# Patient Record
Sex: Female | Born: 1995
Health system: Southern US, Community
[De-identification: ages and names within clinical notes are randomized; demographics above are authoritative.]

## PROBLEM LIST (undated history)

## (undated) ENCOUNTER — Inpatient Hospital Stay (HOSPITAL_COMMUNITY): Payer: Self-pay

## (undated) DIAGNOSIS — R519 Headache, unspecified: Secondary | ICD-10-CM

## (undated) DIAGNOSIS — E1110 Type 2 diabetes mellitus with ketoacidosis without coma: Secondary | ICD-10-CM

## (undated) DIAGNOSIS — E1065 Type 1 diabetes mellitus with hyperglycemia: Secondary | ICD-10-CM

## (undated) DIAGNOSIS — E109 Type 1 diabetes mellitus without complications: Secondary | ICD-10-CM

## (undated) DIAGNOSIS — I10 Essential (primary) hypertension: Secondary | ICD-10-CM

## (undated) DIAGNOSIS — F419 Anxiety disorder, unspecified: Secondary | ICD-10-CM

## (undated) DIAGNOSIS — F32A Depression, unspecified: Secondary | ICD-10-CM

## (undated) HISTORY — DX: Essential (primary) hypertension: I10

## (undated) HISTORY — PX: WISDOM TOOTH EXTRACTION: SHX21

---
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
## 2018-05-15 ENCOUNTER — Inpatient Hospital Stay (EMERGENCY_DEPARTMENT_HOSPITAL)
Admission: AD | Admit: 2018-05-15 | Discharge: 2018-05-15 | Disposition: A | Discharge status: Home / Self Care | Payer: Medicaid Other | Source: Ambulatory Visit | Attending: Obstetrics & Gynecology | Admitting: Obstetrics & Gynecology

## 2018-05-15 ENCOUNTER — Emergency Department (HOSPITAL_COMMUNITY)
Admission: EM | Admit: 2018-05-15 | Discharge: 2018-05-15 | Disposition: A | Discharge status: Home / Self Care | Payer: Medicaid Other | Attending: Emergency Medicine | Admitting: Emergency Medicine

## 2018-05-15 ENCOUNTER — Other Ambulatory Visit: Payer: Self-pay

## 2018-05-15 ENCOUNTER — Encounter (HOSPITAL_COMMUNITY): Payer: Self-pay | Admitting: *Deleted

## 2018-05-15 ENCOUNTER — Encounter (HOSPITAL_COMMUNITY): Payer: Self-pay | Admitting: Emergency Medicine

## 2018-05-15 DIAGNOSIS — Z5321 Procedure and treatment not carried out due to patient leaving prior to being seen by health care provider: Secondary | ICD-10-CM | POA: Insufficient documentation

## 2018-05-15 DIAGNOSIS — O9989 Other specified diseases and conditions complicating pregnancy, childbirth and the puerperium: Secondary | ICD-10-CM

## 2018-05-15 DIAGNOSIS — J029 Acute pharyngitis, unspecified: Secondary | ICD-10-CM | POA: Insufficient documentation

## 2018-05-15 DIAGNOSIS — R05 Cough: Secondary | ICD-10-CM

## 2018-05-15 DIAGNOSIS — Z794 Long term (current) use of insulin: Secondary | ICD-10-CM

## 2018-05-15 DIAGNOSIS — E109 Type 1 diabetes mellitus without complications: Secondary | ICD-10-CM | POA: Insufficient documentation

## 2018-05-15 DIAGNOSIS — O99513 Diseases of the respiratory system complicating pregnancy, third trimester: Secondary | ICD-10-CM | POA: Insufficient documentation

## 2018-05-15 DIAGNOSIS — B9789 Other viral agents as the cause of diseases classified elsewhere: Secondary | ICD-10-CM

## 2018-05-15 DIAGNOSIS — O24013 Pre-existing diabetes mellitus, type 1, in pregnancy, third trimester: Secondary | ICD-10-CM

## 2018-05-15 DIAGNOSIS — Z3A29 29 weeks gestation of pregnancy: Secondary | ICD-10-CM | POA: Insufficient documentation

## 2018-05-15 DIAGNOSIS — J069 Acute upper respiratory infection, unspecified: Secondary | ICD-10-CM | POA: Diagnosis not present

## 2018-05-15 HISTORY — DX: Type 1 diabetes mellitus without complications: E10.9

## 2018-05-15 MED ORDER — GUAIFENESIN 200 MG PO TABS: 200.0000 mg | ORAL_TABLET | ORAL | 0 refills | Status: DC | PRN

## 2018-05-15 MED ORDER — FLUTICASONE PROPIONATE 50 MCG/ACT NA SUSP: 2.0000 | Freq: Every day | NASAL | 0 refills | Status: DC

## 2018-05-15 NOTE — ED Notes (Signed)
Called pt x2 for vitals, no response. °

## 2018-05-15 NOTE — MAU Note (Signed)
PT SAYS  SHE HAS BAD COUGH - STARTED LAST NIGHT-  WITH MUCUS- THEN VOMITED. ONLY  HAPPENS AT NIGHT  HAD SORE THROAT.     SHE MOVED FROM- [place] ON 8[place]11- [facility]- [facility] - DID NOT BRING RECORDS    SHE WENT TO [facility] AT 0100- VS- THEN SHE LEFT  [facility] AFTER TRIAGE.    [facility]

## 2018-05-15 NOTE — Discharge Instructions (Signed)
I think your cough, sore throat, and mucus are related to a viral URI. I have prescribed Guaifenesin which helps to thin out mucus production and Flonase, a nasal spray, that helps with congestion and dripping of mucus down your throat. Other medications that you can try that are safe in pregnancy are alcohol free Robitussin cough syrup, benadryl, claritin. I also recommend cough drops or numbing sprays for the back of your throat. Make sure you drink plenty of water.   Please return if you have fevers, worsening of your cough, cough lasting more than 4 weeks, or were feeling better and then all of the sudden feel a lot worse.

## 2018-05-15 NOTE — MAU Provider Note (Signed)
  History     CSN: [id number]  Arrival date and time: 05/15/18 [id number]   None     Chief Complaint  Patient presents with  . Cough   HPI   Megan Silva is a 22 y.o. G1P0 female at 5754w4d who presents to MAU with cough. Cough started 2 nights ago. Reports that cough is non-productive but feels like there is mucus in her throat she can't get up. Denies fevers, trouble breathing, wheezing. Endorses sore throat and nasal congestion. No known sick contacts. Has only taken Tylenol because she was unsure what else she could take in pregnancy.   [place] Pregnancy complicated by T1DM on insulin. She has a new OB visit scheduled.    OB History    Gravida  1   Para      Term      Preterm      AB      Living        SAB      TAB      Ectopic      Multiple      Live Births              Past Medical History:  Diagnosis Date  . Diabetes type 1, controlled (HCC)     History reviewed. No pertinent surgical history.  History reviewed. No pertinent family history.  Social History   Tobacco Use  . Smoking status: Never Smoker  . Smokeless tobacco: Never Used  Substance Use Topics  . Alcohol use: Never    Frequency: Never  . Drug use: Never    Allergies: No Known Allergies  No medications prior to admission.    Review of Systems Physical Exam   Blood pressure 134/82, pulse 99, temperature 98 F (36.7 C), temperature source Oral, resp. rate 20, height 5\' 7"  (1.702 m), weight 83.7 kg, SpO2 100 %.  Physical Exam  Constitutional: She is oriented to person, place, and time. She appears well-developed and well-nourished. No distress.  HENT:  Head: Normocephalic and atraumatic.  Oropharynx mildly erythematous but without tonsillar exudate or hypertrophy. TMs normal bilaterally. No rhinorrhea present.   Eyes: Conjunctivae and EOM are normal.  Neck: Normal range of motion.  Cardiovascular: Normal rate, regular rhythm and normal heart  sounds.  Respiratory: Effort normal and breath sounds normal. No respiratory distress. She has no wheezes. She has no rales.  No cough overheard during visit.   Musculoskeletal: Normal range of motion.  Neurological: She is alert and oriented to person, place, and time.  Skin: Skin is warm and dry.  Psychiatric: She has a normal mood and affect. Her behavior is normal.  FHT: 140 bpm, mod variability, +acels, no decels  Toco: Uterine irritability    MAU Course  Procedures  MDM NST reactive    Assessment and Plan   1. Viral URI with cough   Symptoms and presentation consistent with mild viral URI. Lungs clear; cough likely related to postnasal drip. No evidence of strep pharyngitis on exam. Have given prescription for Guaifenesin and Flonase. Discussed other medications appropriate for symptoms in pregnancy. Reviewed return precautions for secondary bacterial infection.      De HollingsheadCatherine L Kourtlyn Charlet 05/15/2018, 8:19 AM

## 2018-05-15 NOTE — ED Notes (Signed)
Pt at [facility], will elope

## 2018-05-15 NOTE — ED Triage Notes (Signed)
Pt reports a productive cough with yellow/green mucous that started last night. Pt reports having a sore throat over the last few days that has gotten better. Pt denies sob and denies pain. Pt is [redacted] weeks pregnant.

## 2018-05-27 ENCOUNTER — Other Ambulatory Visit: Payer: Self-pay | Admitting: Obstetrics and Gynecology

## 2018-05-27 ENCOUNTER — Other Ambulatory Visit: Payer: Self-pay | Admitting: Obstetrics

## 2018-05-27 ENCOUNTER — Encounter: Payer: Self-pay | Admitting: Obstetrics and Gynecology

## 2018-05-27 ENCOUNTER — Ambulatory Visit (INDEPENDENT_AMBULATORY_CARE_PROVIDER_SITE_OTHER): Payer: Medicaid Other | Admitting: Obstetrics and Gynecology

## 2018-05-27 DIAGNOSIS — R8271 Bacteriuria: Secondary | ICD-10-CM | POA: Insufficient documentation

## 2018-05-27 DIAGNOSIS — Z23 Encounter for immunization: Secondary | ICD-10-CM

## 2018-05-27 DIAGNOSIS — O24913 Unspecified diabetes mellitus in pregnancy, third trimester: Secondary | ICD-10-CM

## 2018-05-27 DIAGNOSIS — O0993 Supervision of high risk pregnancy, unspecified, third trimester: Secondary | ICD-10-CM | POA: Diagnosis not present

## 2018-05-27 DIAGNOSIS — O24919 Unspecified diabetes mellitus in pregnancy, unspecified trimester: Secondary | ICD-10-CM | POA: Insufficient documentation

## 2018-05-27 DIAGNOSIS — O24013 Pre-existing diabetes mellitus, type 1, in pregnancy, third trimester: Secondary | ICD-10-CM | POA: Diagnosis not present

## 2018-05-27 DIAGNOSIS — O99824 Streptococcus B carrier state complicating childbirth: Secondary | ICD-10-CM

## 2018-05-27 HISTORY — DX: Bacteriuria: R82.71

## 2018-05-27 LAB — COMPREHENSIVE METABOLIC PANEL
A/G RATIO: 1.2 (ref 1.2–2.2)
ALBUMIN: 3.4 g/dL — AB (ref 3.5–5.5)
ALT: 11 IU/L (ref 0–32)
AST: 15 IU/L (ref 0–40)
Alkaline Phosphatase: 90 IU/L (ref 39–117)
BILIRUBIN TOTAL: 0.3 mg/dL (ref 0.0–1.2)
BUN / CREAT RATIO: 11 (ref 9–23)
BUN: 7 mg/dL (ref 6–20)
CALCIUM: 9.2 mg/dL (ref 8.7–10.2)
CHLORIDE: 100 mmol/L (ref 96–106)
CO2: 22 mmol/L (ref 20–29)
Creatinine, Ser: 0.63 mg/dL (ref 0.57–1.00)
GFR, EST AFRICAN AMERICAN: 149 mL/min/{1.73_m2} (ref >59)
GFR, EST NON AFRICAN AMERICAN: 130 mL/min/{1.73_m2} (ref >59)
GLOBULIN, TOTAL: 2.9 g/dL (ref 1.5–4.5)
Glucose: 253 mg/dL — ABNORMAL HIGH (ref 65–99)
POTASSIUM: 4.4 mmol/L (ref 3.5–5.2)
Sodium: 136 mmol/L (ref 134–144)
TOTAL PROTEIN: 6.3 g/dL (ref 6.0–8.5)

## 2018-05-27 LAB — POCT URINALYSIS DIP (DEVICE)
BILIRUBIN URINE: NEGATIVE
Glucose, UA: 500 mg/dL — AB
HGB URINE DIPSTICK: NEGATIVE
Ketones, ur: NEGATIVE mg/dL
LEUKOCYTES UA: NEGATIVE
Nitrite: NEGATIVE
Protein, ur: 30 mg/dL — AB
Specific Gravity, Urine: 1.025 (ref 1.005–1.030)
Urobilinogen, UA: 1 mg/dL (ref 0.0–1.0)
pH: 6.5 (ref 5.0–8.0)

## 2018-05-27 LAB — HEMOGLOBIN A1C
Est. average glucose Bld gHb Est-mCnc: 206 mg/dL
Hgb A1c MFr Bld: 8.8 % — ABNORMAL HIGH (ref 4.8–5.6)

## 2018-05-27 MED ORDER — INSULIN LISPRO 100 UNIT/ML ~~LOC~~ SOLN: 14.0000 [IU] | Freq: Three times a day (TID) | SUBCUTANEOUS | 5 refills | Status: DC

## 2018-05-27 MED ORDER — "INSULIN SYRINGE 31G X 5/16"" 0.3 ML MISC": 1.0000 | Freq: Every day | 3 refills | Status: DC

## 2018-05-27 MED ORDER — INSULIN GLARGINE 100 UNIT/ML ~~LOC~~ SOLN: 36.0000 [IU] | Freq: Two times a day (BID) | SUBCUTANEOUS | 5 refills | Status: DC

## 2018-05-27 NOTE — Telephone Encounter (Signed)
Refills requested for insulin, but she has 5 refills from Dr. Rande Lawman.  Call patient and let her know.

## 2018-05-27 NOTE — Progress Notes (Signed)
Subjective:  Megan Silva is a 22 y.o. G1P0000 at 62w2dbeing seen today for ongoing prenatal care.[place] No prenatal care the month of August. Prenatal course complicated by Type 1 DM. Dx at age 22 Pt reports at least 5 hospitalization for glucose control thus far in pregnancy. She reports taking her insulin and following diet. Also H/O + GBS urine and abnormal genetic screening test for increased DS risk. Pt does not recall any specifics but reports declining genetic counseling. She reports having had a fetal ECHO and says was WNL.  Prenatal records are not available presently.   She is currently monitored for the following issues for this high-risk pregnancy and has Supervision of high risk pregnancy, antepartum, third trimester; Diabetes mellitus complicating pregnancy; and GBS bacteriuria on their problem list.  Patient reports no complaints.  Contractions: Not present. Vag. Bleeding: None.  Movement: Present. Denies leaking of fluid.   The following portions of the patient's history were reviewed and updated as appropriate: allergies, current medications, past family history, past medical history, past social history, past surgical history and problem list. Problem list updated.  Objective:   Vitals:   05/27/18 0951  BP: 126/65  Pulse: (!) 101  Weight: 183 lb (83 kg)    Fetal Status: Fetal Heart Rate (bpm): 150   Movement: Present     General:  Alert, oriented and cooperative. Patient is in no acute distress.  Skin: Skin is warm and dry. No rash noted.   Cardiovascular: Normal heart rate noted  Respiratory: Normal respiratory effort, no problems with respiration noted  Abdomen: Soft, gravid, appropriate for gestational age. Pain/Pressure: Present     Pelvic:  Cervical exam deferred        Extremities: Normal range of motion.  Edema: None  Mental Status: Normal mood and affect. Normal behavior. Normal judgment and thought content.    Urinalysis:      Assessment and Plan:  Pregnancy: G1P0000 at 34w2d1. Supervision of high risk pregnancy, antepartum, third trimester Will have pt sign for release of medical records. - Tdap vaccine greater than or equal to 7yo IM - Flu Vaccine QUAD 36+ mos IM  2. Diabetes mellitus affecting pregnancy in third trimester DM and pregnancy reviewed with pt. Importance for glycemic control reviewed with pt and that glycemic control deceases risks associated with DM and pregnancy Review of CBG's demonstrated not in goal range. Will increase insulin therapy as note in med list. Insulin and glucose supplies refilled. Offered DM education/nutrition but pt declined Antenatal testing discussed and indications reviewed Will begin twice weekly testing with BBP/NST Will also check growth U?S and additional labs ordered today Will continue with BASA qd.  - POCT urinalysis dip (device) - Comp Met (CMET) - Protein / creatinine ratio, urine - [place] OB DETAIL +14 WK; Future - [place] FETAL BPP WO NON STRESS; Future - Hemoglobin A1c - insulin glargine (LANTUS) 100 UNIT/ML injection; Inject 0.36 mLs (36 Units total) into the skin 2 (two) times daily.  Dispense: 10 mL; Refill: 5 - insulin lispro (HUMALOG) 100 UNIT/ML injection; Inject 0.14 mLs (14 Units total) into the skin 3 (three) times daily before meals.  Dispense: 10 mL; Refill: 5  3. GBS bacteriuria TX while in labor  Preterm labor symptoms and general obstetric precautions including but not limited to vaginal bleeding, contractions, leaking of fluid and fetal movement were reviewed in detail with the patient. Please refer to  After Visit Summary for other counseling recommendations.  Return in about 1 week (around 06/03/2018) for OB visit.   Chancy Milroy, MD

## 2018-05-27 NOTE — Addendum Note (Signed)
Addended by: Kathee Delton on: 05/27/2018 01:16 PM   Modules accepted: Orders

## 2018-05-27 NOTE — BH Specialist Note (Deleted)
Integrated Behavioral Health Initial Visit  MRN: [id number] Name: Megan Silva  Number of Integrated Behavioral Health Clinician visits:: 1/6 Session Start time: ***  Session End time: *** Total time: {IBH Total Time:21014050}  Type of Service: Integrated Behavioral Health- Individual/Family Interpretor:No. Interpretor Name and Language: n/a   Warm Hand Off Completed.       SUBJECTIVE: Megan Silva is a 22 y.o. female accompanied by {CHL AMB ACCOMPANIED XT:0240973532} Patient was referred by Nettie Elm, MD for Initial OB introduction to integrated behavioral health services . Patient reports the following symptoms/concerns: *** Duration of problem: ***; Severity of problem: {Mild/Moderate/Severe:20260}  OBJECTIVE: Mood: {BHH MOOD:22306} and Affect: {BHH AFFECT:22307} Risk of harm to self or others: {CHL AMB BH Suicide Current Mental Status:21022748}  LIFE CONTEXT: Family and Social: *** School/Work: *** Self-Care: *** Life Changes: Current pregnancy ***  GOALS ADDRESSED: Patient will: 1. Reduce symptoms of: {IBH Symptoms:21014056} 2. Increase knowledge and/or ability of: {IBH Patient Tools:21014057}  3. Demonstrate ability to: {IBH Goals:21014053}  INTERVENTIONS: Interventions utilized: {IBH Interventions:21014054}  Standardized Assessments completed: GAD-7 and PHQ 9 ***  ASSESSMENT: Patient currently experiencing Supervision of *** pregnancy ***.   Patient may benefit from Initial OB introduction to integrated behavioral health services .  PLAN: 1. Follow up with behavioral health clinician on : *** 2. Behavioral recommendations: *** 3. Referral(s): {IBH Referrals:21014055} 4. "From scale of 1-10, how likely are you to follow plan?": ***  Valetta Close Gibran Veselka, LCSW

## 2018-05-27 NOTE — Patient Instructions (Signed)
Third Trimester of Pregnancy The third trimester is from week 28 through week 40 (months 7 through 9). The third trimester is a time when the unborn baby (fetus) is growing rapidly. At the end of the ninth month, the fetus is about 20 inches in length and weighs 6-10 pounds. Body changes during your third trimester Your body will continue to go through many changes during pregnancy. The changes vary from woman to woman. During the third trimester:  Your weight will continue to increase. You can expect to gain 25-35 pounds (11-16 kg) by the end of the pregnancy.  You may begin to get stretch marks on your hips, abdomen, and breasts.  You may urinate more often because the fetus is moving lower into your pelvis and pressing on your bladder.  You may develop or continue to have heartburn. This is caused by increased hormones that slow down muscles in the digestive tract.  You may develop or continue to have constipation because increased hormones slow digestion and cause the muscles that push waste through your intestines to relax.  You may develop hemorrhoids. These are swollen veins (varicose veins) in the rectum that can itch or be painful.  You may develop swollen, bulging veins (varicose veins) in your legs.  You may have increased body aches in the pelvis, back, or thighs. This is due to weight gain and increased hormones that are relaxing your joints.  You may have changes in your hair. These can include thickening of your hair, rapid growth, and changes in texture. Some women also have hair loss during or after pregnancy, or hair that feels dry or thin. Your hair will most likely return to normal after your baby is born.  Your breasts will continue to grow and they will continue to become tender. A yellow fluid (colostrum) may leak from your breasts. This is the first milk you are producing for your baby.  Your belly button may stick out.  You may notice more swelling in your hands,  face, or ankles.  You may have increased tingling or numbness in your hands, arms, and legs. The skin on your belly may also feel numb.  You may feel short of breath because of your expanding uterus.  You may have more problems sleeping. This can be caused by the size of your belly, increased need to urinate, and an increase in your body's metabolism.  You may notice the fetus "dropping," or moving lower in your abdomen (lightening).  You may have increased vaginal discharge.  You may notice your joints feel loose and you may have pain around your pelvic bone.  What to expect at prenatal visits You will have prenatal exams every 2 weeks until week 36. Then you will have weekly prenatal exams. During a routine prenatal visit:  You will be weighed to make sure you and the baby are growing normally.  Your blood pressure will be taken.  Your abdomen will be measured to track your baby's growth.  The fetal heartbeat will be listened to.  Any test results from the previous visit will be discussed.  You may have a cervical check near your due date to see if your cervix has softened or thinned (effaced).  You will be tested for Group B streptococcus. This happens between 35 and 37 weeks.  Your health care provider may ask you:  What your birth plan is.  How you are feeling.  If you are feeling the baby move.  If you have had   any abnormal symptoms, such as leaking fluid, bleeding, severe headaches, or abdominal cramping.  If you are using any tobacco products, including cigarettes, chewing tobacco, and electronic cigarettes.  If you have any questions.  Other tests or screenings that may be performed during your third trimester include:  Blood tests that check for low iron levels (anemia).  Fetal testing to check the health, activity level, and growth of the fetus. Testing is done if you have certain medical conditions or if there are problems during the  pregnancy.  Nonstress test (NST). This test checks the health of your baby to make sure there are no signs of problems, such as the baby not getting enough oxygen. During this test, a belt is placed around your belly. The baby is made to move, and its heart rate is monitored during movement.  What is false labor? False labor is a condition in which you feel small, irregular tightenings of the muscles in the womb (contractions) that usually go away with rest, changing position, or drinking water. These are called Braxton Hicks contractions. Contractions may last for hours, days, or even weeks before true labor sets in. If contractions come at regular intervals, become more frequent, increase in intensity, or become painful, you should see your health care provider. What are the signs of labor?  Abdominal cramps.  Regular contractions that start at 10 minutes apart and become stronger and more frequent with time.  Contractions that start on the top of the uterus and spread down to the lower abdomen and back.  Increased pelvic pressure and dull back pain.  A watery or bloody mucus discharge that comes from the vagina.  Leaking of amniotic fluid. This is also known as your "water breaking." It could be a slow trickle or a gush. Let your health care provider know if it has a color or strange odor. If you have any of these signs, call your health care provider right away, even if it is before your due date. Follow these instructions at home: Medicines  Follow your health care provider's instructions regarding medicine use. Specific medicines may be either safe or unsafe to take during pregnancy.  Take a prenatal vitamin that contains at least 600 micrograms (mcg) of folic acid.  If you develop constipation, try taking a stool softener if your health care provider approves. Eating and drinking  Eat a balanced diet that includes fresh fruits and vegetables, whole grains, good sources of protein  such as meat, eggs, or tofu, and low-fat dairy. Your health care provider will help you determine the amount of weight gain that is right for you.  Avoid raw meat and uncooked cheese. These carry germs that can cause birth defects in the baby.  If you have low calcium intake from food, talk to your health care provider about whether you should take a daily calcium supplement.  Eat four or five small meals rather than three large meals a day.  Limit foods that are high in fat and processed sugars, such as fried and sweet foods.  To prevent constipation: ? Drink enough fluid to keep your urine clear or pale yellow. ? Eat foods that are high in fiber, such as fresh fruits and vegetables, whole grains, and beans. Activity  Exercise only as directed by your health care provider. Most women can continue their usual exercise routine during pregnancy. Try to exercise for 30 minutes at least 5 days a week. Stop exercising if you experience uterine contractions.  Avoid heavy   lifting.  Do not exercise in extreme heat or humidity, or at high altitudes.  Wear low-heel, comfortable shoes.  Practice good posture.  You may continue to have sex unless your health care provider tells you otherwise. Relieving pain and discomfort  Take frequent breaks and rest with your legs elevated if you have leg cramps or low back pain.  Take warm sitz baths to soothe any pain or discomfort caused by hemorrhoids. Use hemorrhoid cream if your health care provider approves.  Wear a good support bra to prevent discomfort from breast tenderness.  If you develop varicose veins: ? Wear support pantyhose or compression stockings as told by your healthcare provider. ? Elevate your feet for 15 minutes, 3-4 times a day. Prenatal care  Write down your questions. Take them to your prenatal visits.  Keep all your prenatal visits as told by your health care provider. This is important. Safety  Wear your seat belt at  all times when driving.  Make a list of emergency phone numbers, including numbers for family, friends, the hospital, and police and fire departments. General instructions  Avoid cat litter boxes and soil used by cats. These carry germs that can cause birth defects in the baby. If you have a cat, ask someone to clean the litter box for you.  Do not travel far distances unless it is absolutely necessary and only with the approval of your health care provider.  Do not use hot tubs, steam rooms, or saunas.  Do not drink alcohol.  Do not use any products that contain nicotine or tobacco, such as cigarettes and e-cigarettes. If you need help quitting, ask your health care provider.  Do not use any medicinal herbs or unprescribed drugs. These chemicals affect the formation and growth of the baby.  Do not douche or use tampons or scented sanitary pads.  Do not cross your legs for long periods of time.  To prepare for the arrival of your baby: ? Take prenatal classes to understand, practice, and ask questions about labor and delivery. ? Make a trial run to the hospital. ? Visit the hospital and tour the maternity area. ? Arrange for maternity or paternity leave through employers. ? Arrange for family and friends to take care of pets while you are in the hospital. ? Purchase a rear-facing car seat and make sure you know how to install it in your car. ? Pack your hospital bag. ? Prepare the baby's nursery. Make sure to remove all pillows and stuffed animals from the baby's crib to prevent suffocation.  Visit your dentist if you have not gone during your pregnancy. Use a soft toothbrush to brush your teeth and be gentle when you floss. Contact a health care provider if:  You are unsure if you are in labor or if your water has broken.  You become dizzy.  You have mild pelvic cramps, pelvic pressure, or nagging pain in your abdominal area.  You have lower back pain.  You have persistent  nausea, vomiting, or diarrhea.  You have an unusual or bad smelling vaginal discharge.  You have pain when you urinate. Get help right away if:  Your water breaks before 37 weeks.  You have regular contractions less than 5 minutes apart before 37 weeks.  You have a fever.  You are leaking fluid from your vagina.  You have spotting or bleeding from your vagina.  You have severe abdominal pain or cramping.  You have rapid weight loss or weight gain.    You have shortness of breath with chest pain.  You notice sudden or extreme swelling of your face, hands, ankles, feet, or legs.  Your baby makes fewer than 10 movements in 2 hours.  You have severe headaches that do not go away when you take medicine.  You have vision changes. Summary  The third trimester is from week 28 through week 40, months 7 through 9. The third trimester is a time when the unborn baby (fetus) is growing rapidly.  During the third trimester, your discomfort may increase as you and your baby continue to gain weight. You may have abdominal, leg, and back pain, sleeping problems, and an increased need to urinate.  During the third trimester your breasts will keep growing and they will continue to become tender. A yellow fluid (colostrum) may leak from your breasts. This is the first milk you are producing for your baby.  False labor is a condition in which you feel small, irregular tightenings of the muscles in the womb (contractions) that eventually go away. These are called Braxton Hicks contractions. Contractions may last for hours, days, or even weeks before true labor sets in.  Signs of labor can include: abdominal cramps; regular contractions that start at 10 minutes apart and become stronger and more frequent with time; watery or bloody mucus discharge that comes from the vagina; increased pelvic pressure and dull back pain; and leaking of amniotic fluid. This information is not intended to replace advice  given to you by your health care provider. Make sure you discuss any questions you have with your health care provider. Document Released: 08/29/2001 Document Revised: 02/10/2016 Document Reviewed: 11/05/2012 Elsevier Interactive Patient Education  2017 [employer]  

## 2018-05-28 ENCOUNTER — Other Ambulatory Visit: Payer: Self-pay | Admitting: Obstetrics and Gynecology

## 2018-05-28 DIAGNOSIS — O24913 Unspecified diabetes mellitus in pregnancy, third trimester: Secondary | ICD-10-CM

## 2018-05-28 LAB — PROTEIN / CREATININE RATIO, URINE
Creatinine, Urine: 186.2 mg/dL
PROTEIN/CREAT RATIO: 204 mg/g{creat} — AB (ref 0–200)
Protein, Ur: 38 mg/dL

## 2018-05-29 ENCOUNTER — Ambulatory Visit (HOSPITAL_COMMUNITY)
Admission: RE | Admit: 2018-05-29 | Discharge: 2018-05-29 | Disposition: A | Discharge status: Home / Self Care | Payer: Medicaid Other | Source: Ambulatory Visit | Attending: Obstetrics and Gynecology | Admitting: Obstetrics and Gynecology

## 2018-05-29 ENCOUNTER — Telehealth: Payer: Self-pay

## 2018-05-29 ENCOUNTER — Encounter (HOSPITAL_COMMUNITY): Payer: Self-pay

## 2018-05-29 ENCOUNTER — Other Ambulatory Visit (HOSPITAL_COMMUNITY): Payer: Self-pay | Admitting: *Deleted

## 2018-05-29 DIAGNOSIS — Z3A31 31 weeks gestation of pregnancy: Secondary | ICD-10-CM | POA: Insufficient documentation

## 2018-05-29 DIAGNOSIS — Z363 Encounter for antenatal screening for malformations: Secondary | ICD-10-CM | POA: Diagnosis present

## 2018-05-29 DIAGNOSIS — O0993 Supervision of high risk pregnancy, unspecified, third trimester: Secondary | ICD-10-CM

## 2018-05-29 DIAGNOSIS — E109 Type 1 diabetes mellitus without complications: Secondary | ICD-10-CM | POA: Insufficient documentation

## 2018-05-29 DIAGNOSIS — O24013 Pre-existing diabetes mellitus, type 1, in pregnancy, third trimester: Secondary | ICD-10-CM | POA: Insufficient documentation

## 2018-05-29 DIAGNOSIS — Z794 Long term (current) use of insulin: Secondary | ICD-10-CM | POA: Insufficient documentation

## 2018-05-29 DIAGNOSIS — O24913 Unspecified diabetes mellitus in pregnancy, third trimester: Secondary | ICD-10-CM

## 2018-05-29 NOTE — Telephone Encounter (Signed)
Received notification from Dr. Alysia Penna about pt's Rx for insulin.  Contacted [facility] pharmacy and was informed that the pt has [facility] which will not cover her medications.  LM for pt to return call back to the office.  Re:We need to verify if she has inquired about transferring insurance to [facility] and if she was able to get her insulin.

## 2018-05-29 NOTE — Telephone Encounter (Signed)
Attempted to contact pt unable to LM due to VM box not set up.  MyChart message sent.

## 2018-05-31 ENCOUNTER — Telehealth: Payer: Self-pay

## 2018-05-31 DIAGNOSIS — O24913 Unspecified diabetes mellitus in pregnancy, third trimester: Secondary | ICD-10-CM

## 2018-05-31 MED ORDER — "PEN NEEDLES 5/16"" 31G X 8 MM MISC": 1.0000 | Freq: Four times a day (QID) | 6 refills | Status: DC

## 2018-05-31 NOTE — Telephone Encounter (Signed)
Pt called needing her GDM needles sent to her pharmacy, advised patient that I would have those sent to her pharmacy. Pt verbalized understanding and had no more questions.

## 2018-06-04 ENCOUNTER — Ambulatory Visit (INDEPENDENT_AMBULATORY_CARE_PROVIDER_SITE_OTHER): Payer: Medicaid Other | Admitting: Obstetrics & Gynecology

## 2018-06-04 ENCOUNTER — Ambulatory Visit (INDEPENDENT_AMBULATORY_CARE_PROVIDER_SITE_OTHER): Payer: Medicaid Other | Admitting: *Deleted

## 2018-06-04 VITALS — BP 130/85 | HR 105 | Wt 187.2 lb

## 2018-06-04 DIAGNOSIS — O24913 Unspecified diabetes mellitus in pregnancy, third trimester: Secondary | ICD-10-CM | POA: Diagnosis present

## 2018-06-04 DIAGNOSIS — R8271 Bacteriuria: Secondary | ICD-10-CM

## 2018-06-04 DIAGNOSIS — O0993 Supervision of high risk pregnancy, unspecified, third trimester: Secondary | ICD-10-CM

## 2018-06-04 LAB — POCT URINALYSIS DIP (DEVICE)
BILIRUBIN URINE: NEGATIVE
GLUCOSE, UA: 500 mg/dL — AB
Hgb urine dipstick: NEGATIVE
Ketones, ur: NEGATIVE mg/dL
Leukocytes, UA: NEGATIVE
Nitrite: NEGATIVE
PH: 6.5 (ref 5.0–8.0)
PROTEIN: NEGATIVE mg/dL
Specific Gravity, Urine: 1.02 (ref 1.005–1.030)
Urobilinogen, UA: 1 mg/dL (ref 0.0–1.0)

## 2018-06-04 MED ORDER — INSULIN GLARGINE 100 UNIT/ML ~~LOC~~ SOLN: 36.0000 [IU] | Freq: Two times a day (BID) | SUBCUTANEOUS | 5 refills | Status: DC

## 2018-06-04 MED ORDER — "INSULIN SYRINGE 31G X 5/16"" 0.3 ML MISC": 1.0000 | Freq: Every day | 3 refills | Status: DC

## 2018-06-04 NOTE — Addendum Note (Signed)
Addended by: Allie BossierVE, Loneta Tamplin C on: 06/04/2018 10:03 AM   Modules accepted: Orders

## 2018-06-04 NOTE — Progress Notes (Signed)
Pt states she has been taking 34 units of Lantus twice daily - did not realize from last week that she needed to increase to 36 units.  US for growth and BPP scheduled tomorrow, weekly BPP currently scheduled @ [facility].

## 2018-06-04 NOTE — Addendum Note (Signed)
Addended by: Faythe CasaBELLAMY, Ramsha Lonigro M on: 06/04/2018 10:14 AM   Modules accepted: Orders

## 2018-06-04 NOTE — Progress Notes (Signed)
   PRENATAL VISIT NOTE  Subjective:  Megan Silva is a 22 y.o. G1P0000 at 5352w3d being seen today for ongoing prenatal care.  She is currently monitored for the following issues for this high-risk pregnancy and has Supervision of high risk pregnancy, antepartum, third trimester; Diabetes mellitus complicating pregnancy; and GBS bacteriuria on their problem list.  Patient reports no complaints.  Contractions: Irregular. Vag. Bleeding: None.  Movement: Present. Denies leaking of fluid.   The following portions of the patient's history were reviewed and updated as appropriate: allergies, current medications, past family history, past medical history, past social history, past surgical history and problem list. Problem list updated.  Objective:   Vitals:   06/04/18 0914  BP: 130/85  Pulse: (!) 105  Weight: 187 lb 3.2 oz (84.9 kg)    Fetal Status: Fetal Heart Rate (bpm): NST   Movement: Present     General:  Alert, oriented and cooperative. Patient is in no acute distress.  Skin: Skin is warm and dry. No rash noted.   Cardiovascular: Normal heart rate noted  Respiratory: Normal respiratory effort, no problems with respiration noted  Abdomen: Soft, gravid, appropriate for gestational age.  Pain/Pressure: Present     Pelvic: Cervical exam deferred        Extremities: Normal range of motion.     Mental Status: Normal mood and affect. Normal behavior. Normal judgment and thought content.   Assessment and Plan:  Pregnancy: G1P0000 at 6152w3d  1. Supervision of high risk pregnancy, antepartum, third trimester - she has weekly MFM BPP and q 4 weeks growth u/s  2. Diabetes mellitus affecting pregnancy in third trimester - her list of sugars show fasting ranging from 120 to 220 - I have rec'd that she increase her lantus BID from 34 to 38  3. GBS bacteriuria - treat in labor  Preterm labor symptoms and general obstetric precautions including but not limited to vaginal bleeding,  contractions, leaking of fluid and fetal movement were reviewed in detail with the patient. Please refer to After Visit Summary for other counseling recommendations.  Return in about 8 days (around 06/12/2018) for NST only; 10/2 Needs NST and HOB.  Future Appointments  Date Time Provider Department Center  06/04/2018 10:15 AM Allie Bossierove, Jionni Helming C, MD WOC-W[facility]A WOC  06/05/2018  1:45 PM WH-MFC US 2 WH-MFCUS MFC-US  06/12/2018  1:45 PM WH-MFC US 5 WH-MFCUS MFC-US  06/19/2018  1:45 PM WH-MFC US 2 WH-MFCUS MFC-US  06/26/2018  1:45 PM WH-MFC US 2 WH-MFCUS MFC-US    Allie BossierMyra C Shaneese Tait, MD

## 2018-06-05 ENCOUNTER — Ambulatory Visit (HOSPITAL_COMMUNITY)
Admission: RE | Admit: 2018-06-05 | Discharge: 2018-06-05 | Disposition: A | Discharge status: Home / Self Care | Payer: Medicaid Other | Source: Ambulatory Visit | Attending: Obstetrics and Gynecology | Admitting: Obstetrics and Gynecology

## 2018-06-05 ENCOUNTER — Encounter (HOSPITAL_COMMUNITY): Payer: Self-pay

## 2018-06-05 DIAGNOSIS — Z3A32 32 weeks gestation of pregnancy: Secondary | ICD-10-CM | POA: Insufficient documentation

## 2018-06-05 DIAGNOSIS — O0993 Supervision of high risk pregnancy, unspecified, third trimester: Secondary | ICD-10-CM

## 2018-06-05 DIAGNOSIS — Z363 Encounter for antenatal screening for malformations: Secondary | ICD-10-CM

## 2018-06-05 DIAGNOSIS — O24013 Pre-existing diabetes mellitus, type 1, in pregnancy, third trimester: Secondary | ICD-10-CM | POA: Diagnosis not present

## 2018-06-05 DIAGNOSIS — E109 Type 1 diabetes mellitus without complications: Secondary | ICD-10-CM

## 2018-06-06 LAB — OBSTETRIC PANEL, INCLUDING HIV
Basophils Absolute: 0.1 10*3/uL (ref 0.0–0.2)
Basos: 1 %
EOS (ABSOLUTE): 0.2 10*3/uL (ref 0.0–0.4)
Eos: 2 %
HEP B S AG: NEGATIVE
HIV SCREEN 4TH GENERATION: NONREACTIVE
Hematocrit: 31.4 % — ABNORMAL LOW (ref 34.0–46.6)
Hemoglobin: 9.6 g/dL — ABNORMAL LOW (ref 11.1–15.9)
Immature Grans (Abs): 0.5 10*3/uL — ABNORMAL HIGH (ref 0.0–0.1)
Immature Granulocytes: 4 %
LYMPHS ABS: 2.9 10*3/uL (ref 0.7–3.1)
Lymphs: 25 %
MCH: 22.9 pg — AB (ref 26.6–33.0)
MCHC: 30.6 g/dL — AB (ref 31.5–35.7)
MCV: 75 fL — AB (ref 79–97)
Monocytes Absolute: 1.2 10*3/uL — ABNORMAL HIGH (ref 0.1–0.9)
Monocytes: 11 %
NEUTROS ABS: 6.6 10*3/uL (ref 1.4–7.0)
Neutrophils: 57 %
PLATELETS: 292 10*3/uL (ref 150–450)
RBC: 4.19 x10E6/uL (ref 3.77–5.28)
RDW: 13.9 % (ref 12.3–15.4)
RH TYPE: POSITIVE
RPR: NONREACTIVE
Rubella Antibodies, IGG: 2.18 index (ref >0.99)
WBC: 11.5 10*3/uL — AB (ref 3.4–10.8)

## 2018-06-06 LAB — HEMOGLOBINOPATHY EVALUATION
Ferritin: 9 ng/mL — ABNORMAL LOW (ref 15–150)
HGB A: 98.1 % (ref 96.4–98.8)
HGB C: 0 %
HGB S: 0 %
Hgb A2 Quant: 1.9 % (ref 1.8–3.2)
Hgb F Quant: 0 % (ref 0.0–2.0)
Hgb Solubility: NEGATIVE
Hgb Variant: 0 %

## 2018-06-06 LAB — AB SCR+ANTIBODY ID: ANTIBODY SCREEN: POSITIVE — AB

## 2018-06-09 LAB — CULTURE, OB URINE

## 2018-06-09 LAB — URINE CULTURE, OB REFLEX

## 2018-06-10 ENCOUNTER — Encounter: Payer: Self-pay | Admitting: Obstetrics & Gynecology

## 2018-06-10 ENCOUNTER — Other Ambulatory Visit: Payer: Self-pay | Admitting: Obstetrics & Gynecology

## 2018-06-10 ENCOUNTER — Encounter: Payer: Self-pay | Admitting: *Deleted

## 2018-06-10 DIAGNOSIS — O2340 Unspecified infection of urinary tract in pregnancy, unspecified trimester: Secondary | ICD-10-CM

## 2018-06-10 DIAGNOSIS — B951 Streptococcus, group B, as the cause of diseases classified elsewhere: Secondary | ICD-10-CM | POA: Insufficient documentation

## 2018-06-10 HISTORY — DX: Streptococcus, group b, as the cause of diseases classified elsewhere: B95.1

## 2018-06-10 MED ORDER — AMPICILLIN 250 MG PO CAPS: 250.0000 mg | ORAL_CAPSULE | Freq: Four times a day (QID) | ORAL | 0 refills | Status: DC

## 2018-06-10 NOTE — Progress Notes (Unsigned)
+   GBS UTI treated with amp Will need treatment in labor also

## 2018-06-11 ENCOUNTER — Other Ambulatory Visit: Payer: Medicaid Other

## 2018-06-12 ENCOUNTER — Ambulatory Visit (INDEPENDENT_AMBULATORY_CARE_PROVIDER_SITE_OTHER): Payer: Medicaid Other | Admitting: *Deleted

## 2018-06-12 ENCOUNTER — Ambulatory Visit (HOSPITAL_COMMUNITY)
Admission: RE | Admit: 2018-06-12 | Discharge: 2018-06-12 | Disposition: A | Discharge status: Home / Self Care | Payer: Medicaid Other | Source: Ambulatory Visit | Attending: Obstetrics and Gynecology | Admitting: Obstetrics and Gynecology

## 2018-06-12 ENCOUNTER — Encounter (HOSPITAL_COMMUNITY): Payer: Self-pay

## 2018-06-12 VITALS — BP 122/73 | HR 103 | Wt 186.8 lb

## 2018-06-12 DIAGNOSIS — O24013 Pre-existing diabetes mellitus, type 1, in pregnancy, third trimester: Secondary | ICD-10-CM | POA: Diagnosis not present

## 2018-06-12 DIAGNOSIS — E109 Type 1 diabetes mellitus without complications: Secondary | ICD-10-CM

## 2018-06-12 DIAGNOSIS — O24913 Unspecified diabetes mellitus in pregnancy, third trimester: Secondary | ICD-10-CM

## 2018-06-12 DIAGNOSIS — Z3A33 33 weeks gestation of pregnancy: Secondary | ICD-10-CM | POA: Insufficient documentation

## 2018-06-12 DIAGNOSIS — B951 Streptococcus, group B, as the cause of diseases classified elsewhere: Secondary | ICD-10-CM

## 2018-06-12 DIAGNOSIS — Z363 Encounter for antenatal screening for malformations: Secondary | ICD-10-CM | POA: Diagnosis not present

## 2018-06-12 DIAGNOSIS — O0993 Supervision of high risk pregnancy, unspecified, third trimester: Secondary | ICD-10-CM

## 2018-06-12 DIAGNOSIS — O2340 Unspecified infection of urinary tract in pregnancy, unspecified trimester: Secondary | ICD-10-CM

## 2018-06-12 NOTE — Progress Notes (Signed)
Pt had BPP @ MFM today.  

## 2018-06-15 ENCOUNTER — Inpatient Hospital Stay (HOSPITAL_COMMUNITY)
Admission: AD | Admit: 2018-06-15 | Discharge: 2018-06-15 | Disposition: A | Discharge status: Home / Self Care | Payer: Medicaid Other | Source: Ambulatory Visit | Attending: Family Medicine | Admitting: Family Medicine

## 2018-06-15 ENCOUNTER — Encounter (HOSPITAL_COMMUNITY): Payer: Self-pay | Admitting: *Deleted

## 2018-06-15 DIAGNOSIS — E109 Type 1 diabetes mellitus without complications: Secondary | ICD-10-CM | POA: Diagnosis not present

## 2018-06-15 DIAGNOSIS — O9989 Other specified diseases and conditions complicating pregnancy, childbirth and the puerperium: Secondary | ICD-10-CM | POA: Diagnosis not present

## 2018-06-15 DIAGNOSIS — Z794 Long term (current) use of insulin: Secondary | ICD-10-CM | POA: Insufficient documentation

## 2018-06-15 DIAGNOSIS — K0889 Other specified disorders of teeth and supporting structures: Secondary | ICD-10-CM | POA: Diagnosis not present

## 2018-06-15 DIAGNOSIS — O24013 Pre-existing diabetes mellitus, type 1, in pregnancy, third trimester: Secondary | ICD-10-CM | POA: Diagnosis not present

## 2018-06-15 DIAGNOSIS — Z7982 Long term (current) use of aspirin: Secondary | ICD-10-CM | POA: Diagnosis not present

## 2018-06-15 DIAGNOSIS — Z3A34 34 weeks gestation of pregnancy: Secondary | ICD-10-CM | POA: Diagnosis not present

## 2018-06-15 DIAGNOSIS — K1379 Other lesions of oral mucosa: Secondary | ICD-10-CM | POA: Diagnosis present

## 2018-06-15 MED ORDER — OXYCODONE HCL 5 MG PO TABS: 5.0000 mg | ORAL_TABLET | Freq: Four times a day (QID) | ORAL | 0 refills | Status: AC | PRN

## 2018-06-15 NOTE — MAU Provider Note (Signed)
Chief Complaint: mouth pain and Nasal Congestion   First Provider Initiated Contact with Patient 06/15/18 [phone]     SUBJECTIVE HPI: Megan Silva is a 22 y.o. G1P0000 at [redacted]w[redacted]d who presents to M[facility] Admissions reporting mouth pain. Symptoms began last night. Reports pain in her left lower jaw/mouth. Can't tell if it's tooth pain or gum pain. Has used Anbesol and tylenol without relief. Does not have dentist or PCP. Some nasal congestion. Denies throat pain, ear pain, headache. No rash.  No ob complaints.   Location: mouth Quality: sore Severity: 10/10 on pain scale Duration: 1 day Timing: constant Modifying factors: eating makes worse. Meds have not helped Associated signs and symptoms: none  Past Medical History:  Diagnosis Date  . Diabetes type 1, controlled (HCC)    OB History  Gravida Para Term Preterm AB Living  1 0 0 0 0 0  SAB TAB Ectopic Multiple Live Births  0 0 0 0 0    # Outcome Date GA Lbr Len/2nd Weight Sex Delivery Anes PTL Lv  1 Current            Past Surgical History:  Procedure Laterality Date  . WISDOM TOOTH EXTRACTION     Social History   Socioeconomic History  . Marital status: Single    Spouse name: Not on file  . Number of children: Not on file  . Years of education: Not on file  . Highest education level: Not on file  Occupational History  . Not on file  Social Needs  . Financial resource strain: Not on file  . Food insecurity:    Worry: Not on file    Inability: Not on file  . Transportation needs:    Medical: Not on file    Non-medical: Not on file  Tobacco Use  . Smoking status: Never Smoker  . Smokeless tobacco: Never Used  Substance and Sexual Activity  . Alcohol use: Never    Frequency: Never  . Drug use: Never  . Sexual activity: Yes    Birth control/protection: None  Lifestyle  . Physical activity:    Days per week: Not on file    Minutes per session: Not on file  . Stress: Not on file  Relationships  . Social  connections:    Talks on phone: Not on file    Gets together: Not on file    Attends religious service: Not on file    Active member of club or organization: Not on file    Attends meetings of clubs or organizations: Not on file    Relationship status: Not on file  . Intimate partner violence:    Fear of current or ex partner: Not on file    Emotionally abused: Not on file    Physically abused: Not on file    Forced sexual activity: Not on file  Other Topics Concern  . Not on file  Social History Narrative  . Not on file   Family History  Problem Relation Age of Onset  . Diabetes Maternal Grandmother   . Cancer Paternal Grandmother    No current facility-administered medications on file prior to encounter.    Current Outpatient Medications on File Prior to Encounter  Medication Sig Dispense Refill  . ampicillin (PRINCIPEN) 250 MG capsule Take 1 capsule (250 mg total) by mouth 4 (four) times daily. 28 capsule 0  . aspirin 81 MG chewable tablet Chew by mouth daily.    . fluticasone (FLONASE) 50 MCG/ACT nasal  spray Place 2 sprays into both nostrils daily. 16 g 0  . insulin glargine (LANTUS) 100 UNIT/ML injection Inject 0.36 mLs (36 Units total) into the skin 2 (two) times daily. 10 mL 5  . insulin lispro (HUMALOG) 100 UNIT/ML injection Inject 0.14 mLs (14 Units total) into the skin 3 (three) times daily before meals. 10 mL 5  . Insulin Pen Needle (PEN NEEDLES 31GX5/16") 31G X 8 MM MISC 1 Dose by Does not apply route 4 (four) times daily. 30 each 6  . Insulin Syringe-Needle U-100 (INSULIN SYRINGE .3CC/31GX5/16") 31G X 5/16" 0.3 ML MISC 1 Syringe by Does not apply route 5 (five) times daily. 100 each 3  . Prenatal Vit-Fe Fumarate-FA (PRENATAL MULTIVITAMIN) TABS tablet Take 1 tablet by mouth daily at 12 noon.     No Known Allergies  I have reviewed patient's Past Medical Hx, Surgical Hx, Family Hx, Social Hx, medications and allergies.   Review of Systems  Constitutional: Negative.    HENT: Positive for congestion and dental problem. Negative for drooling, ear pain, facial swelling, mouth sores, sinus pain, sore throat and trouble swallowing.   Gastrointestinal: Negative.   Genitourinary: Negative.     OBJECTIVE Patient Vitals for the past 24 hrs:  BP Temp Temp src Pulse Resp SpO2 Weight  06/15/18 0943 121/76 (!) 97.5 F (36.4 C) Oral 97 16 98 % 84.8 kg   Constitutional: Well-developed, well-nourished female in no acute distress.  Cardiovascular: normal rate & rhythm, no murmur Respiratory: normal rate and effort. Lung sounds clear throughout HEENT: lower left first molar appears to have cavity. No oral lesions. No sinus tenderness. TM normal bilaterally.  MS: Extremities nontender, no edema, normal ROM Neurologic: Alert and oriented x 4.     LAB RESULTS No results found for this or any previous visit (from the past 24 hour(s)).  IMAGING No results found.  MAU COURSE Orders Placed This Encounter  Procedures  . Discharge patient   Meds ordered this encounter  Medications  . oxyCODONE (ROXICODONE) 5 MG immediate release tablet    Sig: Take 1 tablet (5 mg total) by mouth every 6 (six) hours as needed for up to 3 days for severe pain.    Dispense:  12 tablet    Refill:  0    Order Specific Question:   Supervising Provider    Answer:   Samara Snide    MDM FHT 156 by doppler VSS Pt appears to have cavity contributing to her pain, TTP w/tongue depressor. No oral lesions. No evidence of ear infection. No maxillary sinus tenderness. Will give small rx pain meds & dental letter Given resources for local dentists.   ASSESSMENT 1. Pain, dental     PLAN Discharge home in stable condition.   Allergies as of 06/15/2018   No Known Allergies     Medication List    TAKE these medications   ampicillin 250 MG capsule Commonly known as:  PRINCIPEN Take 1 capsule (250 mg total) by mouth 4 (four) times daily.   aspirin 81 MG chewable  tablet Chew by mouth daily.   fluticasone 50 MCG/ACT nasal spray Commonly known as:  FLONASE Place 2 sprays into both nostrils daily.   insulin glargine 100 UNIT/ML injection Commonly known as:  LANTUS Inject 0.36 mLs (36 Units total) into the skin 2 (two) times daily.   insulin lispro 100 UNIT/ML injection Commonly known as:  HUMALOG Inject 0.14 mLs (14 Units total) into the skin 3 (three) times daily  before meals.   INSULIN SYRINGE .3CC/31GX5/16" 31G X 5/16" 0.3 ML Misc 1 Syringe by Does not apply route 5 (five) times daily.   oxyCODONE 5 MG immediate release tablet Commonly known as:  Oxy IR/ROXICODONE Take 1 tablet (5 mg total) by mouth every 6 (six) hours as needed for up to 3 days for severe pain.   PEN NEEDLES 31GX5/16" 31G X 8 MM Misc 1 Dose by Does not apply route 4 (four) times daily.   prenatal multivitamin Tabs tablet Take 1 tablet by mouth daily at 12 noon.        Judeth Horn, NP 06/15/2018  7:22 PM

## 2018-06-15 NOTE — Discharge Instructions (Signed)
Dental Assistance:  If unable to pay or uninsured, contact: [facility]. to become qualified for the adult dental clinic. Patient must be enrolled in [facility] (uninsured, 0-200% FPL, qualifying info).  Enroll in [facility] first, then see Primary Care Physician assigned to you, the PCP makes a dental referral. [facility] will receive referral and contacts patient for appointment.  Patients with Medicaid           [address], [phone]  [facility] (Children up to 20 + Pregnant Women) - [phone]  [facility] - [address] - [address] [phone]  If unable to pay, or uninsured: contact [facility] Department [phone] in [place] - (Children only + Pregnant Women), [phone] in [facility]- Children only) to become qualified for the adult dental clinic  Must see if eligible to enroll in [facility] Marketplace before enrolling into the [facility] (exemption required) [phone] for an appointment)  [email or web address];   [phone].  If not eligible for ACA, then go by Department of Health and Human Services to see if eligible for orange card.  [address], [place] and [phone]  Once you get an orange card, you will have a Primary Care home who will then refer you to dental if needed.     Other IT consultant:   [facility] [phone] (ext [phone])   [address]  Dr. Valisa Karpel [place] - [phone]   [address]    [place] - [phone]   [address] [facility]           [address] [place], [place], [place], [address]           [phone], Ext. [phone]           2nd and 4th Thursday of the month at 6:30am (Simple extractions only - no wisdom teeth or surgery) First come/First serve -First 10 clients served           [facility] [place], [place] and [place] residents only)          [address] [place]  [place], [place], [address]           [phone]                    [facility] Health Department           [phone]          [facility] Health Department          [phone]         [facility] Health Department - [facility]          [phone]      Dental Pain Dental pain may be caused by many things, including:  Tooth decay (cavities or caries). Cavities expose the nerve of your tooth to air and hot or cold temperatures. This can cause pain or discomfort.  Abscess or infection. A dental abscess is a collection of infected pus from a bacterial infection in the inner part of the tooth (pulp). It usually occurs at the end of the tooths root.  Injury.  An unknown reason (idiopathic).  Your pain may be mild or severe. It may only occur when:  You are chewing.  You are exposed to hot or cold temperature.  You are eating or  drinking sugary foods or beverages, such as soda or candy.  Your pain may also be constant. Follow these instructions at home: Watch your dental pain for any changes. The following actions may help to lessen any discomfort that you are feeling:  Take medicines only as directed by your dentist.  If you were prescribed an antibiotic medicine, finish all of it even if you start to feel better.  Keep all follow-up visits as directed by your dentist. This is important.  Do not apply heat to the outside of your face.  Rinse your mouth or gargle with salt water if directed by your dentist. This helps with pain and swelling. ? You can make salt water by adding  tsp of salt to 1 cup of warm water.  Apply ice to the painful area of your face: ? Put ice in a plastic bag. ? Place a towel between your skin and the bag. ? Leave the ice on for 20 minutes, 2-3 times per day.  Avoid foods or drinks that cause you pain, such as: ? Very hot or very cold foods or drinks. ? Sweet or sugary foods or drinks.  Contact a health care provider  if:  Your pain is not controlled with medicines.  Your symptoms are worse.  You have new symptoms. Get help right away if:  You are unable to open your mouth.  You are having trouble breathing or swallowing.  You have a fever.  Your face, neck, or jaw is swollen. This information is not intended to replace advice given to you by your health care provider. Make sure you discuss any questions you have with your health care provider. Document Released: 09/04/2005 Document Revised: 01/13/2016 Document Reviewed: 08/31/2014 Elsevier Interactive Patient Education  [employer].

## 2018-06-15 NOTE — MAU Note (Signed)
Megan Silva is a 22 y.o. at [redacted]w[redacted]d here in MAU reporting: "excruitating" mouth pain/toothpain?, +congestion. Felt hot yesterday; didn't take temp. Denies cough. Onset of complaint: congestion 2 days ago. Mouth pain started yesterday Pain score: 10/10 Vitals:   06/15/18 0943  BP: 121/76  Pulse: 97  Resp: 16  Temp: (!) 97.5 F (36.4 C)  SpO2: 98%    FHT:156 via doppler Lab orders placed from triage: none

## 2018-06-18 NOTE — BH Specialist Note (Signed)
Integrated Behavioral Health Initial Visit  MRN: [id number] Name: Megan Silva  Number of Integrated Behavioral Health Clinician visits:: 1/6 Session Start time: 10:52  Session End time: 11:06 Total time: 15 minutes  Type of Service: Integrated Behavioral Health- Individual/Family Interpretor:No. Interpretor Name and Language: n/a   Warm Hand Off Completed.       SUBJECTIVE: Megan Silva is a 22 y.o. female accompanied by Partner/Significant Other Patient was referred by Leroy Libman, MD for Initial OB introduction to integrated behavioral health services . Patient reports the following symptoms/concerns: Pt states no particular concerns today, except for unfamiliarity with the hospital and local area.  Duration of problem: Current pregnancy; Severity of problem: mild  OBJECTIVE: Mood: Normal and Affect: Appropriate Risk of harm to self or others: No plan to harm self or others  LIFE CONTEXT: Family and Social: - School/Work: - Self-Care: - Life Changes: Current pregnancy; recent move from [place]  GOALS ADDRESSED: Patient will: 1. Increase knowledge and/or ability of: healthy habits   INTERVENTIONS: Interventions utilized: Supportive Counseling  Standardized Assessments completed: GAD-7 and PHQ 9  ASSESSMENT: Patient currently experiencing Supervision of high risk pregnancy, antepartum, third trimester    Patient may benefit from Initial OB introduction to integrated behavioral health services .  PLAN: 1. Follow up with behavioral health clinician on : As needed 2. Behavioral recommendations:  -Continue taking prenatal vitamin, as recommended by medical provider -Watch [facility] Virtual Tour at home today; share with FOB and other support persons 3. Referral(s): Integrated Behavioral Health Services (In Clinic) 4. "From scale of 1-10, how likely are you to follow plan?": 10  Rae Lips, LCSW  Depression screen [facility] 2/9 06/19/2018 06/04/2018 05/27/2018   Decreased Interest 0 0 0  Down, Depressed, Hopeless 0 0 0  PHQ - 2 Score 0 0 0  Altered sleeping 0 2 1  Tired, decreased energy 0 1 1  Change in appetite 0 0 0  Feeling bad or failure about yourself  0 0 0  Trouble concentrating 0 0 0  Moving slowly or fidgety/restless 0 0 0  Suicidal thoughts 0 0 0  PHQ-9 Score 0 3 2   GAD 7 : Generalized Anxiety Score 06/19/2018 06/04/2018 05/27/2018  Nervous, Anxious, on Edge 0 1 0  Control/stop worrying 0 0 0  Worry too much - different things 0 0 0  Trouble relaxing 0 0 0  Restless 0 0 0  Easily annoyed or irritable 1 0 -  Afraid - awful might happen 0 0 0  Total GAD 7 Score 1 1 -

## 2018-06-19 ENCOUNTER — Encounter: Payer: Self-pay | Admitting: Obstetrics and Gynecology

## 2018-06-19 ENCOUNTER — Ambulatory Visit (INDEPENDENT_AMBULATORY_CARE_PROVIDER_SITE_OTHER): Payer: Medicaid Other | Admitting: *Deleted

## 2018-06-19 ENCOUNTER — Ambulatory Visit: Payer: Self-pay

## 2018-06-19 ENCOUNTER — Ambulatory Visit (HOSPITAL_COMMUNITY): Admission: RE | Admit: 2018-06-19 | Payer: Medicaid Other | Source: Ambulatory Visit

## 2018-06-19 ENCOUNTER — Ambulatory Visit (INDEPENDENT_AMBULATORY_CARE_PROVIDER_SITE_OTHER): Payer: Medicaid Other | Admitting: Obstetrics and Gynecology

## 2018-06-19 ENCOUNTER — Ambulatory Visit: Payer: Self-pay | Admitting: Clinical

## 2018-06-19 VITALS — BP 119/71 | HR 99 | Wt 186.9 lb

## 2018-06-19 DIAGNOSIS — O24913 Unspecified diabetes mellitus in pregnancy, third trimester: Secondary | ICD-10-CM

## 2018-06-19 DIAGNOSIS — B951 Streptococcus, group B, as the cause of diseases classified elsewhere: Secondary | ICD-10-CM

## 2018-06-19 DIAGNOSIS — O0993 Supervision of high risk pregnancy, unspecified, third trimester: Secondary | ICD-10-CM

## 2018-06-19 DIAGNOSIS — O2340 Unspecified infection of urinary tract in pregnancy, unspecified trimester: Secondary | ICD-10-CM

## 2018-06-19 LAB — POCT URINALYSIS DIP (DEVICE)
GLUCOSE, UA: 500 mg/dL — AB
Hgb urine dipstick: NEGATIVE
LEUKOCYTES UA: NEGATIVE
NITRITE: NEGATIVE
Protein, ur: 30 mg/dL — AB
Specific Gravity, Urine: >=1.03 (ref 1.005–1.030)
UROBILINOGEN UA: 2 mg/dL — AB (ref 0.0–1.0)
pH: 6 (ref 5.0–8.0)

## 2018-06-19 MED ORDER — CEPHALEXIN 500 MG PO CAPS: 500.0000 mg | ORAL_CAPSULE | Freq: Four times a day (QID) | ORAL | 0 refills | Status: DC

## 2018-06-19 NOTE — Progress Notes (Signed)
   PRENATAL VISIT NOTE  Subjective:  Megan Silva is a 22 y.o. G1P0000 at [redacted]w[redacted]d being seen today for ongoing prenatal care.  She is currently monitored for the following issues for this high-risk pregnancy and has Supervision of high risk pregnancy, antepartum, third trimester; Diabetes mellitus complicating pregnancy; GBS bacteriuria; and GBS (group b Streptococcus) UTI complicating pregnancy, unspecified trimester on their problem list.  Patient reports occasional contractions.  Contractions: Irregular. Vag. Bleeding: None.  Movement: Present. Denies leaking of fluid.   The following portions of the patient's history were reviewed and updated as appropriate: allergies, current medications, past family history, past medical history, past social history, past surgical history and problem list. Problem list updated.  Objective:   Vitals:   06/19/18 0955  BP: 119/71  Pulse: 99  Weight: 186 lb 14.4 oz (84.8 kg)    Fetal Status: Fetal Heart Rate (bpm): NST   Movement: Present     General:  Alert, oriented and cooperative. Patient is in no acute distress.  Skin: Skin is warm and dry. No rash noted.   Cardiovascular: Normal heart rate noted  Respiratory: Normal respiratory effort, no problems with respiration noted  Abdomen: Soft, gravid, appropriate for gestational age.  Pain/Pressure: Present     Pelvic: Cervical exam deferred        Extremities: Normal range of motion.     Mental Status: Normal mood and affect. Normal behavior. Normal judgment and thought content.   Assessment and Plan:  Pregnancy: G1P0000 at [redacted]w[redacted]d  1. Supervision of high risk pregnancy, antepartum, third trimester   2. Diabetes mellitus affecting pregnancy in third trimester lantus BID 38 units Novolog 14 units with meals FG: 95 PP: 150/120/120 When looking at log in patients phone, CBG very labile. She has been sick the last few days and they have been very high, however some are well within range. Had FG  of 55 this am and several other low CBGs. Will keep at same dosing today  NST today reactive BPP today Cont weekly testing  3. GBS (group b Streptococcus) UTI complicating pregnancy, unspecified trimester Has not taken ampicillin yet, not in stock at pharmacy Resent keflex to pharmacy ppx in labor  Preterm labor symptoms and general obstetric precautions including but not limited to vaginal bleeding, contractions, leaking of fluid and fetal movement were reviewed in detail with the patient. Please refer to After Visit Summary for other counseling recommendations.  Return in about 1 week (around 06/26/2018) for NST only - has [place] @ 1345; 10/16 NST/BPP and HOB.  Future Appointments  Date Time Provider Department Center  06/19/2018 10:15 AM Conan Bowens, MD WOC-WOCA WOC  06/19/2018 10:40 AM Sharion Settler HEALTH CLINICIAN WOC-WOCA WOC  06/19/2018  1:45 PM WH-MFC [place] 2 WH-MFCUS MFC-US  06/26/2018  1:45 PM WH-MFC [place] 2 WH-MFCUS MFC-US    Conan Bowens, MD

## 2018-06-19 NOTE — Progress Notes (Signed)
Pt desired to have BPP in office during her scheduled visit rather than return later today to MFM. Pt informed that the ultrasound is considered a limited OB ultrasound and is not intended to be a complete ultrasound exam.  Patient also informed that the ultrasound is not being completed with the intent of assessing for fetal or placental anomalies or any pelvic abnormalities.  Explained that the purpose of today's ultrasound is to assess for presentation, BPP and amniotic ,fluid volume.  Patient acknowledges the purpose of the exam and the limitations of the study.

## 2018-06-19 NOTE — Progress Notes (Signed)
Pt had MAU visit on 9/28 due to tooth pain. Has seen the dentist following MAU visit and needs tooth extraction. UDIP today - Glucose 3+, Protein - 1+

## 2018-06-26 ENCOUNTER — Other Ambulatory Visit: Payer: Self-pay

## 2018-06-26 ENCOUNTER — Ambulatory Visit (HOSPITAL_COMMUNITY): Admission: RE | Admit: 2018-06-26 | Payer: Medicaid Other | Source: Ambulatory Visit

## 2018-07-03 ENCOUNTER — Ambulatory Visit (INDEPENDENT_AMBULATORY_CARE_PROVIDER_SITE_OTHER): Payer: Medicaid Other | Admitting: Obstetrics and Gynecology

## 2018-07-03 ENCOUNTER — Ambulatory Visit (INDEPENDENT_AMBULATORY_CARE_PROVIDER_SITE_OTHER): Payer: Medicaid Other | Admitting: *Deleted

## 2018-07-03 ENCOUNTER — Ambulatory Visit (HOSPITAL_COMMUNITY): Admission: RE | Admit: 2018-07-03 | Payer: Medicaid Other | Source: Ambulatory Visit

## 2018-07-03 ENCOUNTER — Encounter: Payer: Self-pay | Admitting: Obstetrics and Gynecology

## 2018-07-03 ENCOUNTER — Other Ambulatory Visit (HOSPITAL_COMMUNITY)
Admission: RE | Admit: 2018-07-03 | Discharge: 2018-07-03 | Disposition: A | Discharge status: Home / Self Care | Payer: Medicaid Other | Source: Ambulatory Visit | Attending: Obstetrics and Gynecology | Admitting: Obstetrics and Gynecology

## 2018-07-03 VITALS — BP 133/74 | HR 98 | Wt 197.4 lb

## 2018-07-03 DIAGNOSIS — O0993 Supervision of high risk pregnancy, unspecified, third trimester: Secondary | ICD-10-CM | POA: Insufficient documentation

## 2018-07-03 DIAGNOSIS — O2343 Unspecified infection of urinary tract in pregnancy, third trimester: Secondary | ICD-10-CM | POA: Diagnosis not present

## 2018-07-03 DIAGNOSIS — Z3A36 36 weeks gestation of pregnancy: Secondary | ICD-10-CM | POA: Diagnosis not present

## 2018-07-03 DIAGNOSIS — B951 Streptococcus, group B, as the cause of diseases classified elsewhere: Secondary | ICD-10-CM

## 2018-07-03 DIAGNOSIS — O24913 Unspecified diabetes mellitus in pregnancy, third trimester: Secondary | ICD-10-CM

## 2018-07-03 DIAGNOSIS — O2340 Unspecified infection of urinary tract in pregnancy, unspecified trimester: Secondary | ICD-10-CM

## 2018-07-03 DIAGNOSIS — O0990 Supervision of high risk pregnancy, unspecified, unspecified trimester: Secondary | ICD-10-CM | POA: Diagnosis present

## 2018-07-03 NOTE — Patient Instructions (Signed)

## 2018-07-03 NOTE — Progress Notes (Signed)
Pt states she does not have a ride to return for [place] later today @ 4pm - appt rescheduled to tomorrow @ 1245.

## 2018-07-03 NOTE — Progress Notes (Signed)
   PRENATAL VISIT NOTE  Subjective:  Megan Silva is a 22 y.o. G1P0000 at [redacted]w[redacted]d being seen today for ongoing prenatal care.  She is currently monitored for the following issues for this high-risk pregnancy and has Supervision of high risk pregnancy, antepartum, third trimester; Diabetes mellitus complicating pregnancy; GBS bacteriuria; and GBS (group b Streptococcus) UTI complicating pregnancy, unspecified trimester on their problem list.  Patient reports starting to have more contractions, she is feeling them more at night, some more painful than others but none regular and painful.  Contractions: Irritability. Vag. Bleeding: None.  Movement: Present. Denies leaking of fluid.   The following portions of the patient's history were reviewed and updated as appropriate: allergies, current medications, past family history, past medical history, past social history, past surgical history and problem list. Problem list updated.  Objective:   Vitals:   07/03/18 0948  BP: 133/74  Pulse: 98  Weight: 197 lb 6.4 oz (89.5 kg)   Fetal Status: Fetal Heart Rate (bpm): 143   Movement: Present     General:  Alert, oriented and cooperative. Patient is in no acute distress.  Skin: Skin is warm and dry. No rash noted.   Cardiovascular: Normal heart rate noted  Respiratory: Normal respiratory effort, no problems with respiration noted  Abdomen: Soft, gravid, appropriate for gestational age.  Pain/Pressure: Absent     Pelvic: Cervical exam deferred        Extremities: Normal range of motion.  Edema: None  Mental Status: Normal mood and affect. Normal behavior. Normal judgment and thought content.   Assessment and Plan:  Pregnancy: G1P0000 at [redacted]w[redacted]d  1. Supervision of high risk pregnancy, antepartum, third trimester - Culture, beta strep (group b only) - GC/Chlamydia probe amp ([place])not at [facility]  2. GBS (group b Streptococcus) UTI complicating pregnancy, unspecified trimester TOC 37 weeks ppx  in labor  3. Diabetes mellitus affecting pregnancy in third trimester lantus BID 38 units Novolog 14 units with meals FG: <90 PP: mostly < 150, occasionally 160  Patient self adjusts novolog for elevated glucose, will keep on same regimen for now NST today  BPP today Cont weekly testing  Preterm labor symptoms and general obstetric precautions including but not limited to vaginal bleeding, contractions, leaking of fluid and fetal movement were reviewed in detail with the patient. Please refer to After Visit Summary for other counseling recommendations.  Return in about 1 week (around 07/10/2018) for OB visit (MD), NST, BPP.  Future Appointments  Date Time Provider Department Center  07/03/2018 10:15 AM WOC-WOCA NST WOC-WOCA WOC  07/03/2018  4:00 PM WH-MFC [place] 3 WH-MFCUS MFC-US  07/10/2018  9:15 AM WOC-WOCA NST WOC-WOCA WOC  07/10/2018 10:15 AM [place] Bing, MD WOC-WOCA WOC  07/17/2018 10:15 AM WOC-WOCA NST WOC-WOCA WOC  07/17/2018 11:15 AM Conan Bowens, MD WOC-WOCA WOC    Conan Bowens, MD

## 2018-07-04 ENCOUNTER — Encounter (HOSPITAL_COMMUNITY): Payer: Self-pay

## 2018-07-04 ENCOUNTER — Ambulatory Visit (HOSPITAL_BASED_OUTPATIENT_CLINIC_OR_DEPARTMENT_OTHER)
Admission: RE | Admit: 2018-07-04 | Discharge: 2018-07-04 | Disposition: A | Discharge status: Home / Self Care | Payer: Medicaid Other | Source: Ambulatory Visit | Attending: Maternal and Fetal Medicine | Admitting: Maternal and Fetal Medicine

## 2018-07-04 DIAGNOSIS — E109 Type 1 diabetes mellitus without complications: Secondary | ICD-10-CM

## 2018-07-04 DIAGNOSIS — O24013 Pre-existing diabetes mellitus, type 1, in pregnancy, third trimester: Secondary | ICD-10-CM

## 2018-07-04 DIAGNOSIS — O24913 Unspecified diabetes mellitus in pregnancy, third trimester: Secondary | ICD-10-CM

## 2018-07-04 DIAGNOSIS — Z3A36 36 weeks gestation of pregnancy: Secondary | ICD-10-CM | POA: Insufficient documentation

## 2018-07-04 DIAGNOSIS — Z362 Encounter for other antenatal screening follow-up: Secondary | ICD-10-CM | POA: Diagnosis not present

## 2018-07-04 LAB — GC/CHLAMYDIA PROBE AMP (~~LOC~~) NOT AT ARMC
CHLAMYDIA, DNA PROBE: NEGATIVE
Neisseria Gonorrhea: NEGATIVE

## 2018-07-05 ENCOUNTER — Other Ambulatory Visit: Payer: Self-pay

## 2018-07-05 ENCOUNTER — Inpatient Hospital Stay (HOSPITAL_COMMUNITY)
Admission: AD | Admit: 2018-07-05 | Discharge: 2018-07-09 | DRG: 786 | Disposition: A | Discharge status: Home / Self Care | Payer: Medicaid Other | Attending: Obstetrics and Gynecology | Admitting: Obstetrics and Gynecology

## 2018-07-05 ENCOUNTER — Encounter (HOSPITAL_COMMUNITY): Payer: Self-pay

## 2018-07-05 DIAGNOSIS — Z3A36 36 weeks gestation of pregnancy: Secondary | ICD-10-CM | POA: Diagnosis not present

## 2018-07-05 DIAGNOSIS — D649 Anemia, unspecified: Secondary | ICD-10-CM | POA: Diagnosis present

## 2018-07-05 DIAGNOSIS — O99824 Streptococcus B carrier state complicating childbirth: Secondary | ICD-10-CM | POA: Diagnosis present

## 2018-07-05 DIAGNOSIS — O324XX0 Maternal care for high head at term, not applicable or unspecified: Secondary | ICD-10-CM | POA: Diagnosis present

## 2018-07-05 DIAGNOSIS — E10649 Type 1 diabetes mellitus with hypoglycemia without coma: Secondary | ICD-10-CM | POA: Diagnosis present

## 2018-07-05 DIAGNOSIS — O42913 Preterm premature rupture of membranes, unspecified as to length of time between rupture and onset of labor, third trimester: Secondary | ICD-10-CM | POA: Diagnosis present

## 2018-07-05 DIAGNOSIS — Z98891 History of uterine scar from previous surgery: Secondary | ICD-10-CM

## 2018-07-05 DIAGNOSIS — O9902 Anemia complicating childbirth: Secondary | ICD-10-CM | POA: Diagnosis present

## 2018-07-05 DIAGNOSIS — O4290 Premature rupture of membranes, unspecified as to length of time between rupture and onset of labor, unspecified weeks of gestation: Secondary | ICD-10-CM | POA: Diagnosis present

## 2018-07-05 DIAGNOSIS — Z794 Long term (current) use of insulin: Secondary | ICD-10-CM

## 2018-07-05 DIAGNOSIS — O2340 Unspecified infection of urinary tract in pregnancy, unspecified trimester: Secondary | ICD-10-CM

## 2018-07-05 DIAGNOSIS — Z3A37 37 weeks gestation of pregnancy: Secondary | ICD-10-CM | POA: Diagnosis not present

## 2018-07-05 DIAGNOSIS — O2402 Pre-existing diabetes mellitus, type 1, in childbirth: Secondary | ICD-10-CM | POA: Diagnosis present

## 2018-07-05 DIAGNOSIS — O99214 Obesity complicating childbirth: Secondary | ICD-10-CM | POA: Diagnosis present

## 2018-07-05 DIAGNOSIS — O24913 Unspecified diabetes mellitus in pregnancy, third trimester: Secondary | ICD-10-CM

## 2018-07-05 DIAGNOSIS — O0993 Supervision of high risk pregnancy, unspecified, third trimester: Secondary | ICD-10-CM

## 2018-07-05 DIAGNOSIS — B951 Streptococcus, group B, as the cause of diseases classified elsewhere: Secondary | ICD-10-CM

## 2018-07-05 DIAGNOSIS — Z9889 Other specified postprocedural states: Secondary | ICD-10-CM

## 2018-07-05 LAB — TYPE AND SCREEN
ABO/RH(D): B POS
ANTIBODY SCREEN: NEGATIVE

## 2018-07-05 LAB — CBC
HCT: 31.7 % — ABNORMAL LOW (ref 36.0–46.0)
HEMOGLOBIN: 9.8 g/dL — AB (ref 12.0–15.0)
MCH: 22.4 pg — AB (ref 26.0–34.0)
MCHC: 30.9 g/dL (ref 30.0–36.0)
MCV: 72.4 fL — ABNORMAL LOW (ref 80.0–100.0)
Platelets: 249 10*3/uL (ref 150–400)
RBC: 4.38 MIL/uL (ref 3.87–5.11)
RDW: 16 % — ABNORMAL HIGH (ref 11.5–15.5)
WBC: 9.7 10*3/uL (ref 4.0–10.5)
nRBC: 0.5 % — ABNORMAL HIGH (ref 0.0–0.2)

## 2018-07-05 LAB — POCT FERN TEST: POCT FERN TEST: POSITIVE

## 2018-07-05 LAB — GLUCOSE, CAPILLARY
GLUCOSE-CAPILLARY: 200 mg/dL — AB (ref 70–99)
Glucose-Capillary: 119 mg/dL — ABNORMAL HIGH (ref 70–99)

## 2018-07-05 LAB — ABO/RH: ABO/RH(D): B POS

## 2018-07-05 MED ORDER — FLEET ENEMA 7-19 GM/118ML RE ENEM: 1.0000 | ENEMA | RECTAL | Status: DC | PRN

## 2018-07-05 MED ORDER — OXYTOCIN BOLUS FROM INFUSION: 500.0000 mL | Freq: Once | INTRAVENOUS | Status: DC

## 2018-07-05 MED ORDER — PENICILLIN G 3 MILLION UNITS IVPB - SIMPLE MED
3.0000 10*6.[IU] | INTRAVENOUS | Status: DC
  Administered 2018-07-05 – 2018-07-06 (×2): 3 10*6.[IU] via INTRAVENOUS
  Filled 2018-07-05 (×2): qty 100

## 2018-07-05 MED ORDER — OXYTOCIN 40 UNITS IN LACTATED RINGERS INFUSION - SIMPLE MED
1.0000 m[IU]/min | INTRAVENOUS | Status: DC
  Administered 2018-07-05: 2 m[IU]/min via INTRAVENOUS
  Filled 2018-07-05: qty 1000

## 2018-07-05 MED ORDER — LACTATED RINGERS IV SOLN
INTRAVENOUS | Status: DC
  Administered 2018-07-05 – 2018-07-06 (×4): via INTRAVENOUS

## 2018-07-05 MED ORDER — SODIUM CHLORIDE 0.9 % IV SOLN
5.0000 10*6.[IU] | Freq: Once | INTRAVENOUS | Status: AC
  Administered 2018-07-05: 5 10*6.[IU] via INTRAVENOUS
  Filled 2018-07-05: qty 5

## 2018-07-05 MED ORDER — TERBUTALINE SULFATE 1 MG/ML IJ SOLN
0.2500 mg | Freq: Once | INTRAMUSCULAR | Status: DC | PRN
  Filled 2018-07-05: qty 1

## 2018-07-05 MED ORDER — SOD CITRATE-CITRIC ACID 500-334 MG/5ML PO SOLN
30.0000 mL | ORAL | Status: DC | PRN
  Filled 2018-07-05: qty 15

## 2018-07-05 MED ORDER — OXYTOCIN 40 UNITS IN LACTATED RINGERS INFUSION - SIMPLE MED: 2.5000 [IU]/h | INTRAVENOUS | Status: DC

## 2018-07-05 MED ORDER — ONDANSETRON HCL 4 MG/2ML IJ SOLN: 4.0000 mg | Freq: Four times a day (QID) | INTRAMUSCULAR | Status: DC | PRN

## 2018-07-05 MED ORDER — ACETAMINOPHEN 325 MG PO TABS: 650.0000 mg | ORAL_TABLET | ORAL | Status: DC | PRN

## 2018-07-05 MED ORDER — LACTATED RINGERS IV SOLN
500.0000 mL | INTRAVENOUS | Status: DC | PRN
  Administered 2018-07-05 – 2018-07-06 (×2): 500 mL via INTRAVENOUS

## 2018-07-05 MED ORDER — LIDOCAINE HCL (PF) 1 % IJ SOLN
30.0000 mL | INTRAMUSCULAR | Status: DC | PRN
  Filled 2018-07-05: qty 30

## 2018-07-05 MED ORDER — INSULIN ASPART 100 UNIT/ML ~~LOC~~ SOLN
0.0000 [IU] | SUBCUTANEOUS | Status: DC
  Administered 2018-07-05: 4 [IU] via SUBCUTANEOUS

## 2018-07-05 MED ORDER — OXYCODONE-ACETAMINOPHEN 5-325 MG PO TABS: 1.0000 | ORAL_TABLET | ORAL | Status: DC | PRN

## 2018-07-05 MED ORDER — OXYCODONE-ACETAMINOPHEN 5-325 MG PO TABS: 2.0000 | ORAL_TABLET | ORAL | Status: DC | PRN

## 2018-07-05 NOTE — H&P (Signed)
Megan Silva is a 22 y.o. female presenting for SROM at 1600 hrs today. Patient also reporting intermittent mild contraction pain 3-10. Denies birth plan, desired unmedicated delivery She received prenatal care at [facility]. Her pregnancy is complicated by the following:  Patient Active Problem List   Diagnosis Date Noted  . GBS (group b Streptococcus) UTI complicating pregnancy, unspecified trimester 06/10/2018  . Supervision of high risk pregnancy, antepartum, third trimester 05/27/2018  . Diabetes mellitus complicating pregnancy 05/27/2018  . GBS bacteriuria 05/27/2018   OB History    Gravida  1   Para  0   Term  0   Preterm  0   AB  0   Living  0     SAB  0   TAB  0   Ectopic  0   Multiple  0   Live Births  0          Past Medical History:  Diagnosis Date  . Diabetes type 1, controlled (HCC)    Past Surgical History:  Procedure Laterality Date  . WISDOM TOOTH EXTRACTION     Family History: family history includes Cancer in her paternal grandmother; Diabetes in her maternal grandmother. Social History:  reports that she has never smoked. She has never used smokeless tobacco. She reports that she does not drink alcohol or use drugs.   Nursing Staff Provider  Office Location  [facility] Dating    Language   Engl Anatomy US    Flu Vaccine  05/27/2018 Genetic Screen  NIPS:   AFP:   First Screen:  Quad:    TDaP vaccine   05/28/2018 Hgb A1C or  GTT  N/A Type 1 DM  Rhogam   N/A B POS   LAB RESULTS   Feeding Plan  Breast Blood Type B/Positive/-- (09/17 1022)   Contraception    OCP Antibody Positive, See Final Results (09/17 1022)  Circumcision  yes Rubella 2.18 (09/17 1022)  Pediatrician   RPR Non Reactive (09/17 1022)   Support Person  Artese HBsAg Negative (09/17 1022)   Prenatal Classes  HIV Non Reactive (09/17 1022)  BTL Consent  N/A GBS   + in urine  VBAC Consent  N/A Pap   2019 WNL    Hgb Electro      CF     SMA     Waterbirth  [ ]  Class [ ]  Consent [ ]   CNM visit      Maternal Diabetes: Yes:  Diabetes Type:  Pre-pregnancy Genetic Screening: Declined Maternal Ultrasounds/Referrals: Normal Fetal Ultrasounds or other Referrals:  None Maternal Substance Abuse:  No Significant Maternal Medications:  None Significant Maternal Lab Results:  None Other Comments:  GBS POS (PCN)  Review of Systems  Constitutional: Negative for chills and fever.  Respiratory: Negative for shortness of breath.   Cardiovascular: Negative for chest pain.  Gastrointestinal: Positive for abdominal pain. Negative for nausea and vomiting.  Genitourinary: Negative for dysuria.  Neurological: Negative for headaches.  All other systems reviewed and are negative.  Maternal Medical History:  Reason for admission: Nausea.      Weight 89.8 kg, last menstrual period 10/20/2017. Exam Physical Exam  Nursing note and vitals reviewed. Constitutional: She is oriented to person, place, and time. She appears well-developed and well-nourished.  Cardiovascular: Normal rate and intact distal pulses.  GI: There is no tenderness. There is no rebound and no guarding.  Gravid  Genitourinary:  Genitourinary Comments: MAU RN unable to reach cervix Pt declined Provider  SVE in MAU  Neurological: She is alert and oriented to person, place, and time. She has normal reflexes.  Skin: Skin is warm and dry.  Psychiatric: She has a normal mood and affect. Her behavior is normal. Judgment and thought content normal.    Prenatal labs: ABO, Rh: B/Positive/-- (09/17 1022) Antibody: Positive, See Final Results (09/17 1022) Rubella: 2.18 (09/17 1022) RPR: Non Reactive (09/17 1022)  HBsAg: Negative (09/17 1022)  HIV: Non Reactive (09/17 1022)  GBS:   POS (PCN)  Assessment/Plan: --22 y.o. G1P0000 at [redacted]w[redacted]d  --Grossly ruptured at 1600 hrs --Type 1 DM, q 4 hour CBGs until active labor --Category II tracing: baseline 150, moderate variability, positive accelerations, prolonged deceleration  x 1 at 1804 --Toco irregular contractions q 1.5-36min, palpate mild --Boy/outpt circ/breast/OCP --Planning unmedicated delivery --Admit to [employer], cervical exam s/p 6 hours from SROM at about 2200hrs, Pitocin PRN  EFW > 90% @ 4362g yesterday  Calvert Cantor, CNM 07/05/2018, 6:51 PM

## 2018-07-05 NOTE — Progress Notes (Signed)
LABOR PROGRESS NOTE  TURNER KUNZMAN is a 22 y.o. G1P0000 at [redacted]w[redacted]d  admitted for SROM.   Subjective: Strip note   Objective: BP 139/79   Pulse 90   Temp 98.9 F (37.2 C) (Axillary)   Resp 18   Ht (P) 5\' 7"  (1.702 m)   Wt 89.8 kg   LMP 10/20/2017 (Exact Date)   BMI (P) 31.01 kg/m  or  Vitals:   07/05/18 2024 07/05/18 2125 07/05/18 2157 07/05/18 2235  BP: (!) 141/86 131/78 119/67 139/79  Pulse: 92 98 (!) 102 90  Resp: 19  19 18   Temp:  98.9 F (37.2 C)    TempSrc:  Axillary    Weight:      Height:        Dilation: 2.5 Effacement (%): 50 Cervical Position: Posterior Station: -3 Presentation: Vertex Exam by:: Dr. Annia Friendly FHT: baseline rate 150, moderate varibility, +acel, -decel  Toco: every 3-4  Labs: Lab Results  Component Value Date   WBC 9.7 07/05/2018   HGB 9.8 (L) 07/05/2018   HCT 31.7 (L) 07/05/2018   MCV 72.4 (L) 07/05/2018   PLT 249 07/05/2018    Patient Active Problem List   Diagnosis Date Noted  . PROM (premature rupture of membranes) 07/05/2018  . GBS (group b Streptococcus) UTI complicating pregnancy, unspecified trimester 06/10/2018  . Supervision of high risk pregnancy, antepartum, third trimester 05/27/2018  . Diabetes mellitus complicating pregnancy 05/27/2018  . GBS bacteriuria 05/27/2018    Assessment / Plan: 22 y.o. G1P0000 at [redacted]w[redacted]d here for SROM.   Labor: IOL with pit, consideration for IUPC and infusion if continues to have variables as below  Fetal Wellbeing:  Cat 1 strip currently, previously having frequent variables- improved with position changes and given LR bolus. Will monitor closely.  Pain Control:  Natural, well controlled  Anticipated MOD:  NSVD   Leticia Penna, D.O. Family Medicine PGY-1   07/05/2018, 10:49 PM

## 2018-07-05 NOTE — Progress Notes (Addendum)
LABOR PROGRESS NOTE  TORIANNE LAFLAM is a 22 y.o. G1P0000 at [redacted]w[redacted]d  admitted for SROM.   Subjective: Doing well. Feeling some contractions, approximately every 6 min.   Objective: BP (!) 141/86   Pulse 92   Temp 98.3 F (36.8 C) (Oral)   Resp 19   Ht (P) 5\' 7"  (1.702 m)   Wt 89.8 kg   LMP 10/20/2017 (Exact Date)   BMI (P) 31.01 kg/m  or  Vitals:   07/05/18 1857 07/05/18 1928 07/05/18 2022 07/05/18 2024  BP: 131/75 136/78 (!) 141/86 (!) 141/86  Pulse: 95 99 92 92  Resp:  18 19 19   Temp:  98.3 F (36.8 C)    TempSrc:  Oral    Weight:      Height:         Dilation: 2.5 Effacement (%): 50 Cervical Position: Posterior Station: -3 Presentation: Vertex Exam by:: Dr. Annia Friendly FHT: baseline rate 155, moderate varibility, +acel, -decel Toco: irregular, every 5-7 min  Labs: Lab Results  Component Value Date   WBC 9.7 07/05/2018   HGB 9.8 (L) 07/05/2018   HCT 31.7 (L) 07/05/2018   MCV 72.4 (L) 07/05/2018   PLT 249 07/05/2018    Patient Active Problem List   Diagnosis Date Noted  . GBS (group b Streptococcus) UTI complicating pregnancy, unspecified trimester 06/10/2018  . Supervision of high risk pregnancy, antepartum, third trimester 05/27/2018  . Diabetes mellitus complicating pregnancy 05/27/2018  . GBS bacteriuria 05/27/2018    Assessment / Plan: 22 y.o. G1P0000 at [redacted]w[redacted]d here for SROM at 1600. Pregnancy complicated by T1DM and GBS positive, receiving PCN.   Labor: Will augment with pit 2x2. Serial cervical exams.   Fetal Wellbeing: Cat 1 strip currently, previously had a few late decels- will monitor closely.  Pain Control:  Well controlled, planning for unmedicated delivery  Anticipated MOD:  NSVD  1. T1DM: Stable, last CBG 200, received SSI.   -Monitor CBG q4, can transition to q2 if continues to be elevated.   Leticia Penna, D.O. Family Medicine PGY-1  07/05/2018, 8:38 PM

## 2018-07-05 NOTE — MAU Note (Addendum)
Gush of clear fluid around 1600, continues to leak. (came in with towel wrapped around hips)  No bleeding.contracting about every 7-8 min. Has not had cervix checked.  No problems with preg.

## 2018-07-06 ENCOUNTER — Inpatient Hospital Stay (HOSPITAL_COMMUNITY): Payer: Medicaid Other | Admitting: Anesthesiology

## 2018-07-06 ENCOUNTER — Encounter (HOSPITAL_COMMUNITY): Payer: Self-pay | Admitting: Anesthesiology

## 2018-07-06 ENCOUNTER — Encounter (HOSPITAL_COMMUNITY)
Admission: AD | Disposition: A | Discharge status: Home / Self Care | Payer: Self-pay | Source: Home / Self Care | Attending: Obstetrics and Gynecology

## 2018-07-06 DIAGNOSIS — Z3A37 37 weeks gestation of pregnancy: Secondary | ICD-10-CM

## 2018-07-06 DIAGNOSIS — Z9889 Other specified postprocedural states: Secondary | ICD-10-CM

## 2018-07-06 DIAGNOSIS — Z98891 History of uterine scar from previous surgery: Secondary | ICD-10-CM

## 2018-07-06 LAB — CBC
HCT: 27.6 % — ABNORMAL LOW (ref 36.0–46.0)
HEMOGLOBIN: 8.8 g/dL — AB (ref 12.0–15.0)
MCH: 22.9 pg — ABNORMAL LOW (ref 26.0–34.0)
MCHC: 31.9 g/dL (ref 30.0–36.0)
MCV: 71.7 fL — ABNORMAL LOW (ref 80.0–100.0)
NRBC: 0 % (ref 0.0–0.2)
PLATELETS: 220 10*3/uL (ref 150–400)
RBC: 3.85 MIL/uL — AB (ref 3.87–5.11)
RDW: 16 % — ABNORMAL HIGH (ref 11.5–15.5)
WBC: 16.7 10*3/uL — AB (ref 4.0–10.5)

## 2018-07-06 LAB — CREATININE, SERUM
CREATININE: 0.54 mg/dL (ref 0.44–1.00)
GFR calc Af Amer: >60 mL/min (ref >60)

## 2018-07-06 LAB — GLUCOSE, CAPILLARY
GLUCOSE-CAPILLARY: 175 mg/dL — AB (ref 70–99)
GLUCOSE-CAPILLARY: 289 mg/dL — AB (ref 70–99)
Glucose-Capillary: 106 mg/dL — ABNORMAL HIGH (ref 70–99)
Glucose-Capillary: 155 mg/dL — ABNORMAL HIGH (ref 70–99)
Glucose-Capillary: 178 mg/dL — ABNORMAL HIGH (ref 70–99)
Glucose-Capillary: 264 mg/dL — ABNORMAL HIGH (ref 70–99)

## 2018-07-06 LAB — RPR: RPR Ser Ql: NONREACTIVE

## 2018-07-06 SURGERY — Surgical Case
Anesthesia: Spinal | Wound class: Clean Contaminated

## 2018-07-06 MED ORDER — SCOPOLAMINE 1 MG/3DAYS TD PT72
MEDICATED_PATCH | TRANSDERMAL | Status: AC
  Filled 2018-07-06: qty 1

## 2018-07-06 MED ORDER — DEXAMETHASONE SODIUM PHOSPHATE 10 MG/ML IJ SOLN
INTRAMUSCULAR | Status: AC
  Filled 2018-07-06: qty 1

## 2018-07-06 MED ORDER — SIMETHICONE 80 MG PO CHEW
80.0000 mg | CHEWABLE_TABLET | Freq: Three times a day (TID) | ORAL | Status: DC
  Administered 2018-07-06 – 2018-07-09 (×7): 80 mg via ORAL
  Filled 2018-07-06 (×9): qty 1

## 2018-07-06 MED ORDER — PRENATAL MULTIVITAMIN CH
1.0000 | ORAL_TABLET | Freq: Every day | ORAL | Status: DC
  Administered 2018-07-06 – 2018-07-09 (×3): 1 via ORAL
  Filled 2018-07-06 (×3): qty 1

## 2018-07-06 MED ORDER — ACETAMINOPHEN 160 MG/5ML PO SOLN: 325.0000 mg | ORAL | Status: DC | PRN

## 2018-07-06 MED ORDER — OXYCODONE-ACETAMINOPHEN 5-325 MG PO TABS
2.0000 | ORAL_TABLET | ORAL | Status: DC | PRN
  Administered 2018-07-08: 2 via ORAL
  Filled 2018-07-06: qty 2

## 2018-07-06 MED ORDER — OXYTOCIN 10 UNIT/ML IJ SOLN
INTRAMUSCULAR | Status: AC
  Filled 2018-07-06: qty 4

## 2018-07-06 MED ORDER — FENTANYL CITRATE (PF) 100 MCG/2ML IJ SOLN
INTRAMUSCULAR | Status: AC
  Administered 2018-07-06: 100 ug via INTRAVENOUS
  Filled 2018-07-06: qty 2

## 2018-07-06 MED ORDER — ENOXAPARIN SODIUM 40 MG/0.4ML ~~LOC~~ SOLN
40.0000 mg | SUBCUTANEOUS | Status: DC
  Administered 2018-07-07 – 2018-07-09 (×3): 40 mg via SUBCUTANEOUS
  Filled 2018-07-06 (×3): qty 0.4

## 2018-07-06 MED ORDER — DIPHENHYDRAMINE HCL 25 MG PO CAPS
25.0000 mg | ORAL_CAPSULE | ORAL | Status: DC | PRN
  Filled 2018-07-06: qty 1

## 2018-07-06 MED ORDER — INSULIN ASPART 100 UNIT/ML ~~LOC~~ SOLN
0.0000 [IU] | Freq: Three times a day (TID) | SUBCUTANEOUS | Status: DC
  Administered 2018-07-07 – 2018-07-08 (×2): 2 [IU] via SUBCUTANEOUS
  Filled 2018-07-06 (×3): qty 1

## 2018-07-06 MED ORDER — FENTANYL CITRATE (PF) 100 MCG/2ML IJ SOLN
100.0000 ug | INTRAMUSCULAR | Status: DC | PRN
  Administered 2018-07-06 (×2): 100 ug via INTRAVENOUS
  Filled 2018-07-06: qty 2

## 2018-07-06 MED ORDER — IBUPROFEN 800 MG PO TABS
800.0000 mg | ORAL_TABLET | Freq: Three times a day (TID) | ORAL | Status: DC
  Administered 2018-07-06 – 2018-07-09 (×10): 800 mg via ORAL
  Filled 2018-07-06 (×11): qty 1

## 2018-07-06 MED ORDER — SENNOSIDES-DOCUSATE SODIUM 8.6-50 MG PO TABS
2.0000 | ORAL_TABLET | ORAL | Status: DC
  Administered 2018-07-06 – 2018-07-09 (×3): 2 via ORAL
  Filled 2018-07-06 (×3): qty 2

## 2018-07-06 MED ORDER — LACTATED RINGERS IV SOLN
INTRAVENOUS | Status: DC | PRN
  Administered 2018-07-06: 06:00:00 via INTRAVENOUS

## 2018-07-06 MED ORDER — MEPERIDINE HCL 25 MG/ML IJ SOLN: 6.2500 mg | INTRAMUSCULAR | Status: DC | PRN

## 2018-07-06 MED ORDER — INSULIN GLARGINE 100 UNIT/ML ~~LOC~~ SOLN: 20.0000 [IU] | Freq: Two times a day (BID) | SUBCUTANEOUS | Status: DC

## 2018-07-06 MED ORDER — WITCH HAZEL-GLYCERIN EX PADS: 1.0000 "application " | MEDICATED_PAD | CUTANEOUS | Status: DC | PRN

## 2018-07-06 MED ORDER — INSULIN ASPART 100 UNIT/ML ~~LOC~~ SOLN
8.0000 [IU] | Freq: Three times a day (TID) | SUBCUTANEOUS | Status: DC
  Administered 2018-07-06 (×2): 8 [IU] via SUBCUTANEOUS
  Filled 2018-07-06 (×2): qty 1

## 2018-07-06 MED ORDER — DIBUCAINE 1 % RE OINT: 1.0000 "application " | TOPICAL_OINTMENT | RECTAL | Status: DC | PRN

## 2018-07-06 MED ORDER — PHENYLEPHRINE 8 MG IN D5W 100 ML (0.08MG/ML) PREMIX OPTIME
INJECTION | INTRAVENOUS | Status: DC | PRN
  Administered 2018-07-06: 60 ug/min via INTRAVENOUS

## 2018-07-06 MED ORDER — SODIUM CHLORIDE 0.9 % IV SOLN
500.0000 mg | Freq: Once | INTRAVENOUS | Status: DC
  Filled 2018-07-06: qty 500

## 2018-07-06 MED ORDER — DIPHENHYDRAMINE HCL 50 MG/ML IJ SOLN: 12.5000 mg | INTRAMUSCULAR | Status: DC | PRN

## 2018-07-06 MED ORDER — SOD CITRATE-CITRIC ACID 500-334 MG/5ML PO SOLN: 30.0000 mL | Freq: Once | ORAL | Status: DC

## 2018-07-06 MED ORDER — PRENATAL MULTIVITAMIN CH: 1.0000 | ORAL_TABLET | Freq: Every day | ORAL | Status: DC

## 2018-07-06 MED ORDER — SIMETHICONE 80 MG PO CHEW
80.0000 mg | CHEWABLE_TABLET | ORAL | Status: DC
  Administered 2018-07-06: 80 mg via ORAL
  Filled 2018-07-06: qty 1

## 2018-07-06 MED ORDER — SIMETHICONE 80 MG PO CHEW
80.0000 mg | CHEWABLE_TABLET | ORAL | Status: DC | PRN
  Administered 2018-07-07: 80 mg via ORAL
  Filled 2018-07-06: qty 1

## 2018-07-06 MED ORDER — OXYTOCIN 40 UNITS IN LACTATED RINGERS INFUSION - SIMPLE MED: 2.5000 [IU]/h | INTRAVENOUS | Status: AC

## 2018-07-06 MED ORDER — POLYETHYLENE GLYCOL 3350 17 G PO PACK
17.0000 g | PACK | Freq: Every day | ORAL | Status: DC
  Administered 2018-07-06 – 2018-07-07 (×2): 17 g via ORAL
  Filled 2018-07-06 (×5): qty 1

## 2018-07-06 MED ORDER — NALBUPHINE HCL 10 MG/ML IJ SOLN: 5.0000 mg | INTRAMUSCULAR | Status: DC | PRN

## 2018-07-06 MED ORDER — ONDANSETRON HCL 4 MG/2ML IJ SOLN: 4.0000 mg | Freq: Three times a day (TID) | INTRAMUSCULAR | Status: DC | PRN

## 2018-07-06 MED ORDER — OXYCODONE-ACETAMINOPHEN 5-325 MG PO TABS
1.0000 | ORAL_TABLET | ORAL | Status: DC | PRN
  Administered 2018-07-06 – 2018-07-09 (×5): 1 via ORAL
  Filled 2018-07-06 (×5): qty 1

## 2018-07-06 MED ORDER — KETOROLAC TROMETHAMINE 30 MG/ML IJ SOLN: 30.0000 mg | Freq: Four times a day (QID) | INTRAMUSCULAR | Status: AC | PRN

## 2018-07-06 MED ORDER — OXYCODONE HCL 5 MG/5ML PO SOLN: 5.0000 mg | Freq: Once | ORAL | Status: DC | PRN

## 2018-07-06 MED ORDER — OXYTOCIN 10 UNIT/ML IJ SOLN
INTRAVENOUS | Status: DC | PRN
  Administered 2018-07-06: 40 [IU] via INTRAVENOUS

## 2018-07-06 MED ORDER — MORPHINE SULFATE (PF) 0.5 MG/ML IJ SOLN
INTRAMUSCULAR | Status: AC
  Filled 2018-07-06: qty 10

## 2018-07-06 MED ORDER — INSULIN REGULAR HUMAN 100 UNIT/ML IJ SOLN: 10.0000 [IU] | Freq: Three times a day (TID) | INTRAMUSCULAR | Status: DC

## 2018-07-06 MED ORDER — SOD CITRATE-CITRIC ACID 500-334 MG/5ML PO SOLN
30.0000 mL | ORAL | Status: DC | PRN
  Administered 2018-07-06: 30 mL via ORAL

## 2018-07-06 MED ORDER — CEFAZOLIN SODIUM-DEXTROSE 2-4 GM/100ML-% IV SOLN
2.0000 g | Freq: Once | INTRAVENOUS | Status: AC
  Administered 2018-07-06: 2 g via INTRAVENOUS

## 2018-07-06 MED ORDER — BUPIVACAINE IN DEXTROSE 0.75-8.25 % IT SOLN
INTRATHECAL | Status: DC | PRN
  Administered 2018-07-06: 1.7 mL via INTRATHECAL

## 2018-07-06 MED ORDER — SODIUM CHLORIDE 0.9% FLUSH: 3.0000 mL | INTRAVENOUS | Status: DC | PRN

## 2018-07-06 MED ORDER — HYDROMORPHONE HCL 1 MG/ML IJ SOLN: 1.0000 mg | INTRAMUSCULAR | Status: DC | PRN

## 2018-07-06 MED ORDER — ONDANSETRON HCL 4 MG/2ML IJ SOLN
INTRAMUSCULAR | Status: AC
  Filled 2018-07-06: qty 2

## 2018-07-06 MED ORDER — FENTANYL CITRATE (PF) 100 MCG/2ML IJ SOLN
INTRAMUSCULAR | Status: DC | PRN
  Administered 2018-07-06: 15 ug via INTRATHECAL

## 2018-07-06 MED ORDER — NALOXONE HCL 0.4 MG/ML IJ SOLN: 0.4000 mg | INTRAMUSCULAR | Status: DC | PRN

## 2018-07-06 MED ORDER — SODIUM CHLORIDE 0.9 % IR SOLN
Status: DC | PRN
  Administered 2018-07-06: 200 mL

## 2018-07-06 MED ORDER — COCONUT OIL OIL
1.0000 "application " | TOPICAL_OIL | Status: DC | PRN
  Filled 2018-07-06: qty 120

## 2018-07-06 MED ORDER — FENTANYL CITRATE (PF) 100 MCG/2ML IJ SOLN: 25.0000 ug | INTRAMUSCULAR | Status: DC | PRN

## 2018-07-06 MED ORDER — LACTATED RINGERS IV SOLN
INTRAVENOUS | Status: DC
  Administered 2018-07-06: 18:00:00 via INTRAVENOUS

## 2018-07-06 MED ORDER — DEXTROSE 5 % IV SOLN
1000.0000 mg | Freq: Once | INTRAVENOUS | Status: AC
  Administered 2018-07-06: 500 mg via INTRAVENOUS

## 2018-07-06 MED ORDER — INSULIN LISPRO 100 UNIT/ML ~~LOC~~ SOLN: 8.0000 [IU] | Freq: Three times a day (TID) | SUBCUTANEOUS | Status: DC

## 2018-07-06 MED ORDER — INSULIN ASPART 100 UNIT/ML ~~LOC~~ SOLN
0.0000 [IU] | SUBCUTANEOUS | Status: DC
  Administered 2018-07-06: 4 [IU] via SUBCUTANEOUS
  Administered 2018-07-06: 2 [IU] via SUBCUTANEOUS

## 2018-07-06 MED ORDER — ASPIRIN 81 MG PO CHEW
81.0000 mg | CHEWABLE_TABLET | Freq: Every day | ORAL | Status: DC
  Filled 2018-07-06 (×3): qty 1

## 2018-07-06 MED ORDER — INSULIN GLARGINE 100 UNIT/ML ~~LOC~~ SOLN
20.0000 [IU] | Freq: Two times a day (BID) | SUBCUTANEOUS | Status: DC
  Administered 2018-07-06 – 2018-07-09 (×6): 20 [IU] via SUBCUTANEOUS
  Filled 2018-07-06 (×7): qty 0.2

## 2018-07-06 MED ORDER — ACETAMINOPHEN 325 MG PO TABS
650.0000 mg | ORAL_TABLET | ORAL | Status: DC | PRN
  Administered 2018-07-06: 650 mg via ORAL
  Filled 2018-07-06: qty 2

## 2018-07-06 MED ORDER — OXYCODONE HCL 5 MG PO TABS: 5.0000 mg | ORAL_TABLET | Freq: Once | ORAL | Status: DC | PRN

## 2018-07-06 MED ORDER — SCOPOLAMINE 1 MG/3DAYS TD PT72
MEDICATED_PATCH | TRANSDERMAL | Status: DC | PRN
  Administered 2018-07-06: 1 via TRANSDERMAL

## 2018-07-06 MED ORDER — FENTANYL CITRATE (PF) 100 MCG/2ML IJ SOLN
INTRAMUSCULAR | Status: AC
  Filled 2018-07-06: qty 2

## 2018-07-06 MED ORDER — PHENYLEPHRINE 8 MG IN D5W 100 ML (0.08MG/ML) PREMIX OPTIME
INJECTION | INTRAVENOUS | Status: AC
  Filled 2018-07-06: qty 100

## 2018-07-06 MED ORDER — KETOROLAC TROMETHAMINE 30 MG/ML IJ SOLN
INTRAMUSCULAR | Status: AC
  Filled 2018-07-06: qty 1

## 2018-07-06 MED ORDER — ZOLPIDEM TARTRATE 5 MG PO TABS: 5.0000 mg | ORAL_TABLET | Freq: Every evening | ORAL | Status: DC | PRN

## 2018-07-06 MED ORDER — TETANUS-DIPHTH-ACELL PERTUSSIS 5-2.5-18.5 LF-MCG/0.5 IM SUSP: 0.5000 mL | Freq: Once | INTRAMUSCULAR | Status: DC

## 2018-07-06 MED ORDER — DEXAMETHASONE SODIUM PHOSPHATE 10 MG/ML IJ SOLN
INTRAMUSCULAR | Status: DC | PRN
  Administered 2018-07-06: 10 mg via INTRAVENOUS

## 2018-07-06 MED ORDER — NALBUPHINE HCL 10 MG/ML IJ SOLN: 5.0000 mg | Freq: Once | INTRAMUSCULAR | Status: DC | PRN

## 2018-07-06 MED ORDER — SCOPOLAMINE 1 MG/3DAYS TD PT72: 1.0000 | MEDICATED_PATCH | Freq: Once | TRANSDERMAL | Status: DC

## 2018-07-06 MED ORDER — NALOXONE HCL 4 MG/10ML IJ SOLN
1.0000 ug/kg/h | INTRAVENOUS | Status: DC | PRN
  Filled 2018-07-06: qty 5

## 2018-07-06 MED ORDER — ONDANSETRON HCL 4 MG/2ML IJ SOLN
INTRAMUSCULAR | Status: DC | PRN
  Administered 2018-07-06: 4 mg via INTRAVENOUS

## 2018-07-06 MED ORDER — KETOROLAC TROMETHAMINE 30 MG/ML IJ SOLN: 30.0000 mg | Freq: Four times a day (QID) | INTRAMUSCULAR | Status: AC

## 2018-07-06 MED ORDER — ONDANSETRON HCL 4 MG/2ML IJ SOLN: 4.0000 mg | Freq: Once | INTRAMUSCULAR | Status: DC | PRN

## 2018-07-06 MED ORDER — INSULIN GLARGINE 100 UNIT/ML ~~LOC~~ SOLN: 30.0000 [IU] | Freq: Two times a day (BID) | SUBCUTANEOUS | Status: DC

## 2018-07-06 MED ORDER — INSULIN ASPART 100 UNIT/ML ~~LOC~~ SOLN
12.0000 [IU] | Freq: Three times a day (TID) | SUBCUTANEOUS | Status: DC
  Filled 2018-07-06: qty 1

## 2018-07-06 MED ORDER — MENTHOL 3 MG MT LOZG: 1.0000 | LOZENGE | OROMUCOSAL | Status: DC | PRN

## 2018-07-06 MED ORDER — KETOROLAC TROMETHAMINE 30 MG/ML IJ SOLN
INTRAMUSCULAR | Status: DC | PRN
  Administered 2018-07-06: 30 mg via INTRAVENOUS

## 2018-07-06 MED ORDER — DIPHENHYDRAMINE HCL 25 MG PO CAPS: 25.0000 mg | ORAL_CAPSULE | Freq: Four times a day (QID) | ORAL | Status: DC | PRN

## 2018-07-06 MED ORDER — ACETAMINOPHEN 325 MG PO TABS: 325.0000 mg | ORAL_TABLET | ORAL | Status: DC | PRN

## 2018-07-06 MED ORDER — INSULIN ASPART 100 UNIT/ML ~~LOC~~ SOLN
12.0000 [IU] | Freq: Three times a day (TID) | SUBCUTANEOUS | Status: DC
  Administered 2018-07-06: 12 [IU] via SUBCUTANEOUS

## 2018-07-06 MED ORDER — MORPHINE SULFATE (PF) 0.5 MG/ML IJ SOLN
INTRAMUSCULAR | Status: DC | PRN
  Administered 2018-07-06: .15 mg via INTRATHECAL

## 2018-07-06 MED ORDER — SODIUM CHLORIDE 0.9 % IR SOLN
Status: DC | PRN
  Administered 2018-07-06: 1000 mL

## 2018-07-06 SURGICAL SUPPLY — 44 items
BENZOIN TINCTURE PRP APPL 2/3 (GAUZE/BANDAGES/DRESSINGS) ×3 IMPLANT
CHLORAPREP W/TINT 26ML (MISCELLANEOUS) ×3 IMPLANT
CLAMP CORD UMBIL (MISCELLANEOUS) IMPLANT
CLOSURE STERI-STRIP 1/2X4 (GAUZE/BANDAGES/DRESSINGS) ×1
CLOSURE WOUND 1/2 X4 (GAUZE/BANDAGES/DRESSINGS) ×1
CLOTH BEACON ORANGE TIMEOUT ST (SAFETY) ×3 IMPLANT
CLSR STERI-STRIP ANTIMIC 1/2X4 (GAUZE/BANDAGES/DRESSINGS) ×2 IMPLANT
DRAPE C SECTION CLR SCREEN (DRAPES) IMPLANT
DRSG OPSITE POSTOP 4X10 (GAUZE/BANDAGES/DRESSINGS) ×3 IMPLANT
ELECT REM PT RETURN 9FT ADLT (ELECTROSURGICAL) ×3
ELECTRODE REM PT RTRN 9FT ADLT (ELECTROSURGICAL) ×1 IMPLANT
EXTRACTOR VACUUM M CUP 4 TUBE (SUCTIONS) IMPLANT
EXTRACTOR VACUUM M CUP 4' TUBE (SUCTIONS)
GLOVE BIO SURGEON STRL SZ7.5 (GLOVE) ×3 IMPLANT
GLOVE BIOGEL PI IND STRL 7.0 (GLOVE) ×1 IMPLANT
GLOVE BIOGEL PI INDICATOR 7.0 (GLOVE) ×2
GOWN STRL REUS W/TWL 2XL LVL3 (GOWN DISPOSABLE) ×3 IMPLANT
GOWN STRL REUS W/TWL LRG LVL3 (GOWN DISPOSABLE) ×6 IMPLANT
KIT ABG SYR 3ML LUER SLIP (SYRINGE) IMPLANT
NEEDLE HYPO 22GX1.5 SAFETY (NEEDLE) ×3 IMPLANT
NEEDLE HYPO 25X5/8 SAFETYGLIDE (NEEDLE) IMPLANT
NS IRRIG 1000ML POUR BTL (IV SOLUTION) ×3 IMPLANT
PACK C SECTION WH (CUSTOM PROCEDURE TRAY) ×3 IMPLANT
PAD ABD 8X10 STRL (GAUZE/BANDAGES/DRESSINGS) ×3 IMPLANT
PAD OB MATERNITY 4.3X12.25 (PERSONAL CARE ITEMS) ×3 IMPLANT
PENCIL SMOKE EVAC W/HOLSTER (ELECTROSURGICAL) ×3 IMPLANT
RTRCTR C-SECT PINK 25CM LRG (MISCELLANEOUS) ×3 IMPLANT
SPONGE GAUZE 4X4 12PLY STER LF (GAUZE/BANDAGES/DRESSINGS) ×6 IMPLANT
SPONGE LAP 18X18 RF (DISPOSABLE) ×9 IMPLANT
STRIP CLOSURE SKIN 1/2X4 (GAUZE/BANDAGES/DRESSINGS) ×2 IMPLANT
SUT CHROMIC 1 CTX 36 (SUTURE) ×12 IMPLANT
SUT VIC AB 1 CT1 36 (SUTURE) ×6 IMPLANT
SUT VIC AB 2-0 CT1 (SUTURE) ×3 IMPLANT
SUT VIC AB 2-0 CT1 27 (SUTURE) ×2
SUT VIC AB 2-0 CT1 TAPERPNT 27 (SUTURE) ×1 IMPLANT
SUT VIC AB 3-0 CT1 27 (SUTURE) ×4
SUT VIC AB 3-0 CT1 TAPERPNT 27 (SUTURE) ×2 IMPLANT
SUT VIC AB 3-0 SH 27 (SUTURE)
SUT VIC AB 3-0 SH 27X BRD (SUTURE) IMPLANT
SUT VIC AB 4-0 KS 27 (SUTURE) ×3 IMPLANT
SYR BULB IRRIGATION 50ML (SYRINGE) IMPLANT
TAPE CLOTH SURG 4X10 WHT LF (GAUZE/BANDAGES/DRESSINGS) ×3 IMPLANT
TOWEL OR 17X24 6PK STRL BLUE (TOWEL DISPOSABLE) ×3 IMPLANT
TRAY FOLEY W/BAG SLVR 14FR LF (SET/KITS/TRAYS/PACK) ×3 IMPLANT

## 2018-07-06 NOTE — Anesthesia Postprocedure Evaluation (Signed)
Anesthesia Post Note  Patient: Megan Silva  Procedure(s) Performed: CESAREAN SECTION (N/A )     Patient location during evaluation: PACU Anesthesia Type: Spinal Level of consciousness: awake and alert Pain management: pain level controlled Vital Signs Assessment: post-procedure vital signs reviewed and stable Respiratory status: spontaneous breathing and respiratory function stable Cardiovascular status: blood pressure returned to baseline and stable Postop Assessment: spinal receding Anesthetic complications: no    Last Vitals:  Vitals:   07/06/18 0715 07/06/18 0730  BP: 103/82 100/69  Pulse: 83 88  Resp: 17 16  Temp:    SpO2: 100% 99%    Last Pain:  Vitals:   07/06/18 0730  TempSrc:   PainSc: 0-No pain   Pain Goal:                 Kennieth Rad

## 2018-07-06 NOTE — Transfer of Care (Signed)
Immediate Anesthesia Transfer of Care Note  Patient: Megan Silva  Procedure(s) Performed: CESAREAN SECTION (N/A )  Patient Location: PACU  Anesthesia Type:Spinal  Level of Consciousness: awake, alert  and oriented  Airway & Oxygen Therapy: Patient Spontanous Breathing  Post-op Assessment: Report given to RN and Post -op Vital signs reviewed and stable  Post vital signs: Reviewed and stable  Last Vitals:  Vitals Value Taken Time  BP 109/62 07/06/2018  6:37 AM  Temp 36.7 C 07/06/2018  6:37 AM  Pulse 97 07/06/2018  6:39 AM  Resp 18 07/06/2018  6:39 AM  SpO2 100 % 07/06/2018  6:39 AM  Vitals shown include unvalidated device data.  Last Pain:  Vitals:   07/06/18 0637  TempSrc: Oral  PainSc:          Complications: No apparent anesthesia complications

## 2018-07-06 NOTE — Plan of Care (Signed)
Pt teaching performed, pt verbalized understanding.  Laurna Shetley, RN 

## 2018-07-06 NOTE — Progress Notes (Signed)
Verbal order from Dr Annia Friendly that pitocin may be decreased to 4mu

## 2018-07-06 NOTE — Op Note (Signed)
Megan Silva PROCEDURE DATE: 07/06/2018  PREOPERATIVE DIAGNOSES: Intrauterine pregnancy at [redacted]w[redacted]d weeks gestation; failure to progress: arrest of descent and fetal intolerance of labor  POSTOPERATIVE DIAGNOSES: The same  PROCEDURE: Primary Low Transverse Cesarean Section  SURGEON:  Dr. Casimiro Needle L. Burhanuddin Kohlmann  ASSISTANT:  NA  ANESTHESIOLOGY TEAM: Anesthesiologist: Bethena Midget, MD CRNA: Junious Silk, CRNA  INDICATIONS: Megan Silva is a 22 y.o. G1P1001 at [redacted]w[redacted]d here for cesarean section secondary to the indications listed under preoperative diagnoses; please see preoperative note for further details.  The risks of cesarean section were discussed with the patient including but were not limited to: bleeding which may require transfusion or reoperation; infection which may require antibiotics; injury to bowel, bladder, ureters or other surrounding organs; injury to the fetus; need for additional procedures including hysterectomy in the event of a life-threatening hemorrhage; placental abnormalities wth subsequent pregnancies, incisional problems, thromboembolic phenomenon and other postoperative/anesthesia complications.   The patient concurred with the proposed plan, giving informed written consent for the procedure.    FINDINGS:  Viable female infant in cephalic presentation.  Apgars as recorded  Clear amniotic fluid.  Intact placenta, three vessel cord.  Normal uterus, fallopian tubes and ovaries bilaterally.  ANESTHESIA: Spinal INTRAVENOUS FLUIDS:As recorded ESTIMATED BLOOD LOSS: 875 ml URINE OUTPUT:  As recorded SPECIMENS: Placenta sent topathology COMPLICATIONS: None immediate  PROCEDURE IN DETAIL:  The patient preoperatively received intravenous antibiotics and had sequential compression devices applied to her lower extremities.  She was then taken to the operating room where spinal anesthesia was administered and was found to be adequate. She was then placed in a dorsal supine  position with a leftward tilt, and prepped and draped in a sterile manner.  A foley catheter was placed into her bladder and attached to constant gravity.  After an adequate timeout was performed, a Pfannenstiel skin incision was made with scalpel and carried through to the underlying layer of fascia. The fascia was incised in the midline, and this incision was extended bilaterally using the Mayo scissors.  Kocher clamps were applied to the superior aspect of the fascial incision and the underlying rectus muscles were dissected off bluntly and sharply.  A similar process was carried out on the inferior aspect of the fascial incision. The rectus muscles were separated in the midline and the peritoneum was entered bluntly. The Alexis self-retaining retractor was introduced into the abdominal cavity.  Attention was turned to the lower uterine segment where a low transverse hysterotomy was made with a scalpel and extended bilaterally bluntly.  The infant was successfully delivered, the cord was clamped and cut after one minute, and the infant was handed over to the awaiting neonatology team. Uterine massage was then administered, and the placenta delivered intact with a three-vessel cord. The uterus was then cleared of clots and debris.  The hysterotomy was closed with 0 Chromic in a running locked fashion, and an imbricating layer was also placed with 0 Chromic.  The pelvis was cleared of all clot and debris. Hemostasis was confirmed on all surfaces.  The retractor was removed.  The peritoneum abd muscles was closed with a 2/0 Vicryl. The fascia was then closed using 0 Vicryl in a running fashion from conner to conner.  The subcutaneous layer was irrigated, reapproximated with 3/0 Vicryl interrupted stitches, and the skin was closed with a 4-0 Vicryl subcuticular stitch. The patient tolerated the procedure well. Sponge, instrument and needle counts were correct x 3.  She was taken to the recovery  room in stable  condition.    Hermina Staggers, MD, FACOG Obstetrician & Gynecologist, [facility], [facility]

## 2018-07-06 NOTE — Progress Notes (Signed)
Patient ID: Megan Silva, female   DOB: 03-Sep-1996, 22 y.o.   MRN: [id number] Pt admitted with SROM about 10 hrs ago. Has progressed to C/C/+1 Has been pushing with little progress and now with variable decelerations. Pt with known Type 1. EFW 2 days ago 4300 gm  Discussed delivery options of VAVD vs c section. Risks of each reviewed. Pt desires to attempt VAVD.  Bladder was emptied of @ 200 cc. Kiwi vacuum applied. With 2 sets of maternal uterine contractions, no forward progress or pop off's  Discussed with pt and FOB. C section recommended to pt. R/B/Post op care reviewed SCD's and antibiotics ordered. Nursing and Anesthesia notified.  Proceed to OR when ready.

## 2018-07-06 NOTE — Progress Notes (Signed)
Dr Alysia Penna notified of CBG of 140.  No coverage at this time

## 2018-07-06 NOTE — Anesthesia Preprocedure Evaluation (Addendum)
Anesthesia Evaluation  Patient identified by MRN, date of birth, ID band Patient awake    Reviewed: Allergy & Precautions, H&P , NPO status , Patient's Chart, lab work & pertinent test results, reviewed documented beta blocker date and time   Airway Mallampati: II  TM Distance: >3 FB Neck ROM: full    Dental no notable dental hx.    Pulmonary neg pulmonary ROS,    Pulmonary exam normal breath sounds clear to auscultation       Cardiovascular negative cardio ROS Normal cardiovascular exam Rhythm:regular Rate:Normal     Neuro/Psych negative neurological ROS  negative psych ROS   GI/Hepatic negative GI ROS, Neg liver ROS,   Endo/Other  diabetes, Type obesity  Renal/GU negative Renal ROS  negative genitourinary   Musculoskeletal   Abdominal   Peds  Hematology negative hematology ROS (+)   Anesthesia Other Findings   Reproductive/Obstetrics (+) Pregnancy                            Anesthesia Physical Anesthesia Plan  ASA: III and emergent  Anesthesia Plan: Spinal   Post-op Pain Management:    Induction:   PONV Risk Score and Plan: 2 and Ondansetron, Treatment may vary due to age or medical condition and Scopolamine patch - Pre-op  Airway Management Planned: Nasal Cannula, Natural Airway and Mask  Additional Equipment:   Intra-op Plan:   Post-operative Plan:   Informed Consent: I have reviewed the patients History and Physical, chart, labs and discussed the procedure including the risks, benefits and alternatives for the proposed anesthesia with the patient or authorized representative who has indicated his/her understanding and acceptance.     Plan Discussed with: CRNA, Anesthesiologist and Surgeon  Anesthesia Plan Comments:        Anesthesia Quick Evaluation

## 2018-07-06 NOTE — Progress Notes (Addendum)
At 0308, called to patient room 2/2 prolonged decel. Upon my arrival infant HR already recovered in 140s with good variability and accels, pitocin off, mom on hands and knees. Examined by resident Dr. Annia Friendly, 9.5 cm dilated. Will continue with pitocin off, will ctm, expect vaginal delivery.   At 0328, returned to room during prolonged decel which resolved without intervention. Cervix still 9.5 cm and 0 station, but fetal heart rate overall reassuring with good variability and accels. Patient not feeling urge to push and FHR stable during contractions. At around 0350, patient decided she wanted to push; poor effort with little fetal movement, again recurrent late and variable decels with pushing. Given poor effort, elected to wait until mom feeling more pressure, however patient continued to have late and variable decels with contractions that were not responsive to resuscitative maneuvers, elected to start pushing again. With continued decels and minimal fetal movement with pushing, called Dr. Alysia Penna to the bedside for assessment for operative vaginal delivery vs. Cesarean.

## 2018-07-06 NOTE — Progress Notes (Signed)
Patient is type I diabetic who usually takes insulin 5 times daily.  She reports that during pregnancy, she was taking Lantus 38 units BID and lispro 14 units TID. However, prior to pregnancy, she took slightly less insulin (~lantus 34 units BID, lispro 12 units TID).  Given pt will be on diabetic diet and likely to have lower PO intake inpatient/postop compared to home, will decrease regimen while inpatient and titrate up PRN. Discussed this plan with patient who understands and agrees.   Megan Abbot, MD  07/06/18 10:10 AM

## 2018-07-06 NOTE — Anesthesia Procedure Notes (Signed)
Spinal  Patient location during procedure: OR Start time: 07/06/2018 5:11 AM End time: 07/06/2018 5:13 AM Staffing Anesthesiologist: Bethena Midget, MD Preanesthetic Checklist Completed: patient identified, site marked, surgical consent, pre-op evaluation, timeout performed, IV checked, risks and benefits discussed and monitors and equipment checked Spinal Block Patient position: sitting Prep: DuraPrep Patient monitoring: heart rate, cardiac monitor, continuous pulse ox and blood pressure Approach: midline Location: L4-5 Injection technique: single-shot Needle Needle type: Sprotte  Needle gauge: 24 G Needle length: 9 cm Assessment Sensory level: T4

## 2018-07-06 NOTE — Progress Notes (Signed)
CBG 140  

## 2018-07-06 NOTE — Progress Notes (Signed)
LABOR PROGRESS NOTE  Megan Silva is a 22 y.o. G1P0000 at [redacted]w[redacted]d  admitted for SROM.   Subjective: Painful contractions, more frequent. Requesting pain medication.   Objective: BP 138/80   Pulse 89   Temp 98.6 F (37 C) (Axillary)   Resp 19   Ht (P) 5\' 7"  (1.702 m)   Wt 89.8 kg   LMP 10/20/2017 (Exact Date)   BMI (P) 31.01 kg/m  or  Vitals:   07/05/18 2350 07/06/18 0001 07/06/18 0101 07/06/18 0131  BP: 123/74 135/83 132/81 138/80  Pulse: 97 89 99 89  Resp: 18 18  19   Temp:    98.6 F (37 C)  TempSrc:    Axillary  Weight:      Height:        Dilation: 7 Effacement (%): 80 Cervical Position: Middle Station: -1 Presentation: Vertex Exam by:: Dr Annia Friendly and Genevive Bi FHT: baseline rate 140-150, moderate varibility, +acel, variable decel Toco: every 2-4 min   Labs: Lab Results  Component Value Date   WBC 9.7 07/05/2018   HGB 9.8 (L) 07/05/2018   HCT 31.7 (L) 07/05/2018   MCV 72.4 (L) 07/05/2018   PLT 249 07/05/2018    Patient Active Problem List   Diagnosis Date Noted  . PROM (premature rupture of membranes) 07/05/2018  . GBS (group b Streptococcus) UTI complicating pregnancy, unspecified trimester 06/10/2018  . Supervision of high risk pregnancy, antepartum, third trimester 05/27/2018  . Diabetes mellitus complicating pregnancy 05/27/2018  . GBS bacteriuria 05/27/2018    Assessment / Plan: 22 y.o. G1P0000 at [redacted]w[redacted]d here for SROM.   Labor:  IOL, significant change since last evaluation. Will decrease pit down to 4.  Fetal Wellbeing:  Cat 2 strip, attempting position changes and LR bolus. Will monitor closely.  Pain Control:  IV fent  Anticipated MOD:  NSVD   Leticia Penna, D.O. Family Medicine PGY-1 07/06/2018, 1:41 AM

## 2018-07-07 LAB — CBC
HCT: 24.6 % — ABNORMAL LOW (ref 36.0–46.0)
Hemoglobin: 7.8 g/dL — ABNORMAL LOW (ref 12.0–15.0)
MCH: 22.7 pg — AB (ref 26.0–34.0)
MCHC: 31.7 g/dL (ref 30.0–36.0)
MCV: 71.7 fL — AB (ref 80.0–100.0)
PLATELETS: 244 10*3/uL (ref 150–400)
RBC: 3.43 MIL/uL — ABNORMAL LOW (ref 3.87–5.11)
RDW: 16 % — AB (ref 11.5–15.5)
WBC: 19.8 10*3/uL — ABNORMAL HIGH (ref 4.0–10.5)
nRBC: 0.5 % — ABNORMAL HIGH (ref 0.0–0.2)

## 2018-07-07 LAB — GLUCOSE, CAPILLARY
GLUCOSE-CAPILLARY: 97 mg/dL (ref 70–99)
GLUCOSE-CAPILLARY: 59 mg/dL — AB (ref 70–99)
GLUCOSE-CAPILLARY: 88 mg/dL (ref 70–99)
Glucose-Capillary: 58 mg/dL — ABNORMAL LOW (ref 70–99)
Glucose-Capillary: 78 mg/dL (ref 70–99)

## 2018-07-07 MED ORDER — INSULIN ASPART 100 UNIT/ML ~~LOC~~ SOLN
4.0000 [IU] | Freq: Once | SUBCUTANEOUS | Status: AC
  Administered 2018-07-07: 4 [IU] via SUBCUTANEOUS

## 2018-07-07 MED ORDER — INSULIN ASPART 100 UNIT/ML ~~LOC~~ SOLN
8.0000 [IU] | Freq: Three times a day (TID) | SUBCUTANEOUS | Status: DC
  Administered 2018-07-07: 8 [IU] via SUBCUTANEOUS
  Administered 2018-07-08: 4 [IU] via SUBCUTANEOUS
  Filled 2018-07-07: qty 1

## 2018-07-07 NOTE — Lactation Note (Signed)
This note was copied from a baby's chart. Lactation Consultation Note Baby 65 hrs old. New mom w/DM w/multiple hospitalizations during pregnancy w/uncontroled blood sugars. Baby 37 0/7 wks. Sleepy, not interested in BF at this time. FOB held baby STS while LC hand expressed 10 ml colostrum. Mom demonstrated hand expression. Mom has round tight breast, filling as well as probable edema d/t generalized edema from C/S. Breast massage, reverse pressure to areola and base of nipples prior to latching for increased compressibility for latching. Praised mom for so much colostrum. D/t baby having low blood sugar after delivery and LGA. Asked mom to BF on cues and every 3 hrs if hasn't cued. If baby hasn't fed or will not take supplement alert RN. Reviewed s/sx of hypoglycemia.  Asked mom to give baby colostrum after BF until baby is feeding well and sugars are stable.  ETI behavior, feeding habits, importance of STS, I&O discussed. LPI information sheet given and reviewed. Mom encouraged to feed baby 8-12 times/24 hours and with feeding cues.  D/t mom needing to supplement and breast are filling w/ transitional milk LC took DEBP and kit into rm. Asked tech to set up pump and will ask RN to instruct mom on pumping. Encouraged mom to hand express after pumping to give baby. Positioning, comfort, support and breast massage during feeding discussed. Encouraged mom toc all for assistance or questions. [place] brochure given w/resources, support groups and [place] services.  Patient Name: Boy Cristy Colmenares EFEOF'H Date: 07/07/2018 Reason for consult: Initial assessment;Maternal endocrine disorder;1st time breastfeeding;Early term 37-38.6wks Type of Endocrine Disorder?: Diabetes   Maternal Data Has patient been taught Hand Expression?: Yes Does the patient have breastfeeding experience prior to this delivery?: No  Feeding Feeding Type: Breast Milk  LATCH Score Latch: Too sleepy or reluctant, no latch  achieved, no sucking elicited.  Audible Swallowing: None  Type of Nipple: Everted at rest and after stimulation  Comfort (Breast/Nipple): Filling, red/small blisters or bruises, mild/mod discomfort(filling)  Hold (Positioning): Full assist, staff holds infant at breast  LATCH Score: 3  Interventions Interventions: Breast feeding basics reviewed;Support pillows;Assisted with latch;Position options;Skin to skin;Expressed milk;Breast massage;Hand express;Shells;Pre-pump if needed;Reverse pressure;Hand pump;DEBP;Breast compression;Adjust position  Lactation Tools Discussed/Used Tools: Shells;Pump Shell Type: Inverted Breast pump type: Double-Electric Breast Pump;Manual WIC Program: No Pump Review: Milk Storage(asked to set ) Initiated by:: Allayne Stack RN IBCLC/RN(LC took Pump and kit/discussed pumping) Date initiated:: 07/07/18   Consult Status Consult Status: Follow-up Date: 07/07/18 Follow-up type: In-patient    Theodoro Kalata 07/07/2018, 12:42 AM

## 2018-07-07 NOTE — Progress Notes (Signed)
Dr Erin Fulling notified of cbg 50, treatment, and recheck cbg 78. Ordered Novolog 4 units instead of 8 prior to lunch.

## 2018-07-07 NOTE — Progress Notes (Signed)
Patient awakened for cbg since it had been 4 hours since she had breakfast.  Cbg 59 at 1343.  Given 4 oz. juice and cbg 78 on recheck at 1403.  Pt. to order lunch and instructed to call when it arrives.

## 2018-07-07 NOTE — Progress Notes (Signed)
Steward Drone, CNM, notified of cbg 72;  adjusted Novolog to 4 units prior to dinner.

## 2018-07-07 NOTE — Progress Notes (Signed)
Subjective: POD#1: Cesarean Delivery for NRFHTs, vacuum without progression in descent  Patient reports feeling shaky, tired. Tech in room, BG 58. Nurse to go get juice and crackers. Pain well-controlled, no clots when using bathroom. Got up out of bed without dizziness or lightheadedness.   Objective: Vital signs in last 24 hours: Temp:  [98 F (36.7 C)-98.7 F (37.1 C)] 98.7 F (37.1 C) (10/20 0654) Pulse Rate:  [74-88] 85 (10/20 0654) Resp:  [16-18] 18 (10/20 0654) BP: (117-127)/(60-72) 118/65 (10/20 0654) SpO2:  [95 %-99 %] 98 % (10/20 0654)  Physical Exam:  General: quiet, NAD Lochia: appropriate Uterine Fundus: firm Incision: pressure dressing still in place, C/D/I DVT Evaluation: No significant calf/ankle edema.  Recent Labs    07/06/18 0848 07/07/18 0534  HGB 8.8* 7.8*  HCT 27.6* 24.6*    Assessment/Plan: Status post Cesarean section. Doing well postoperatively.  Continue current care.  Type I DM: Pre pregnancy regimen Lantus 34U BID, 12U TID. Decreased slightly because had been increased in pregnancy and unsure how would do with eating POD#1. BG ranged from 58-200s.  -- continue Lantus 20U BID -- decrease Lispro from 12 to 8U [facility] + SSI ACHS   Tamera Stands, DO 07/07/2018, 10:47 AM

## 2018-07-07 NOTE — Progress Notes (Signed)
Dr Annia Friendly notified of cbg of 60 this am treated with 4 oz juice and cbg 97 when rechecked.  Clarified insulin orders.

## 2018-07-08 LAB — GLUCOSE, CAPILLARY
GLUCOSE-CAPILLARY: 108 mg/dL — AB (ref 70–99)
GLUCOSE-CAPILLARY: 117 mg/dL — AB (ref 70–99)
GLUCOSE-CAPILLARY: 129 mg/dL — AB (ref 70–99)
GLUCOSE-CAPILLARY: 140 mg/dL — AB (ref 70–99)
Glucose-Capillary: 92 mg/dL (ref 70–99)
Glucose-Capillary: 70 mg/dL (ref 70–99)
Glucose-Capillary: 78 mg/dL (ref 70–99)

## 2018-07-08 MED ORDER — INSULIN ASPART 100 UNIT/ML ~~LOC~~ SOLN
4.0000 [IU] | Freq: Three times a day (TID) | SUBCUTANEOUS | Status: DC
  Administered 2018-07-08 – 2018-07-09 (×2): 4 [IU] via SUBCUTANEOUS
  Filled 2018-07-08 (×2): qty 1

## 2018-07-08 MED ORDER — INSULIN ASPART 100 UNIT/ML ~~LOC~~ SOLN: 0.0000 [IU] | Freq: Every day | SUBCUTANEOUS | Status: DC

## 2018-07-08 MED ORDER — INSULIN ASPART 100 UNIT/ML ~~LOC~~ SOLN
0.0000 [IU] | Freq: Three times a day (TID) | SUBCUTANEOUS | Status: DC
  Administered 2018-07-08 – 2018-07-09 (×2): 1 [IU] via SUBCUTANEOUS

## 2018-07-08 MED ORDER — INSULIN ASPART 100 UNIT/ML ~~LOC~~ SOLN: 0.0000 [IU] | Freq: Three times a day (TID) | SUBCUTANEOUS | Status: DC

## 2018-07-08 MED ORDER — INSULIN ASPART 100 UNIT/ML ~~LOC~~ SOLN: 4.0000 [IU] | Freq: Three times a day (TID) | SUBCUTANEOUS | Status: DC

## 2018-07-08 NOTE — Lactation Note (Signed)
This note was copied from a baby's chart. Lactation Consultation Note  Patient Name: Megan Silva [id number] Date: 07/08/2018 Reason for consult: Follow-up assessment;Early term 37-38.6wks;1st time breastfeeding;Primapara Type of Endocrine Disorder?: Diabetes  P1 mother whose infant is now 72 hours old.  This is an ETI at 37+0 weeks.  After reviewing chart I noticed that mother has been primarily formula feeding. I questioned her on her feeding plan and she would like to breast/bottle feed.  Baby was not showing cues and was sleeping at this time.  I encouraged mother to always put baby to breast prior to supplementing.  Supplementing guidelines reviewed.  Mother did not seem very interested in talking or reviewing breast feeding basics For this reason I kept my conversation to a minimum.  I offered latch assistance as needed and encouraged mother to call for help.  Mother is pumping with the DEBP approximately every 3 hours.  Visitors present.   Maternal Data Formula Feeding for Exclusion: No Has patient been taught Hand Expression?: Yes Does the patient have breastfeeding experience prior to this delivery?: No  Feeding    LATCH Score                   Interventions    Lactation Tools Discussed/Used WIC Program: No Initiated by:: Already initiated   Consult Status Consult Status: Follow-up Date: 07/09/18 Follow-up type: In-patient    Dora Sims 07/08/2018, 12:23 PM

## 2018-07-08 NOTE — Progress Notes (Addendum)
Inpatient Diabetes Program Recommendations  AACE/ADA: New Consensus Statement on Inpatient Glycemic Control (2015)  Target Ranges:  Prepandial:   less than 140 mg/dL      Peak postprandial:   less than 180 mg/dL (1-2 hours)      Critically ill patients:  140 - 180 mg/dL   Lab Results  Component Value Date   GLUCAP 70 07/08/2018   HGBA1C 8.8 (H) 05/27/2018    Review of Glycemic Control Results for Megan Silva, Megan Silva (MRN [id number]) as of 07/08/2018 09:40  Ref. Range 07/07/2018 21:02 07/08/2018 00:58 07/08/2018 06:20 07/08/2018 08:44  Glucose-Capillary Latest Ref Range: 70 - 99 mg/dL 88 409 (H) 92 70   Diabetes history: Type 1 DM Outpatient Diabetes medications: Lantus 36 units BID, Humalog 14 units TID Current orders for Inpatient glycemic control: Novolog 0-16 units TID+HS, Lantus 20 units BID, Novolog 8 units TID Decadron 10 mg on 10/19  Inpatient Diabetes Program Recommendations:    Addendum@ 1030: Received consult and noted orders changes. Novolog 8 units TID discontinued. Discussed plan with RN and Novolog 4 units was given per Dr Annia Friendly. Encouraged to check 2 hour pp BS following.   Consider changing order set to Glycemic control now that patient is delivered.  -Novolog 0-9 units TID - Novolog 0-5 units QHS - Novolog 4 units TID (assuming that patient is consuming >50% of meal) - Check CBGs TID + HS to ensure correction given appropriately.   [place], patient will need PCP following DC for outpatient DM management. Thanks, Lujean Rave, MSN, RNC-OB Diabetes Coordinator [phone] (8a-5p)

## 2018-07-08 NOTE — Progress Notes (Signed)
Subjective: Postpartum Day 2: Cesarean Delivery Patient reports incisional pain, tolerating PO and + flatus.    Objective: Vital signs in last 24 hours: Temp:  [98.1 F (36.7 C)-98.7 F (37.1 C)] 98.1 F (36.7 C) (10/21 0655) Pulse Rate:  [83-104] 91 (10/21 0655) Resp:  [18] 18 (10/21 0655) BP: (120-129)/(57-78) 129/78 (10/21 0655) SpO2:  [95 %-100 %] 95 % (10/21 7829)  Physical Exam:  General: alert, cooperative and no distress Lochia: appropriate Uterine Fundus: firm Incision: no significant drainage DVT Evaluation: No evidence of DVT seen on physical exam.  Recent Labs    07/06/18 0848 07/07/18 0534  HGB 8.8* 7.8*  HCT 27.6* 24.6*    Assessment/Plan: Status post Cesarean section. Doing well postoperatively.  Continue current care Will consult Diabetes Nurse today and also case manager for followup primary MD.  Wynelle Bourgeois 07/08/2018, 11:33 AM

## 2018-07-08 NOTE — Progress Notes (Signed)
Steward Drone, CNM, notified of bedtime cbg 117; 2 units Novolog to be given to patient.

## 2018-07-09 ENCOUNTER — Telehealth: Payer: Self-pay | Admitting: Obstetrics and Gynecology

## 2018-07-09 LAB — GLUCOSE, CAPILLARY
GLUCOSE-CAPILLARY: 82 mg/dL (ref 70–99)
Glucose-Capillary: 102 mg/dL — ABNORMAL HIGH (ref 70–99)
Glucose-Capillary: 130 mg/dL — ABNORMAL HIGH (ref 70–99)
Glucose-Capillary: 46 mg/dL — ABNORMAL LOW (ref 70–99)

## 2018-07-09 MED ORDER — FERROUS SULFATE 325 (65 FE) MG PO TABS: 325.0000 mg | ORAL_TABLET | Freq: Two times a day (BID) | ORAL | Status: DC

## 2018-07-09 MED ORDER — SENNOSIDES-DOCUSATE SODIUM 8.6-50 MG PO TABS: 2.0000 | ORAL_TABLET | ORAL | 0 refills | Status: DC

## 2018-07-09 MED ORDER — INSULIN GLARGINE 100 UNIT/ML ~~LOC~~ SOLN
15.0000 [IU] | Freq: Two times a day (BID) | SUBCUTANEOUS | Status: DC
  Administered 2018-07-09: 15 [IU] via SUBCUTANEOUS
  Filled 2018-07-09 (×3): qty 0.15

## 2018-07-09 MED ORDER — IBUPROFEN 800 MG PO TABS: 800.0000 mg | ORAL_TABLET | Freq: Three times a day (TID) | ORAL | 1 refills | Status: DC

## 2018-07-09 MED ORDER — FERROUS SULFATE 325 (65 FE) MG PO TABS: 325.0000 mg | ORAL_TABLET | Freq: Two times a day (BID) | ORAL | 3 refills | Status: DC

## 2018-07-09 MED ORDER — POLYETHYLENE GLYCOL 3350 17 G PO PACK: 17.0000 g | PACK | Freq: Every day | ORAL | 0 refills | Status: DC

## 2018-07-09 MED ORDER — INSULIN GLARGINE 100 UNIT/ML ~~LOC~~ SOLN: 15.0000 [IU] | Freq: Two times a day (BID) | SUBCUTANEOUS | 5 refills | Status: DC

## 2018-07-09 MED ORDER — OXYCODONE-ACETAMINOPHEN 5-325 MG PO TABS: 1.0000 | ORAL_TABLET | ORAL | 0 refills | Status: DC | PRN

## 2018-07-09 MED ORDER — INSULIN LISPRO 100 UNIT/ML ~~LOC~~ SOLN: 4.0000 [IU] | Freq: Three times a day (TID) | SUBCUTANEOUS | 5 refills | Status: DC

## 2018-07-09 NOTE — Care Management Note (Signed)
Case Management Note  Patient Details  Name: Megan Silva MRN: [id number] Date of Birth: [birth year]  Subjective/Objective:     22 year old female admitted 07/05/18 S/P LTCS.              Action/Plan:D/C when medically stable.                  Expected Discharge Plan:  Home/Self Care  Discharge planning Services  CM Consult  Status of Service:  Completed, signed off    Additional Comments:CM received consult.  CM met with pt in pt's hospital room.  Pt given list of possible PCP's for outpatient follow up of her DM.  Pt with  flat affect and disinterested in information provided.  FOB present for information and very interested.  Aida Raider RNC-MNN, BSN 07/09/2018, 8:48 AM

## 2018-07-09 NOTE — Progress Notes (Addendum)
Glucose 82 BP 137/84 HR 84

## 2018-07-09 NOTE — Progress Notes (Signed)
POSTPARTUM PROGRESS NOTE  POD #3  Subjective:  Megan Silva is a 22 y.o. G1P1001 s/p pLTCS at [redacted]w[redacted]d.  She reports she is doing well. No acute events overnight. She denies any problems with ambulating, voiding or po intake. Denies nausea or vomiting. She has passed flatus. Pain is well controlled.  Lochia is progressively decreased from prior days. She previously managed her T1DM with 36U Glargine BID and 14U Lispro AC as an outpatient.  Objective: Blood pressure 121/76, pulse 91, temperature 97.9 F (36.6 C), temperature source Oral, resp. rate 18, height (P) 5\' 7"  (1.702 m), weight 89.8 kg, last menstrual period 10/20/2017, SpO2 100 %, unknown if currently breastfeeding.  Physical Exam:  General: alert, cooperative and no distress Chest: no respiratory distress Heart:regular rate, distal pulses intact Abdomen: soft, nontender,  Uterine Fundus: firm, appropriately tender DVT Evaluation: No calf swelling or tenderness Extremities: mild LE edema Skin: warm, dry; incision clean/dry/intact w/ honeycomb dressing in place  Recent Labs    07/06/18 0848 07/07/18 0534  HGB 8.8* 7.8*  HCT 27.6* 24.6*    Assessment/Plan: Megan Silva is a 22 y.o. G1P1001 s/p pLTCS at [redacted]w[redacted]d for PROM.  POD#3 - Doing welll; pain well controlled. H/H downtrending from 9.8 -> 8.8 -> 7.8 on 10/20 but asymptomatic.   Routine postpartum care  OOB, ambulated  Lovenox for VTE prophylaxis T1DM: Patient recognizes she needs less insulin post partum.  Trial 15U Glargine BID and 4U Aspart AC Anemia: asymptomatic  Start po ferrous sulfate BID Contraception: OCPs Feeding: Breast  Dispo: Plan for discharge tomorrow or later today if T1DM regimen controls BS. Connect with FM appointment for f/u and referral to Endocrine.   LOS: 4 days   Matthew Saras, [facility] MS3  07/09/2018, 9:04 AM

## 2018-07-09 NOTE — Discharge Summary (Signed)
OB Discharge Summary     Patient Name: Megan Silva DOB: 09-10-1996 MRN: [id number]  Date of admission: 07/05/2018 Delivering MD: Hermina Staggers   Date of discharge: 07/09/2018  Admitting diagnosis: LEAKING FLUIDS , CTX Intrauterine pregnancy: [redacted]w[redacted]d     Secondary diagnosis:  Active Problems:   PROM (premature rupture of membranes)   Post-operative state  Additional problems:  Type 1 DM      Discharge diagnosis: Term Pregnancy Delivered and Anemia                                                                                                Post partum procedures:none  Augmentation: Pitocin  Complications: Failed VAVD  Hospital course:  Onset of Labor With Unplanned C/S  22 y.o. yo G1P1001 at [redacted]w[redacted]d was admitted in Latent Labor on 07/05/2018. Patient had a labor course significant for variable decels with no forward progress with pushing and failed VAVD. Membrane Rupture Time/Date: 4:00 PM ,07/05/2018   The patient went for cesarean section due to Arrest of Descent, Non-Reassuring FHR and failed VAVD, and delivered a Viable infant,07/06/2018  Details of operation can be found in separate operative note. Patient had an uncomplicated postpartum course.  She is ambulating,tolerating a regular diet, passing flatus, and urinating well.  Patient is discharged home in stable condition 07/09/18. Insulin regimen was adjusted prior to d/c. Due to hypoglycemia, Lantus was lowered to 15 units BID (from 36 units BID during pregnancy) and Novolog decreased to 4 units TID with meals (from 14u TID). Patient has meter and supplies for checking CBGs and was provided instructions for SSI and hypoglycemia protocol. Patient to establish with PCP and ultimately endocrinology for further management of T1DM.   Physical exam  Vitals:   07/08/18 1417 07/08/18 2318 07/09/18 0600 07/09/18 1500  BP: 126/73 129/70 121/76 135/77  Pulse: 92 (!) 102 91 95  Resp: 17 18 18 18   Temp: 99.3 F (37.4 C) 98.7  F (37.1 C) 97.9 F (36.6 C) 98 F (36.7 C)  TempSrc: Oral Oral Oral Oral  SpO2: 99%  100%   Weight:      Height:       General: alert and cooperative Lochia: appropriate Uterine Fundus: firm Incision: Dressing is clean, dry, and intact DVT Evaluation: No evidence of DVT seen on physical exam. Labs: Lab Results  Component Value Date   WBC 19.8 (H) 07/07/2018   HGB 7.8 (L) 07/07/2018   HCT 24.6 (L) 07/07/2018   MCV 71.7 (L) 07/07/2018   PLT 244 07/07/2018   CMP Latest Ref Rng & Units 07/06/2018  Glucose 65 - 99 mg/dL -  BUN 6 - 20 mg/dL -  Creatinine 4.09 - 8.11 mg/dL 9.14  Sodium 782 - 956 mmol/L -  Potassium 3.5 - 5.2 mmol/L -  Chloride 96 - 106 mmol/L -  CO2 20 - 29 mmol/L -  Calcium 8.7 - 10.2 mg/dL -  Total Protein 6.0 - 8.5 g/dL -  Total Bilirubin 0.0 - 1.2 mg/dL -  Alkaline Phos 39 - 213 IU/L -  AST 0 - 40 IU/L -  ALT 0 - 32 IU/L -    Discharge instruction: per After Visit Summary and "Baby and Me Booklet".  After visit meds:  Allergies as of 07/09/2018   No Known Allergies     Medication List    STOP taking these medications   cephALEXin 500 MG capsule Commonly known as:  KEFLEX     TAKE these medications   ferrous sulfate 325 (65 FE) MG tablet Take 1 tablet (325 mg total) by mouth 2 (two) times daily with a meal.   fluticasone 50 MCG/ACT nasal spray Commonly known as:  FLONASE Place 2 sprays into both nostrils daily.   ibuprofen 800 MG tablet Commonly known as:  ADVIL,MOTRIN Take 1 tablet (800 mg total) by mouth 3 (three) times daily.   insulin glargine 100 UNIT/ML injection Commonly known as:  LANTUS Inject 0.15 mLs (15 Units total) into the skin 2 (two) times daily. What changed:  how much to take   insulin lispro 100 UNIT/ML injection Commonly known as:  HUMALOG Inject 0.04 mLs (4 Units total) into the skin 3 (three) times daily before meals. What changed:  how much to take   INSULIN SYRINGE .3CC/31GX5/16" 31G X 5/16" 0.3 ML  Misc 1 Syringe by Does not apply route 5 (five) times daily.   oxyCODONE-acetaminophen 5-325 MG tablet Commonly known as:  PERCOCET/ROXICET Take 1 tablet by mouth every 4 (four) hours as needed (pain scale 4-7).   PEN NEEDLES 31GX5/16" 31G X 8 MM Misc 1 Dose by Does not apply route 4 (four) times daily.   polyethylene glycol packet Commonly known as:  MIRALAX / GLYCOLAX Take 17 g by mouth daily. Start taking on:  07/10/2018   prenatal multivitamin Tabs tablet Take 1 tablet by mouth daily at 12 noon.   senna-docusate 8.6-50 MG tablet Commonly known as:  Senokot-S Take 2 tablets by mouth daily. Start taking on:  07/10/2018       Diet: carb modified diet  Activity: Advance as tolerated. Pelvic rest for 6 weeks.   Outpatient follow up:2 weeks for incision check, 4 weeks for PP visit  Follow up Appt:No future appointments. Follow up Visit:No follow-ups on file.  Postpartum contraception: Combination OCPs  Newborn Data: Live born female  Birth Weight: 9 lb 11.4 oz (4405 g) APGAR: 8, 9  Newborn Delivery   Birth date/time:  07/06/2018 05:35:00 Delivery type:  C-Section, Low Transverse Trial of labor:  Yes C-section categorization:  Primary     Baby Feeding: Breast Disposition:rooming in   07/09/2018 De Hollingshead, DO

## 2018-07-09 NOTE — Progress Notes (Signed)
I called and Reported pt's AM glucoses and BP's and awaiting return call from diabetes coordinator regardiing insulin doses and administration.  Dr Earlene Plater stated okay to follow diabetic coordinator's recommendations. Lauren McDaniel's diabetic coordinator returns my phone call and will consult with Dr Earlene Plater regarding pt's insulin-plan to change lantus dose and hold AM Novolog.

## 2018-07-09 NOTE — Telephone Encounter (Signed)
Sending MyChart message

## 2018-07-09 NOTE — Discharge Instructions (Signed)
You will need to check your blood sugars 4 times per day (first thing in the morning and before each meal). Take Lantus 15 units twice per day and Novolog 4 units with each meal. Use the following sliding scale for additional Novolog based on your blood sugars.  CBG <200: 0 units  CBG 201-250: 2 units  CBG 251-300: 3 units  Call your doctor for any blood sugars less than 70 or greater than 300.   Contact the family medicine center at [phone] to get established with a primary care doctor. You will need a referral to endocrinology.    Hypoglycemia Hypoglycemia is when the sugar (glucose) level in the blood is too low. Symptoms of low blood sugar may include:  Feeling: ? Hungry. ? Worried or nervous (anxious). ? Sweaty and clammy. ? Confused. ? Dizzy. ? Sleepy. ? Sick to your stomach (nauseous).  Having: ? A fast heartbeat. ? A headache. ? A change in your vision. ? Jerky movements that you cannot control (seizure). ? Nightmares. ? Tingling or no feeling (numbness) around the mouth, lips, or tongue.  Having trouble with: ? Talking. ? Paying attention (concentrating). ? Moving (coordination). ? Sleeping.  Shaking.  Passing out (fainting).  Getting upset easily (irritability).  Low blood sugar can happen to people who have diabetes and people who do not have diabetes. Low blood sugar can happen quickly, and it can be an emergency. Treating Low Blood Sugar Low blood sugar is often treated by eating or drinking something sugary right away. If you can think clearly and swallow safely, follow the 15:15 rule:  Take 15 grams of a fast-acting carb (carbohydrate). Some fast-acting carbs are: ? 1 tube of glucose gel. ? 3 sugar tablets (glucose pills). ? 6-8 pieces of hard candy. ? 4 oz (120 mL) of fruit juice. ? 4 oz (120 mL) of regular (not diet) soda.  Check your blood sugar 15 minutes after you take the carb.  If your blood sugar is still at or below 70 mg/dL (3.9  mmol/L), take 15 grams of a carb again.  If your blood sugar does not go above 70 mg/dL (3.9 mmol/L) after 3 tries, get help right away.  After your blood sugar goes back to normal, eat a meal or a snack within 1 hour.  Treating Very Low Blood Sugar If your blood sugar is at or below 54 mg/dL (3 mmol/L), you have very low blood sugar (severe hypoglycemia). This is an emergency. Do not wait to see if the symptoms will go away. Get medical help right away. Call your local emergency services (911 in the [place] Do not drive yourself to the hospital. If you have very low blood sugar and you cannot eat or drink, you may need a glucagon shot (injection). A family member or friend should learn how to check your blood sugar and how to give you a glucagon shot. Ask your doctor if you need to have a glucagon shot kit at home. Follow these instructions at home: General instructions  Avoid any diets that cause you to not eat enough food. Talk with your doctor before you start any new diet.  Take over-the-counter and prescription medicines only as told by your doctor.  Limit alcohol to no more than 1 drink per day for nonpregnant women and 2 drinks per day for men. One drink equals 12 oz of beer, 5 oz of wine, or 1 oz of hard liquor.  Keep all follow-up visits as told by  your doctor. This is important. If You Have Diabetes:   Make sure you know the symptoms of low blood sugar.  Always keep a source of sugar with you, such as: ? Sugar. ? Sugar tablets. ? Glucose gel. ? Fruit juice. ? Regular soda (not diet soda). ? Milk. ? Hard candy. ? Honey.  Take your medicines as told.  Follow your exercise and meal plan. ? Eat on time. Do not skip meals. ? Follow your sick day plan when you cannot eat or drink normally. Make this plan ahead of time with your doctor.  Check your blood sugar as often as told by your doctor. Always check before and after exercise.  Share your diabetes care plan  with: ? Your work or school. ? People you live with.  Check your pee (urine) for ketones: ? When you are sick. ? As told by your doctor.  Carry a card or wear jewelry that says you have diabetes. If You Have Low Blood Sugar From Other Causes:   Check your blood sugar as often as told by your doctor.  Follow instructions from your doctor about what you cannot eat or drink. Contact a doctor if:  You have trouble keeping your blood sugar in your target range.  You have low blood sugar often. Get help right away if:  You still have symptoms after you eat or drink something sugary.  Your blood sugar is at or below 54 mg/dL (3 mmol/L).  You have jerky movements that you cannot control.  You pass out. These symptoms may be an emergency. Do not wait to see if the symptoms will go away. Get medical help right away. Call your local emergency services (911 in the [place] Do not drive yourself to the hospital. This information is not intended to replace advice given to you by your health care provider. Make sure you discuss any questions you have with your health care provider. Document Released: 11/29/2009 Document Revised: 02/10/2016 Document Reviewed: 10/08/2015 Elsevier Interactive Patient Education  2018 [employer].   Postpartum Care After Cesarean Delivery The period of time right after you deliver your newborn is called the postpartum period. What kind of medical care will I receive?  You may continue to receive fluids and medicines through an IV tube inserted into one of your veins.  You may have small, flexible tube (catheter) draining urine from your bladder into a bag outside of your body. The catheter will be removed as soon as possible.  You may be given a squirt bottle to use when you go to the bathroom. You may use this until you are comfortable wiping as usual. To use the squirt bottle, follow these steps: ? Before you urinate, fill the squirt bottle with warm water.  The water should be warm. Do not use hot water. ? After you urinate, while you are sitting on the toilet, use the squirt bottle to rinse the area around your urethra and vaginal opening. This rinses away any urine and blood. ? You may do this instead of wiping. As you start healing, you may use the squirt bottle before wiping yourself. Make sure to wipe gently. ? Fill the squirt bottle with clean water every time you use the bathroom.  You will be given sanitary pads to wear.  Your incision will be monitored to make sure it is healing properly. You will be told when it is safe for your stitches, staples, or skin adhesive tape to be removed. What can I  expect?  You may not feel the need to urinate for several hours after delivery.  You will have some soreness and pain in your abdomen. You may have a small amount of blood or clear fluid coming from your incision.  If you are breastfeeding, you may have uterine contractions every time you breastfeed for up to several weeks postpartum. Uterine contractions help your uterus return to its normal size.  It is normal to have vaginal bleeding (lochia) after delivery. The amount and appearance of lochia is often similar to a menstrual period in the first week after delivery. It will gradually decrease over the next few weeks to a dry, yellow-brown discharge. For most women, lochia stops completely by 6-8 weeks after delivery. Vaginal bleeding can vary from woman to woman.  Within the first few days after delivery, you may have breast engorgement. This is when your breasts feel heavy, full, and uncomfortable. Your breasts may also throb and feel hard, tightly stretched, warm, and tender. After this occurs, you may have milk leaking from your breasts.Your health care provider can help you relieve discomfort due to breast engorgement. Breast engorgement should go away within a few days.  You may feel more sad or worried than normal due to hormonal changes  after delivery. These feelings should not last more than a few days. If these feelings do not go away after several days, speak with your health care provider. How should I care for myself?  Tell your health care provider if you have pain or discomfort.  Drink enough water to keep your urine clear or pale yellow.  Wash your hands thoroughly with soap and water for at least 20 seconds after changing your sanitary pads or using the toilet, and before holding or feeding your baby.  If you are not breastfeeding, avoid touching your breasts a lot. Doing this can make your breasts produce more milk.  If you become weak or lightheaded, or you feel like you might faint, ask for help before: ? Getting out of bed. ? Showering.  Change your sanitary pads frequently. Watch for any changes in your flow, such as a sudden increase in volume, a change in color, or the passing of large blood clots. If you pass a blood clot from your vagina, save it to show to your health care provider. Do not flush blood clots down the toilet without having your health care provider look at them.  Make sure that all your vaccinations are up to date. This can help protect you and your baby from getting certain diseases. You may need to have immunizations done before you leave the hospital.  If desired, talk with your health care provider about methods of family planning or birth control (contraception). How can I start bonding with my baby? Spending as much time as possible with your baby is very important. During this time, you and your baby can get to know each other and develop a bond. Having your baby stay with you in your room (rooming in) can give you time to get to know your baby. Rooming in can also help you become comfortable caring for your baby. Breastfeeding can also help you bond with your baby. How can I plan for returning home with my baby?  Make sure that you have a car seat installed in your vehicle. ? Your  car seat should be checked by a certified car seat installer to make sure that it is installed safely. ? Make sure that your baby  fits into the car seat safely.  Ask your health care provider any questions you have about caring for yourself or your baby. Make sure that you are able to contact your health care provider with any questions after leaving the hospital. This information is not intended to replace advice given to you by your health care provider. Make sure you discuss any questions you have with your health care provider. Document Released: 05/29/2012 Document Revised: 02/07/2016 Document Reviewed: 08/09/2015 Elsevier Interactive Patient Education  [employer].

## 2018-07-09 NOTE — Progress Notes (Signed)
Megan Silva states she understands how to check her blood sugars, understands insulin dosages, how to draw up and administer insulin. She states she is able to get her insulin, syringes and glucose meter since her baby continues to be a patient at [facility].

## 2018-07-09 NOTE — Progress Notes (Signed)
Inpatient Diabetes Program Recommendations  AACE/ADA: New Consensus Statement on Inpatient Glycemic Control (2015)  Target Ranges:  Prepandial:   less than 140 mg/dL      Peak postprandial:   less than 180 mg/dL (1-2 hours)      Critically ill patients:  140 - 180 mg/dL   Lab Results  Component Value Date   GLUCAP 82 07/09/2018   HGBA1C 8.8 (H) 05/27/2018    Review of Glycemic Control Results for DESHANDA, MOLITOR (MRN [id number]) as of 07/09/2018 09:33  Ref. Range 07/09/2018 00:21 07/09/2018 09:00 07/09/2018 09:20  Glucose-Capillary Latest Ref Range: 70 - 99 mg/dL 536 (H) 46 (L) 82   Diabetes history: Type 1 DM Outpatient Diabetes medications: Lantus 36 units BID, Humalog 14 units TID Current orders for Inpatient glycemic control: Novolog 0-16 units TID+HS, Lantus 20 units BID, Novolog 8 units TID Decadron 10 mg on 10/19  Inpatient Diabetes Program Recommendations:    Noted hypoglycemic episode of 46 mg/dL this AM. Would recommend decreasing Lantus further to 15 units BID.   Thanks, Lujean Rave, MSN, RNC-OB Diabetes Coordinator [phone] (8a-5p)

## 2018-07-09 NOTE — Progress Notes (Signed)
CBG 46 Pt is diaphoretic.4oz apple juice taken. Ptr requests additional 4oz cranberry juice Plan recheck Glucose in 15 min. VS BP 136/91, HR 100, Temp 97.5, Oxygen 100%.  AM insulin.has not been given.

## 2018-07-10 ENCOUNTER — Ambulatory Visit: Payer: Self-pay

## 2018-07-10 ENCOUNTER — Encounter: Payer: Self-pay | Admitting: Obstetrics and Gynecology

## 2018-07-10 ENCOUNTER — Other Ambulatory Visit: Payer: Self-pay

## 2018-07-10 NOTE — Lactation Note (Signed)
This note was copied from a baby's chart. Lactation Consultation Note  Patient Name: Megan Silva [id number] Date: 07/10/2018 Reason for consult: Follow-up assessment;Early term 37-38.6wks;Hyperbilirubinemia Mom states baby is latching better.  She continues to pump every 3 hours and bottle feeds expressed milk.  She obtained 120 mls with last pumping.  Mom does not have a DEBP at home.  Discussed WIC loaner and she will let us know if interested.  She has a manual pump.  Phototherapy discontinued this morning and bili will be drawn at 1400.  Possible discharge later today.  Encouraged to call for latch assist/concerns prn.  Maternal Data    Feeding Feeding Type: Bottle Fed - Formula Nipple Type: Slow - flow  LATCH Score                   Interventions    Lactation Tools Discussed/Used     Consult Status Consult Status: Follow-up Date: 07/11/18 Follow-up type: In-patient    Huston Foley 07/10/2018, 10:21 AM

## 2018-07-17 ENCOUNTER — Other Ambulatory Visit: Payer: Self-pay

## 2018-07-17 ENCOUNTER — Encounter: Payer: Self-pay | Admitting: Obstetrics and Gynecology

## 2018-07-19 ENCOUNTER — Ambulatory Visit: Payer: Self-pay

## 2018-07-25 ENCOUNTER — Ambulatory Visit (INDEPENDENT_AMBULATORY_CARE_PROVIDER_SITE_OTHER): Payer: Medicaid Other | Admitting: *Deleted

## 2018-07-25 VITALS — BP 123/74 | HR 97 | Wt 169.0 lb

## 2018-07-25 DIAGNOSIS — Z5189 Encounter for other specified aftercare: Secondary | ICD-10-CM

## 2018-07-25 NOTE — Progress Notes (Signed)
Agree with A & P. 

## 2018-07-25 NOTE — Progress Notes (Signed)
Here for wound check s/p c/s 07/06/18 . Wound clean, dry, intact with few steristrips still intact. Removed loose steristrips- only 4 left . Gave patient option me removing steristrips or she remove them tonight after shower- she states she will remove after shower.Instructed to keep pp appt as scheduled. She voice understanding.

## 2018-08-08 ENCOUNTER — Ambulatory Visit: Payer: Self-pay | Admitting: Obstetrics and Gynecology

## 2019-04-05 ENCOUNTER — Encounter (HOSPITAL_COMMUNITY): Payer: Self-pay | Admitting: Emergency Medicine

## 2019-04-05 ENCOUNTER — Other Ambulatory Visit: Payer: Self-pay

## 2019-04-05 ENCOUNTER — Ambulatory Visit (HOSPITAL_COMMUNITY)
Admission: EM | Admit: 2019-04-05 | Discharge: 2019-04-05 | Disposition: A | Discharge status: Home / Self Care | Payer: Medicaid Other | Attending: Family Medicine | Admitting: Family Medicine

## 2019-04-05 DIAGNOSIS — E109 Type 1 diabetes mellitus without complications: Secondary | ICD-10-CM

## 2019-04-05 DIAGNOSIS — O24913 Unspecified diabetes mellitus in pregnancy, third trimester: Secondary | ICD-10-CM

## 2019-04-05 DIAGNOSIS — Z760 Encounter for issue of repeat prescription: Secondary | ICD-10-CM

## 2019-04-05 MED ORDER — INSULIN GLARGINE 100 UNIT/ML ~~LOC~~ SOLN: 15.0000 [IU] | Freq: Two times a day (BID) | SUBCUTANEOUS | 2 refills | Status: DC

## 2019-04-05 MED ORDER — INSULIN LISPRO 100 UNIT/ML ~~LOC~~ SOLN: 4.0000 [IU] | Freq: Three times a day (TID) | SUBCUTANEOUS | 2 refills | Status: DC

## 2019-04-05 NOTE — ED Triage Notes (Signed)
Pt states she needs a refill on her insulin.

## 2019-04-07 NOTE — ED Provider Notes (Signed)
[facility]   [id number] 04/05/19 Arrival Time: 1545  ASSESSMENT & PLAN:  1. Type 1 diabetes mellitus without complication (HCC)      Meds ordered this encounter  Medications  . insulin glargine (LANTUS) 100 UNIT/ML injection    Sig: Inject 0.15 mLs (15 Units total) into the skin 2 (two) times daily.    Dispense:  10 mL    Refill:  2  . insulin lispro (HUMALOG) 100 UNIT/ML injection    Sig: Inject 0.04 mLs (4 Units total) into the skin 3 (three) times daily before meals.    Dispense:  10 mL    Refill:  2   Looking to est care with PCP. May f/u here as needed. Declines finger stick blood sugar here.  Reviewed expectations re: course of current medical issues. Questions answered. Outlined signs and symptoms indicating need for more acute intervention. Patient verbalized understanding. After Visit Summary given.   SUBJECTIVE: History from: patient. Megan Silva is a 23 y.o. female who presents requesting medication refill. No current concerns. Blood sugars have been running slightly higher than usual. No symptoms. Feels her DM is controlled. No current PCP.  Current medical problems include: Past Medical History:  Diagnosis Date  . Diabetes type 1, controlled (HCC)     Requests refill of:  .  insulin glargine (LANTUS) 100 UNIT/ML injection, Inject 0.15 mLs (15 Units total) into the skin 2 (two) times daily. .  insulin lispro (HUMALOG) 100 UNIT/ML injection, Inject 0.04 mLs (4 Units total) into the skin 3 (three) times daily before meals.  ROS: As per HPI.   OBJECTIVE:  Vitals:   04/05/19 1628  BP: 114/78  Pulse: 89  Resp: 19  Temp: 98 F (36.7 C)  SpO2: 99%    General appearance: alert; no distress Eyes: PERRLA; EOMI; conjunctiva normal HENT: normocephalic; atraumatic Neck: supple  Lungs: unlabored respirations Abdomen: soft, non-tender Extremities: no edema; symmetrical with no gross deformities Skin: warm and dry Neurologic: normal gait  Psychological: alert and cooperative; normal mood and affect  No Known Allergies  Past Medical History:  Diagnosis Date  . Diabetes type 1, controlled (HCC)     Social History   Socioeconomic History  . Marital status: Single    Spouse name: Not on file  . Number of children: Not on file  . Years of education: Not on file  . Highest education level: Not on file  Occupational History  . Not on file  Social Needs  . Financial resource strain: Not on file  . Food insecurity    Worry: Not on file    Inability: Not on file  . Transportation needs    Medical: Not on file    Non-medical: Not on file  Tobacco Use  . Smoking status: Never Smoker  . Smokeless tobacco: Never Used  Substance and Sexual Activity  . Alcohol use: Never    Frequency: Never  . Drug use: Never  . Sexual activity: Yes    Birth control/protection: None  Lifestyle  . Physical activity    Days per week: Not on file    Minutes per session: Not on file  . Stress: Not on file  Relationships  . Social Musicianconnections    Talks on phone: Not on file    Gets together: Not on file    Attends religious service: Not on file    Active member of club or organization: Not on file    Attends meetings of clubs or organizations:  Not on file    Relationship status: Not on file  . Intimate partner violence    Fear of current or ex partner: Not on file    Emotionally abused: Not on file    Physically abused: Not on file    Forced sexual activity: Not on file  Other Topics Concern  . Not on file  Social History Narrative  . Not on file   Family History  Problem Relation Age of Onset  . Diabetes Maternal Grandmother   . Cancer Paternal Grandmother    Past Surgical History:  Procedure Laterality Date  . CESAREAN SECTION N/A 07/06/2018   Procedure: CESAREAN SECTION;  Surgeon: Chancy Milroy, MD;  Location: [facility];  Service: Obstetrics;  Laterality: N/A;  . WISDOM TOOTH EXTRACTION       Vanessa Kick, MD 04/07/19 [phone]

## 2019-05-05 ENCOUNTER — Emergency Department (HOSPITAL_COMMUNITY)
Admission: EM | Admit: 2019-05-05 | Discharge: 2019-05-05 | Discharge status: Against Medical Advice | Payer: Self-pay | Attending: Emergency Medicine | Admitting: Emergency Medicine

## 2019-05-05 ENCOUNTER — Other Ambulatory Visit: Payer: Self-pay

## 2019-05-05 ENCOUNTER — Encounter (HOSPITAL_COMMUNITY): Payer: Self-pay

## 2019-05-05 DIAGNOSIS — Z5321 Procedure and treatment not carried out due to patient leaving prior to being seen by health care provider: Secondary | ICD-10-CM | POA: Insufficient documentation

## 2019-05-05 LAB — CBG MONITORING, ED: Glucose-Capillary: 350 mg/dL — ABNORMAL HIGH (ref 70–99)

## 2019-05-05 NOTE — ED Triage Notes (Signed)
Pt states that her sugars have been going up and down, as high as 390. Pt is poor historian and is difficult to get story.  Pt states that she was in the car and went to throw up out the window. Pt states she then lost consciousness. This happened at least 2x.

## 2019-06-19 ENCOUNTER — Ambulatory Visit: Payer: Self-pay

## 2019-06-19 NOTE — Telephone Encounter (Signed)
  Pt. Reports she had chest pain this morning while walking. Pain was in the middle and to the right. Lasted a few seconds.No radiation, no sweating. Had some dizziness and shortness of breath. Went away when she stopped to rest. No chest now. Instructed to go to ED if chest pain returns. Verbalizes understanding. Answer Assessment - Initial Assessment Questions 1. LOCATION: "Where does it hurt?"       Middle and to the right 2. RADIATION: "Does the pain go anywhere else?" (e.g., into neck, jaw, arms, back)     No 3. ONSET: "When did the chest pain begin?" (Minutes, hours or days)      This morning 4. PATTERN "Does the pain come and go, or has it been constant since it started?"  "Does it get worse with exertion?"      Comes an goes 5. DURATION: "How long does it last" (e.g., seconds, minutes, hours)     A few minutes 6. SEVERITY: "How bad is the pain?"  (e.g., Scale 1-10; mild, moderate, or severe)    - MILD (1-3): doesn't interfere with normal activities     - MODERATE (4-7): interferes with normal activities or awakens from sleep    - SEVERE (8-10): excruciating pain, unable to do any normal activities       2 7. CARDIAC RISK FACTORS: "Do you have any history of heart problems or risk factors for heart disease?" (e.g., angina, prior heart attack; diabetes, high blood pressure, high cholesterol, smoker, or strong family history of heart disease)     No 8. PULMONARY RISK FACTORS: "Do you have any history of lung disease?"  (e.g., blood clots in lung, asthma, emphysema, birth control pills)     Asthma 9. CAUSE: "What do you think is causing the chest pain?"     Stress 10. OTHER SYMPTOMS: "Do you have any other symptoms?" (e.g., dizziness, nausea, vomiting, sweating, fever, difficulty breathing, cough)       Shortness of breath, dizzy 11. PREGNANCY: "Is there any chance you are pregnant?" "When was your last menstrual period?"       No  Protocols used: CHEST PAIN-A-AH

## 2019-06-24 ENCOUNTER — Inpatient Hospital Stay (HOSPITAL_COMMUNITY)
Admission: EM | Admit: 2019-06-24 | Discharge: 2019-06-25 | DRG: 639 | Discharge status: Against Medical Advice | Payer: Self-pay | Attending: Internal Medicine | Admitting: Internal Medicine

## 2019-06-24 DIAGNOSIS — Z833 Family history of diabetes mellitus: Secondary | ICD-10-CM

## 2019-06-24 DIAGNOSIS — Z20828 Contact with and (suspected) exposure to other viral communicable diseases: Secondary | ICD-10-CM | POA: Diagnosis present

## 2019-06-24 DIAGNOSIS — E1010 Type 1 diabetes mellitus with ketoacidosis without coma: Principal | ICD-10-CM | POA: Diagnosis present

## 2019-06-24 DIAGNOSIS — E1110 Type 2 diabetes mellitus with ketoacidosis without coma: Secondary | ICD-10-CM

## 2019-06-24 DIAGNOSIS — Z9114 Patient's other noncompliance with medication regimen: Secondary | ICD-10-CM

## 2019-06-24 DIAGNOSIS — Z5329 Procedure and treatment not carried out because of patient's decision for other reasons: Secondary | ICD-10-CM | POA: Diagnosis present

## 2019-06-24 DIAGNOSIS — Z794 Long term (current) use of insulin: Secondary | ICD-10-CM

## 2019-06-24 DIAGNOSIS — O24913 Unspecified diabetes mellitus in pregnancy, third trimester: Secondary | ICD-10-CM

## 2019-06-24 HISTORY — DX: Type 2 diabetes mellitus with ketoacidosis without coma: E11.10

## 2019-06-24 LAB — CBC
HCT: 45.5 % (ref 36.0–46.0)
Hemoglobin: 14 g/dL (ref 12.0–15.0)
MCH: 24.7 pg — ABNORMAL LOW (ref 26.0–34.0)
MCHC: 30.8 g/dL (ref 30.0–36.0)
MCV: 80.4 fL (ref 80.0–100.0)
Platelets: 455 10*3/uL — ABNORMAL HIGH (ref 150–400)
RBC: 5.66 MIL/uL — ABNORMAL HIGH (ref 3.87–5.11)
RDW: 17 % — ABNORMAL HIGH (ref 11.5–15.5)
WBC: 14.2 10*3/uL — ABNORMAL HIGH (ref 4.0–10.5)
nRBC: 0 % (ref 0.0–0.2)

## 2019-06-24 LAB — BASIC METABOLIC PANEL
Anion gap: 26 — ABNORMAL HIGH (ref 5–15)
Anion gap: 16 — ABNORMAL HIGH (ref 5–15)
BUN: 16 mg/dL (ref 6–20)
BUN: 13 mg/dL (ref 6–20)
CO2: 11 mmol/L — ABNORMAL LOW (ref 22–32)
CO2: 13 mmol/L — ABNORMAL LOW (ref 22–32)
Calcium: 9.7 mg/dL (ref 8.9–10.3)
Calcium: 8.5 mg/dL — ABNORMAL LOW (ref 8.9–10.3)
Chloride: 96 mmol/L — ABNORMAL LOW (ref 98–111)
Chloride: 106 mmol/L (ref 98–111)
Creatinine, Ser: 1.23 mg/dL — ABNORMAL HIGH (ref 0.44–1.00)
Creatinine, Ser: 0.92 mg/dL (ref 0.44–1.00)
GFR calc Af Amer: >60 mL/min (ref >60)
GFR calc Af Amer: >60 mL/min (ref >60)
GFR calc non Af Amer: >60 mL/min (ref >60)
GFR calc non Af Amer: >60 mL/min (ref >60)
Glucose, Bld: 576 mg/dL (ref 70–99)
Glucose, Bld: 172 mg/dL — ABNORMAL HIGH (ref 70–99)
Potassium: 5.5 mmol/L — ABNORMAL HIGH (ref 3.5–5.1)
Potassium: 4.9 mmol/L (ref 3.5–5.1)
Sodium: 133 mmol/L — ABNORMAL LOW (ref 135–145)
Sodium: 135 mmol/L (ref 135–145)

## 2019-06-24 LAB — HIV ANTIBODY (ROUTINE TESTING W REFLEX): HIV Screen 4th Generation wRfx: NONREACTIVE

## 2019-06-24 LAB — CBG MONITORING, ED
Glucose-Capillary: 122 mg/dL — ABNORMAL HIGH (ref 70–99)
Glucose-Capillary: 150 mg/dL — ABNORMAL HIGH (ref 70–99)
Glucose-Capillary: 153 mg/dL — ABNORMAL HIGH (ref 70–99)
Glucose-Capillary: 189 mg/dL — ABNORMAL HIGH (ref 70–99)
Glucose-Capillary: 195 mg/dL — ABNORMAL HIGH (ref 70–99)
Glucose-Capillary: 282 mg/dL — ABNORMAL HIGH (ref 70–99)
Glucose-Capillary: 541 mg/dL (ref 70–99)
Glucose-Capillary: 572 mg/dL (ref 70–99)

## 2019-06-24 LAB — POCT I-STAT EG7
Acid-base deficit: 13 mmol/L — ABNORMAL HIGH (ref 0.0–2.0)
Bicarbonate: 12 mmol/L — ABNORMAL LOW (ref 20.0–28.0)
Calcium, Ion: 1.16 mmol/L (ref 1.15–1.40)
HCT: 36 % (ref 36.0–46.0)
Hemoglobin: 12.2 g/dL (ref 12.0–15.0)
O2 Saturation: 79 %
Potassium: 4.2 mmol/L (ref 3.5–5.1)
Sodium: 138 mmol/L (ref 135–145)
TCO2: 13 mmol/L — ABNORMAL LOW (ref 22–32)
pCO2, Ven: 25.6 mmHg — ABNORMAL LOW (ref 44.0–60.0)
pH, Ven: 7.277 (ref 7.250–7.430)
pO2, Ven: 48 mmHg — ABNORMAL HIGH (ref 32.0–45.0)

## 2019-06-24 LAB — BASIC METABOLIC PANEL WITH GFR
Anion gap: 10 (ref 5–15)
BUN: 11 mg/dL (ref 6–20)
CO2: 14 mmol/L — ABNORMAL LOW (ref 22–32)
Calcium: 7.9 mg/dL — ABNORMAL LOW (ref 8.9–10.3)
Chloride: 111 mmol/L (ref 98–111)
Creatinine, Ser: 0.71 mg/dL (ref 0.44–1.00)
GFR calc Af Amer: >60 mL/min
GFR calc non Af Amer: >60 mL/min
Glucose, Bld: 110 mg/dL — ABNORMAL HIGH (ref 70–99)
Potassium: 4.5 mmol/L (ref 3.5–5.1)
Sodium: 135 mmol/L (ref 135–145)

## 2019-06-24 LAB — URINALYSIS, ROUTINE W REFLEX MICROSCOPIC
Bacteria, UA: NONE SEEN
Bilirubin Urine: NEGATIVE
Glucose, UA: >=500 mg/dL — AB
Ketones, ur: 80 mg/dL — AB
Leukocytes,Ua: NEGATIVE
Nitrite: NEGATIVE
Protein, ur: NEGATIVE mg/dL
Specific Gravity, Urine: 1.026 (ref 1.005–1.030)
pH: 5 (ref 5.0–8.0)

## 2019-06-24 LAB — I-STAT BETA HCG BLOOD, ED (MC, WL, AP ONLY): I-stat hCG, quantitative: <5 m[IU]/mL (ref <5)

## 2019-06-24 LAB — SARS CORONAVIRUS 2 BY RT PCR (HOSPITAL ORDER, PERFORMED IN ~~LOC~~ HOSPITAL LAB): SARS Coronavirus 2: NEGATIVE

## 2019-06-24 LAB — BETA-HYDROXYBUTYRIC ACID: Beta-Hydroxybutyric Acid: 4.7 mmol/L — ABNORMAL HIGH (ref 0.05–0.27)

## 2019-06-24 LAB — MAGNESIUM: Magnesium: 2 mg/dL (ref 1.7–2.4)

## 2019-06-24 LAB — PHOSPHORUS: Phosphorus: 3 mg/dL (ref 2.5–4.6)

## 2019-06-24 MED ORDER — FENTANYL CITRATE (PF) 100 MCG/2ML IJ SOLN
25.0000 ug | Freq: Once | INTRAMUSCULAR | Status: DC
  Filled 2019-06-24: qty 2

## 2019-06-24 MED ORDER — ENOXAPARIN SODIUM 40 MG/0.4ML ~~LOC~~ SOLN
40.0000 mg | SUBCUTANEOUS | Status: DC
  Filled 2019-06-24: qty 0.4

## 2019-06-24 MED ORDER — SODIUM CHLORIDE 0.9 % IV SOLN: INTRAVENOUS | Status: AC

## 2019-06-24 MED ORDER — ONDANSETRON HCL 4 MG PO TABS: 4.0000 mg | ORAL_TABLET | Freq: Four times a day (QID) | ORAL | Status: DC | PRN

## 2019-06-24 MED ORDER — DEXTROSE-NACL 5-0.45 % IV SOLN
INTRAVENOUS | Status: DC
  Administered 2019-06-24: 20:00:00 via INTRAVENOUS

## 2019-06-24 MED ORDER — INSULIN REGULAR(HUMAN) IN NACL 100-0.9 UT/100ML-% IV SOLN
INTRAVENOUS | Status: DC
  Administered 2019-06-24: 4.4 [IU]/h via INTRAVENOUS

## 2019-06-24 MED ORDER — SODIUM CHLORIDE 0.9 % IV BOLUS
1000.0000 mL | Freq: Once | INTRAVENOUS | Status: AC
  Administered 2019-06-24: 1000 mL via INTRAVENOUS

## 2019-06-24 MED ORDER — ONDANSETRON HCL 4 MG/2ML IJ SOLN: 4.0000 mg | Freq: Four times a day (QID) | INTRAMUSCULAR | Status: DC | PRN

## 2019-06-24 MED ORDER — DEXTROSE-NACL 5-0.45 % IV SOLN: INTRAVENOUS | Status: DC

## 2019-06-24 MED ORDER — SODIUM CHLORIDE 0.9 % IV SOLN: INTRAVENOUS | Status: DC

## 2019-06-24 MED ORDER — INSULIN REGULAR(HUMAN) IN NACL 100-0.9 UT/100ML-% IV SOLN
INTRAVENOUS | Status: DC
  Administered 2019-06-24: 5.1 [IU]/h via INTRAVENOUS
  Filled 2019-06-24: qty 100

## 2019-06-24 MED ORDER — ONDANSETRON HCL 4 MG/2ML IJ SOLN
4.0000 mg | Freq: Once | INTRAMUSCULAR | Status: AC
  Administered 2019-06-24: 4 mg via INTRAVENOUS
  Filled 2019-06-24: qty 2

## 2019-06-24 NOTE — ED Notes (Signed)
Paging internal med coverage, per pt request to ask if they will allow her to eat.

## 2019-06-24 NOTE — ED Notes (Signed)
Pt vomited while I was in room pt and floor cleaned up. Pt then ambulated to RR

## 2019-06-24 NOTE — TOC Initial Note (Signed)
Transition of Care [facility]) - Initial/Assessment Note    Patient Details  Name: Megan Silva MRN: [id number] Date of Birth: [birth year]  [facility] CM/SW Contact:    Apolonio Schneiders, RN Phone Number: 06/24/2019, 6:20 PM  Clinical Narrative:                 ED CM spoke with the patient at the bedside. Provided patient with contact information for [facility] and [employer]. Patient states she had an appointment tomorrow with [facility] but informed the office she is being admitted to the hospital when they called to remind her of the apponitment. States she may need assistance with transportation due to her sister having car trouble. Patient can ride the bus if needed. She moved here from [place] in July. Patient asked where she can take her 8 year old son for primary care. CM provided the telephone number for [facility]. Patient states her Medicaid application is pending.   Expected Discharge Plan: Home/Self Care Barriers to Discharge: Continued Medical Work up   Patient Goals and CMS Choice        Expected Discharge Plan and Services Expected Discharge Plan: Home/Self Care In-house Referral: Clinical Social Work                                            Prior Living Arrangements/Services   Lives with:: Minor Children                   Activities of Daily Living      Permission Sought/Granted                  Emotional Assessment              Admission diagnosis:  N/V, Weakness Patient Active Problem List   Diagnosis Date Noted  . DKA (diabetic ketoacidoses) ([place]) 06/24/2019  . Post-operative state 07/06/2018  . PROM (premature rupture of membranes) 07/05/2018  . GBS (group b Streptococcus) UTI complicating pregnancy, unspecified trimester 06/10/2018  . Supervision of high risk pregnancy, antepartum, third trimester 05/27/2018  . Diabetes mellitus complicating pregnancy  22/29/7989  . GBS bacteriuria 05/27/2018   PCP:  Patient, No Pcp Per Pharmacy:   [id number] - [place], [address] [place] [address] Phone: [phone] Fax: [id number] [place], [place] - [place] [address] [place] [address] Phone: [phone] Fax: [phone]     Social Determinants of Health (SDOH) Interventions    Readmission Risk Interventions No flowsheet data found.

## 2019-06-24 NOTE — H&P (Signed)
NAME:  Megan Silva, MRN:  [id number], DOB:  1996-04-03, LOS: 0 ADMISSION DATE:  06/24/2019, Primary: Patient, No Pcp Per  CHIEF COMPLAINT:  abd pain, vomitting  Medical Service: Internal Medicine Teaching Service         Attending Physician: Dr. Velna Ochs, MD    First Contact: Dr. Darrick Meigs Pager: [phone]  Second Contact: Dr. Sharon Seller Pager: [phone]       After Hours (After 5p/  First Contact Pager: [phone]  weekends / holidays): Second Contact Pager: [phone]    History of present illness   23 yo female with type 1 DM presenting for evaluation of hyperglycemia and emesis x1d. Pt notes that she ran out of lantus approx one month ago and has only been taking her humalog. Endorses elevated blood sugars since that time. Denies recent fever, chills, cough, weakness or dizziness. Endorses increased urination over past few days. Notes reason for running out of lantus relates to having recently moved back to [place] and having lost touch with her endocrinologist back in [place]. No recent sick contacts  Past Medical History  She,  has a past medical history of Diabetes type 1, controlled ([facility]).   Home Medications     Prior to Admission medications   Medication Sig Start Date End Date Taking? Authorizing Provider  insulin glargine (LANTUS) 100 UNIT/ML injection Inject 0.15 mLs (15 Units total) into the skin 2 (two) times daily. 04/05/19  Yes Hagler, Aaron Edelman, MD  insulin lispro (HUMALOG) 100 UNIT/ML injection Inject 0.04 mLs (4 Units total) into the skin 3 (three) times daily before meals. 04/05/19  Yes Hagler, Aaron Edelman, MD  Insulin Pen Needle (PEN NEEDLES 31GX5/16") 31G X 8 MM MISC 1 Dose by Does not apply route 4 (four) times daily. 05/31/18   Burleson, Rona Ravens, NP  Insulin Syringe-Needle U-100 (INSULIN SYRINGE .3CC/31GX5/16") 31G X 5/16" 0.3 ML MISC 1 Syringe by Does not apply route 5 (five) times daily. 06/04/18   Emily Filbert, MD    Allergies    Allergies as of 06/24/2019   . (No Known Allergies)    Social History   reports that she has never smoked. She has never used smokeless tobacco. She reports current alcohol use. She reports that she does not use drugs.   Family History   Her family history includes Cancer in her paternal grandmother; Diabetes in her maternal grandmother.    ROS  Review of Systems  Constitutional: Positive for malaise/fatigue. Negative for chills and fever.  HENT: Negative for congestion and sore throat.   Eyes: Negative for blurred vision.  Respiratory: Positive for shortness of breath. Negative for cough.   Cardiovascular: Negative for chest pain and palpitations.  Gastrointestinal: Positive for abdominal pain, nausea and vomiting. Negative for diarrhea and heartburn.  Genitourinary: Positive for frequency. Negative for dysuria, flank pain and urgency.  Musculoskeletal: Negative for myalgias.  Skin: Negative for itching and rash.  Neurological: Negative for dizziness and headaches.  Psychiatric/Behavioral: Negative for depression.    Objective   Blood pressure 123/76, pulse 88, temperature 97.7 F (36.5 C), temperature source Oral, resp. rate 18, SpO2 100 %, unknown if currently breastfeeding.    No intake or output data in the 24 hours ending 06/24/19 1825 There were no vitals filed for this visit.  Examination: GENERAL: in no acute distress HEENT: head atraumatic. No conjunctival injection. Nares patent.  CARDIAC: heart RRR. No peripheral edema.  PULMONARY: acyanotic. Lung sounds clear to auscultation. ABDOMEN: soft. Nontender to palpation.  Nondistended. No organomegaly appreciated. NEURO: CN II-XII grossly intact. SKIN: no rash or lesions on limited exam  PSYCH: A/Ox3. Normal affect  Consults:  none  Significant Diagnostic Tests:  CBG 572 UA + ketones, +hgb, +glucose HCO3 11, AG 26 K 5.5   Labs   CBC: Recent Labs  Lab 06/24/19 1411  WBC 14.2*  HGB 14.0  HCT 45.5  MCV 80.4  PLT 455*    Basic  Metabolic Panel: Recent Labs  Lab 06/24/19 1411  NA 133*  K 5.5*  CL 96*  CO2 11*  GLUCOSE 576*  BUN 16  CREATININE 1.23*  CALCIUM 9.7   CBG: Recent Labs  Lab 06/24/19 1405 06/24/19 1700  GLUCAP 572* 541*    Summary  23 yo female with DM type I presenting to ED for 1d hx of vomitting and malaise who was subsequently found to be in DKA.   Assessment & Plan:  Active Problems:   DKA (diabetic ketoacidoses) (HCC)   DKA. Pt with history of DM type I.  CBG 572, UA + ketones, +hgb, +glucose, venous HCO3 11, AG 26. No significant lab finds or sx for infectious etiology. Likely attributable to not taking lantus for past month. Plan IVF DKA protocol with IV insulin, D5 when CBG <250. Bmp q4. AG closed x2 prior to transition to lantus. BHB A1C Tele monitor  Best practice:  CODE STATUS: Diet:NPO Pain management:tylenol Glucose control:see above DVT for prophylaxis: lovenox Dispo: Admit patient to Inpatient with expected length of stay greater than 2 midnights. Will need appt to establish with endocrinologist at discharge.    Elige Radon, MD INTERNAL MEDICINE RESIDENT PGY-1 Pager # [phone] 06/24/19  6:25 PM

## 2019-06-24 NOTE — ED Notes (Signed)
Tele   Paged admitting Dr about pt requesting food

## 2019-06-24 NOTE — ED Provider Notes (Signed)
[facility] EMERGENCY DEPARTMENT Provider Note   CSN: [id number] Arrival date & time: 06/24/19  1357     History   Chief Complaint Chief Complaint  Patient presents with  . Hyperglycemia    HPI Megan Silva is a 23 y.o. female.     HPI   She presents for evaluation of high blood sugar readings, and being out of her Lantus insulin from 1 month.  She has been feeling ill for several days.  She complains of vomiting.  Initially seen by me at 2:50 PM.  At this time she is actively vomiting and unable to answer questions.  Level 5 caveat-acute illness.  Patient was alert, cooperative and able to participate in history and physical at 4:02 PM.-She states she has been taking only Humalog for 1 month because she ran out of Lantus.  She began vomiting today.  She denies fever, chills, cough, weakness or dizziness.  She does not yet have a PCP.  No known sick exposures.  There are no other known modifying factors.  Past Medical History:  Diagnosis Date  . Diabetes type 1, controlled [facility])     Patient Active Problem List   Diagnosis Date Noted  . Post-operative state 07/06/2018  . PROM (premature rupture of membranes) 07/05/2018  . GBS (group b Streptococcus) UTI complicating pregnancy, unspecified trimester 06/10/2018  . Supervision of high risk pregnancy, antepartum, third trimester 05/27/2018  . Diabetes mellitus complicating pregnancy 07/04/5101  . GBS bacteriuria 05/27/2018    Past Surgical History:  Procedure Laterality Date  . CESAREAN SECTION N/A 07/06/2018   Procedure: CESAREAN SECTION;  Surgeon: Megan Milroy, MD;  Location: [facility];  Service: Obstetrics;  Laterality: N/A;  . WISDOM TOOTH EXTRACTION       OB History    Gravida  1   Para  1   Term  1   Preterm  0   AB  0   Living  1     SAB  0   TAB  0   Ectopic  0   Multiple  0   Live Births  1            Home Medications    Prior to Admission  medications   Medication Sig Start Date End Date Taking? Authorizing Provider  insulin glargine (LANTUS) 100 UNIT/ML injection Inject 0.15 mLs (15 Units total) into the skin 2 (two) times daily. 04/05/19  Yes Megan Silva, Megan Edelman, MD  insulin lispro (HUMALOG) 100 UNIT/ML injection Inject 0.04 mLs (4 Units total) into the skin 3 (three) times daily before meals. 04/05/19  Yes Megan Silva, Megan Edelman, MD  Insulin Pen Needle (PEN NEEDLES 31GX5/16") 31G X 8 MM MISC 1 Dose by Does not apply route 4 (four) times daily. 05/31/18   Megan Silva, Megan Ravens, NP  Insulin Syringe-Needle U-100 (INSULIN SYRINGE .3CC/31GX5/16") 31G X 5/16" 0.3 ML MISC 1 Syringe by Does not apply route 5 (five) times daily. 06/04/18   Megan Filbert, MD    Family History Family History  Problem Relation Age of Onset  . Diabetes Maternal Grandmother   . Cancer Paternal Grandmother     Social History Social History   Tobacco Use  . Smoking status: Never Smoker  . Smokeless tobacco: Never Used  Substance Use Topics  . Alcohol use: Yes    Frequency: Never    Comment: social  . Drug use: Never     Allergies   Patient has no known allergies.  Review of Systems Review of Systems  Unable to perform ROS: Acuity of condition  All other systems reviewed and are negative.    Physical Exam Updated Vital Signs BP 123/76   Pulse 88   Temp 97.7 F (36.5 C) (Oral)   Resp 18   SpO2 100%   Physical Exam Vitals signs and nursing note reviewed.  Constitutional:      General: She is not in acute distress.    Appearance: She is well-developed. She is not ill-appearing or diaphoretic.  HENT:     Head: Normocephalic and atraumatic.     Right Ear: External ear normal.     Left Ear: External ear normal.  Eyes:     Conjunctiva/sclera: Conjunctivae normal.     Pupils: Pupils are equal, round, and reactive to light.  Neck:     Musculoskeletal: Normal range of motion and neck supple.     Trachea: Phonation normal.  Cardiovascular:     Rate  and Rhythm: Normal rate and regular rhythm.     Heart sounds: Normal heart sounds.  Pulmonary:     Effort: Pulmonary effort is normal.     Breath sounds: Normal breath sounds.  Abdominal:     General: There is no distension.     Palpations: Abdomen is soft.     Tenderness: There is no abdominal tenderness. There is no guarding.  Musculoskeletal: Normal range of motion.  Skin:    General: Skin is warm and dry.  Neurological:     Mental Status: She is alert and oriented to person, place, and time.     Cranial Nerves: No cranial nerve deficit.     Sensory: No sensory deficit.     Motor: No abnormal muscle tone.     Coordination: Coordination normal.  Psychiatric:        Mood and Affect: Mood normal.        Behavior: Behavior normal.        Thought Content: Thought content normal.        Judgment: Judgment normal.      ED Treatments / Results  Labs (all labs ordered are listed, but only abnormal results are displayed) Labs Reviewed  BASIC METABOLIC PANEL - Abnormal; Notable for the following components:      Result Value   Sodium 133 (*)    Potassium 5.5 (*)    Chloride 96 (*)    CO2 11 (*)    Glucose, Bld 576 (*)    Creatinine, Ser 1.23 (*)    Anion gap 26 (*)    All other components within normal limits  CBC - Abnormal; Notable for the following components:   WBC 14.2 (*)    RBC 5.66 (*)    MCH 24.7 (*)    RDW 17.0 (*)    Platelets 455 (*)    All other components within normal limits  URINALYSIS, ROUTINE W REFLEX MICROSCOPIC - Abnormal; Notable for the following components:   Color, Urine STRAW (*)    Glucose, UA >=500 (*)    Hgb urine dipstick MODERATE (*)    Ketones, ur 80 (*)    All other components within normal limits  CBG MONITORING, ED - Abnormal; Notable for the following components:   Glucose-Capillary 572 (*)    All other components within normal limits  I-STAT BETA HCG BLOOD, ED ([facility], [facility], [facility] ONLY)    EKG None  Radiology No results found.   Procedures .Critical Care Performed by: Megan BaleWentz, Megan Axon, MD Authorized  by: Megan Bale, MD   Critical care provider statement:    Critical care time (minutes):  35   Critical care start time:  06/24/2019 2:40 PM   Critical care end time:  06/24/2019 4:14 PM   Critical care time was exclusive of:  Separately billable procedures and treating other patients   Critical care was necessary to treat or prevent imminent or life-threatening deterioration of the following conditions:  Metabolic crisis   Critical care was time spent personally by me on the following activities:  Blood draw for specimens, development of treatment plan with patient or surrogate, discussions with consultants, evaluation of patient's response to treatment, examination of patient, obtaining history from patient or surrogate, ordering and performing treatments and interventions, ordering and review of laboratory studies, pulse oximetry, re-evaluation of patient's condition, review of old charts and ordering and review of radiographic studies   (including critical care time)  Medications Ordered in ED Medications  dextrose 5 %-0.45 % sodium chloride infusion (has no administration in time range)  insulin regular, human (MYXREDLIN) 100 units/ 100 mL infusion (5.1 Units/hr Intravenous New Bag/Given 06/24/19 1556)  sodium chloride 0.9 % bolus 1,000 mL (1,000 mLs Intravenous New Bag/Given 06/24/19 1539)    And  sodium chloride 0.9 % bolus 1,000 mL (has no administration in time range)    And  sodium chloride 0.9 % bolus 1,000 mL (has no administration in time range)    And  0.9 %  sodium chloride infusion (has no administration in time range)  fentaNYL (SUBLIMAZE) injection 25 mcg (25 mcg Intravenous Not Given 06/24/19 1600)  ondansetron (ZOFRAN) injection 4 mg (4 mg Intravenous Given 06/24/19 1537)     Initial Impression / Assessment and Plan / ED Course  I have reviewed the triage vital signs and the nursing notes.   Pertinent labs & imaging results that were available during my care of the patient were reviewed by me and considered in my medical decision making (see chart for details).  Clinical Course as of Jun 23 1613  Tue Jun 24, 2019  1559 Normal except glucose high, hemoglobin high, ketones high  Urinalysis, Routine w reflex microscopic(!) [EW]  1559 Normal except white count high, MCH low, platelets high  CBC(!) [EW]  1559 Normal  I-Stat beta hCG blood, ED [EW]  1559 Normal except sodium low, potassium high, chloride low, CO2 low, creatinine high anion gap high  Basic metabolic panel(!!) [EW]  1559 Elevated  CBG monitoring, ED(!!) [EW]    Clinical Course User Index [EW] Megan Bale, MD        Patient Vitals for the past 24 hrs:  BP Temp Temp src Pulse Resp SpO2  06/24/19 1405 123/76 97.7 F (36.5 C) Oral 88 18 100 %    4:0 PM Reevaluation with update and discussion. After initial assessment and treatment, an updated evaluation reveals she is much more comfortable.  She has declined to let nursing do a cover test, and will not specify why.  She was informed that she cannot be admitted to the hospital without a cover test.  When I told her that she had nothing additional to say.  We will proceed with treatment and try to encourage her to participate in COVID testing. Megan Silva   Medical Decision Making: DKA, with medication noncompliance.  Doubt serious bacterial infection or impending vascular collapse.  CRITICAL CARE-yes Performed by: Megan Silva   Nursing Notes Reviewed/ Care Coordinated Applicable Imaging Reviewed Interpretation of Laboratory Data incorporated into ED  treatment  Plan-ongoing treatment for DKA with possible admission if she agrees with testing as required.    Final Clinical Impressions(s) / ED Diagnoses   Final diagnoses:  Diabetic ketoacidosis without coma associated with type 1 diabetes mellitus (HCC)  Noncompliance with medication regimen     ED Discharge Orders    None       Megan Bale, MD 06/24/19 1614

## 2019-06-24 NOTE — ED Provider Notes (Signed)
  Provider Note MRN:  [id number]  Arrival date & time: 06/24/19    ED Course and Medical Decision Making  Assumed care from Dr. Eulis Foster at shift change.  DKA, will admit to stepdown unit, there was delay due to patient's refusal to be swab for COVID but she now consents to the test.  6 PM update: Accepted for admission to internal medicine service.  Critical Care Documentation Critical care time provided by me (excluding procedures): 31 minutes  Condition necessitating critical care: Diabetic ketoacidosis  Components of critical care management: reviewing of prior records, laboratory and imaging interpretation, frequent re-examination and reassessment of vital signs, administration of IV fluids, IV insulin, discussion with consulting services.    Final Clinical Impressions(s) / ED Diagnoses     ICD-10-CM   1. Diabetic ketoacidosis without coma associated with type 1 diabetes mellitus ([place])  E10.10   2. Noncompliance with medication regimen  Z91.14     ED Discharge Orders    None      Discharge Instructions   None     Barth Kirks. Sedonia Small, [place] [email or web address]    Maudie Flakes, MD 06/24/19 [phone]

## 2019-06-24 NOTE — ED Notes (Signed)
Pt refused to be tested.

## 2019-06-24 NOTE — ED Notes (Signed)
Attempted to rework glucose stabilizer with Drake Leach RN. Stopped and restarted GS but was not given option to change to DKA rather than hyperglycemia. EDP notified.

## 2019-06-24 NOTE — ED Triage Notes (Signed)
Pt states she is a type 1 diabetic and been out of lantus for over 1 month. Pt has been traveling and been out of state. Pt reports he blood sugar readings have been high over the last week and she began to get sick over the past 2-3 days with n/v.

## 2019-06-24 NOTE — ED Notes (Signed)
IM coverage called. Pt will be NPO until breakfast most likely, possibly around 4:00 am. Pt made aware.

## 2019-06-24 NOTE — ED Notes (Signed)
Pt is not cooperative about covid testing, wearing mask, or answering questions.

## 2019-06-25 ENCOUNTER — Other Ambulatory Visit: Payer: Self-pay | Admitting: Internal Medicine

## 2019-06-25 ENCOUNTER — Ambulatory Visit (INDEPENDENT_AMBULATORY_CARE_PROVIDER_SITE_OTHER): Payer: Self-pay | Admitting: Primary Care

## 2019-06-25 ENCOUNTER — Telehealth: Payer: Self-pay

## 2019-06-25 DIAGNOSIS — O24913 Unspecified diabetes mellitus in pregnancy, third trimester: Secondary | ICD-10-CM

## 2019-06-25 DIAGNOSIS — Z9114 Patient's other noncompliance with medication regimen: Secondary | ICD-10-CM | POA: Insufficient documentation

## 2019-06-25 DIAGNOSIS — Z5329 Procedure and treatment not carried out because of patient's decision for other reasons: Secondary | ICD-10-CM

## 2019-06-25 LAB — CBC WITH DIFFERENTIAL/PLATELET
Abs Immature Granulocytes: 0.03 10*3/uL (ref 0.00–0.07)
Basophils Absolute: 0.1 10*3/uL (ref 0.0–0.1)
Basophils Relative: 1 %
Eosinophils Absolute: 0.1 10*3/uL (ref 0.0–0.5)
Eosinophils Relative: 1 %
HCT: 35.5 % — ABNORMAL LOW (ref 36.0–46.0)
Hemoglobin: 10.7 g/dL — ABNORMAL LOW (ref 12.0–15.0)
Immature Granulocytes: 0 %
Lymphocytes Relative: 39 %
Lymphs Abs: 4.1 10*3/uL — ABNORMAL HIGH (ref 0.7–4.0)
MCH: 24.2 pg — ABNORMAL LOW (ref 26.0–34.0)
MCHC: 30.1 g/dL (ref 30.0–36.0)
MCV: 80.3 fL (ref 80.0–100.0)
Monocytes Absolute: 1 10*3/uL (ref 0.1–1.0)
Monocytes Relative: 9 %
Neutro Abs: 5.3 10*3/uL (ref 1.7–7.7)
Neutrophils Relative %: 50 %
Platelets: 372 10*3/uL (ref 150–400)
RBC: 4.42 MIL/uL (ref 3.87–5.11)
RDW: 16.7 % — ABNORMAL HIGH (ref 11.5–15.5)
WBC: 10.5 10*3/uL (ref 4.0–10.5)
nRBC: 0 % (ref 0.0–0.2)

## 2019-06-25 LAB — IRON AND TIBC
Iron: 45 ug/dL (ref 28–170)
Saturation Ratios: 16 % (ref 10.4–31.8)
TIBC: 284 ug/dL (ref 250–450)
UIBC: 239 ug/dL

## 2019-06-25 LAB — BASIC METABOLIC PANEL
Anion gap: 7 (ref 5–15)
Anion gap: 11 (ref 5–15)
Anion gap: 10 (ref 5–15)
Anion gap: 13 (ref 5–15)
BUN: 8 mg/dL (ref 6–20)
BUN: 10 mg/dL (ref 6–20)
BUN: 9 mg/dL (ref 6–20)
BUN: 9 mg/dL (ref 6–20)
CO2: 18 mmol/L — ABNORMAL LOW (ref 22–32)
CO2: 17 mmol/L — ABNORMAL LOW (ref 22–32)
CO2: 17 mmol/L — ABNORMAL LOW (ref 22–32)
CO2: 15 mmol/L — ABNORMAL LOW (ref 22–32)
Calcium: 8 mg/dL — ABNORMAL LOW (ref 8.9–10.3)
Calcium: 8.2 mg/dL — ABNORMAL LOW (ref 8.9–10.3)
Calcium: 8.5 mg/dL — ABNORMAL LOW (ref 8.9–10.3)
Calcium: 8.4 mg/dL — ABNORMAL LOW (ref 8.9–10.3)
Chloride: 110 mmol/L (ref 98–111)
Chloride: 108 mmol/L (ref 98–111)
Chloride: 107 mmol/L (ref 98–111)
Chloride: 104 mmol/L (ref 98–111)
Creatinine, Ser: 0.74 mg/dL (ref 0.44–1.00)
Creatinine, Ser: 0.7 mg/dL (ref 0.44–1.00)
Creatinine, Ser: 0.72 mg/dL (ref 0.44–1.00)
Creatinine, Ser: 0.75 mg/dL (ref 0.44–1.00)
GFR calc Af Amer: >60 mL/min (ref >60)
GFR calc Af Amer: >60 mL/min (ref >60)
GFR calc Af Amer: >60 mL/min (ref >60)
GFR calc Af Amer: >60 mL/min (ref >60)
GFR calc non Af Amer: >60 mL/min (ref >60)
GFR calc non Af Amer: >60 mL/min (ref >60)
GFR calc non Af Amer: >60 mL/min (ref >60)
GFR calc non Af Amer: >60 mL/min (ref >60)
Glucose, Bld: 164 mg/dL — ABNORMAL HIGH (ref 70–99)
Glucose, Bld: 214 mg/dL — ABNORMAL HIGH (ref 70–99)
Glucose, Bld: 203 mg/dL — ABNORMAL HIGH (ref 70–99)
Glucose, Bld: 338 mg/dL — ABNORMAL HIGH (ref 70–99)
Potassium: 3.8 mmol/L (ref 3.5–5.1)
Potassium: 3.7 mmol/L (ref 3.5–5.1)
Potassium: 4.1 mmol/L (ref 3.5–5.1)
Potassium: 4.5 mmol/L (ref 3.5–5.1)
Sodium: 135 mmol/L (ref 135–145)
Sodium: 136 mmol/L (ref 135–145)
Sodium: 134 mmol/L — ABNORMAL LOW (ref 135–145)
Sodium: 132 mmol/L — ABNORMAL LOW (ref 135–145)

## 2019-06-25 LAB — CBG MONITORING, ED
Glucose-Capillary: 169 mg/dL — ABNORMAL HIGH (ref 70–99)
Glucose-Capillary: 160 mg/dL — ABNORMAL HIGH (ref 70–99)
Glucose-Capillary: 159 mg/dL — ABNORMAL HIGH (ref 70–99)
Glucose-Capillary: 169 mg/dL — ABNORMAL HIGH (ref 70–99)
Glucose-Capillary: 189 mg/dL — ABNORMAL HIGH (ref 70–99)
Glucose-Capillary: 140 mg/dL — ABNORMAL HIGH (ref 70–99)
Glucose-Capillary: 134 mg/dL — ABNORMAL HIGH (ref 70–99)
Glucose-Capillary: 160 mg/dL — ABNORMAL HIGH (ref 70–99)
Glucose-Capillary: 295 mg/dL — ABNORMAL HIGH (ref 70–99)

## 2019-06-25 LAB — FERRITIN: Ferritin: 24 ng/mL (ref 11–307)

## 2019-06-25 LAB — HEMOGLOBIN A1C
Hgb A1c MFr Bld: 15.7 % — ABNORMAL HIGH (ref 4.8–5.6)
Hgb A1c MFr Bld: 15.9 % — ABNORMAL HIGH (ref 4.8–5.6)
Mean Plasma Glucose: 403.89 mg/dL
Mean Plasma Glucose: 409.63 mg/dL

## 2019-06-25 MED ORDER — INSULIN ASPART 100 UNIT/ML ~~LOC~~ SOLN: 0.0000 [IU] | Freq: Every day | SUBCUTANEOUS | Status: DC

## 2019-06-25 MED ORDER — INSULIN GLARGINE 100 UNIT/ML ~~LOC~~ SOLN: 15.0000 [IU] | Freq: Two times a day (BID) | SUBCUTANEOUS | 0 refills | Status: DC

## 2019-06-25 MED ORDER — INSULIN ASPART 100 UNIT/ML ~~LOC~~ SOLN
3.0000 [IU] | Freq: Three times a day (TID) | SUBCUTANEOUS | Status: DC
  Administered 2019-06-25 (×2): 3 [IU] via SUBCUTANEOUS

## 2019-06-25 MED ORDER — POTASSIUM CHLORIDE 20 MEQ PO PACK
20.0000 meq | PACK | Freq: Two times a day (BID) | ORAL | Status: DC
  Administered 2019-06-25: 20 meq via ORAL
  Filled 2019-06-25 (×2): qty 1

## 2019-06-25 MED ORDER — INSULIN ASPART 100 UNIT/ML ~~LOC~~ SOLN
0.0000 [IU] | Freq: Three times a day (TID) | SUBCUTANEOUS | Status: DC
  Administered 2019-06-25: 5 [IU] via SUBCUTANEOUS
  Administered 2019-06-25: 1 [IU] via SUBCUTANEOUS

## 2019-06-25 MED ORDER — "INSULIN SYRINGE 31G X 5/16"" 0.3 ML MISC": 1.0000 | Freq: Every day | 0 refills | Status: DC

## 2019-06-25 MED ORDER — INSULIN ASPART 100 UNIT/ML ~~LOC~~ SOLN: 0.0000 [IU] | Freq: Three times a day (TID) | SUBCUTANEOUS | Status: DC

## 2019-06-25 MED ORDER — INSULIN GLARGINE 100 UNIT/ML ~~LOC~~ SOLN: 15.0000 [IU] | Freq: Two times a day (BID) | SUBCUTANEOUS | 2 refills | Status: DC

## 2019-06-25 MED ORDER — INSULIN GLARGINE 100 UNIT/ML ~~LOC~~ SOLN
12.0000 [IU] | SUBCUTANEOUS | Status: DC
  Administered 2019-06-25: 12 [IU] via SUBCUTANEOUS
  Filled 2019-06-25: qty 0.12

## 2019-06-25 MED ORDER — INSULIN LISPRO 100 UNIT/ML ~~LOC~~ SOLN: 4.0000 [IU] | Freq: Three times a day (TID) | SUBCUTANEOUS | 0 refills | Status: DC

## 2019-06-25 MED FILL — LANTUS 100 UNITS/ML VIAL: 100 | 30 days supply | Qty: 10 | Fill #0

## 2019-06-25 MED FILL — ULTICARE SYR 0.3 ML 30GX5/1: 30G X 5/16" | 25 days supply | Qty: 100 | Fill #0

## 2019-06-25 MED FILL — HumaLOG 100 UNIT/ML SOLN: 100 | 28 days supply | Qty: 10 | Fill #0

## 2019-06-25 NOTE — Discharge Planning (Signed)
Collin Rengel J. Clydene Laming, RN, BSN, [place]  [facility] set up appointment with [place] on 10/28 @ 3:30.  Spoke with pt at bedside and advised to please arrive 15 min early and take a picture ID and your current medications.  Pt verbalizes understanding of keeping appointment.

## 2019-06-25 NOTE — Discharge Planning (Signed)
Pt left AMA prior to [facility] enrollment.

## 2019-06-25 NOTE — ED Notes (Signed)
Pt ambulated to and from restroom without difficulty.   

## 2019-06-25 NOTE — ED Notes (Addendum)
Dr. Darrick Meigs at bedside.

## 2019-06-25 NOTE — ED Notes (Signed)
Dr. Gwinda Passe aware of BMP, pt to leave AMA.

## 2019-06-25 NOTE — ED Notes (Signed)
Tele   Breakfast ordered  

## 2019-06-25 NOTE — Telephone Encounter (Signed)
Message received from Venita Sheffield, RN CM requesting a hospital follow up appointment for the patient.  Informed her that she has an appointment scheduled at [facility] 07/14/2019 .

## 2019-06-25 NOTE — ED Notes (Signed)
ED TO INPATIENT HANDOFF REPORT  ED Nurse Name and Phone #: Vance Peper [phone]  S Name/Age/Gender Megan Silva 23 y.o. female Room/Bed: 044C/044C  Code Status   Code Status: Full Code  Home/SNF/Other Home Patient oriented to: situation Is this baseline? Yes   Triage Complete: Triage complete  Chief Complaint N/V, Weakness  Triage Note Pt states she is a type 1 diabetic and been out of lantus for over 1 month. Pt has been traveling and been out of state. Pt reports he blood sugar readings have been high over the last week and she began to get sick over the past 2-3 days with n/v.    Allergies No Known Allergies  Level of Care/Admitting Diagnosis ED Disposition    ED Disposition Condition Comment   Admit  Hospital Area: [facility] [[id number]]  Level of Care: Telemetry Medical [104]  Covid Evaluation: Asymptomatic Screening Protocol (No Symptoms)  Diagnosis: DKA (diabetic ketoacidoses) [facility] [[id number]]  Admitting Physician: Reymundo Poll [[id number]]  Attending Physician: Reymundo Poll [[id number]]  Estimated length of stay: past midnight tomorrow  Certification:: I certify this patient will need inpatient services for at least 2 midnights  PT Class (Do Not Modify): Inpatient [101]  PT Acc Code (Do Not Modify): Private [1]       B Medical/Surgery History Past Medical History:  Diagnosis Date  . Diabetes type 1, controlled (HCC)    Past Surgical History:  Procedure Laterality Date  . CESAREAN SECTION N/A 07/06/2018   Procedure: CESAREAN SECTION;  Surgeon: Hermina Staggers, MD;  Location: [facility] BIRTHING SUITES;  Service: Obstetrics;  Laterality: N/A;  . WISDOM TOOTH EXTRACTION       A IV Location/Drains/Wounds Patient Lines/Drains/Airways Status   Active Line/Drains/Airways    Name:   Placement date:   Placement time:   Site:   Days:   Peripheral IV 06/24/19 Left Antecubital   06/24/19    1538    Antecubital   1   Incision (Closed) 07/06/18  Abdomen   07/06/18    0613     354   Incision (Closed) 07/06/18 Perineum   07/06/18    0613     354          Intake/Output Last 24 hours  Intake/Output Summary (Last 24 hours) at 06/25/2019 1338 Last data filed at 06/24/2019 2107 Gross per 24 hour  Intake 3020.5 ml  Output -  Net 3020.5 ml    Labs/Imaging Results for orders placed or performed during the hospital encounter of 06/24/19 (from the past 48 hour(s))  CBG monitoring, ED     Status: Abnormal   Collection Time: 06/24/19  2:05 PM  Result Value Ref Range   Glucose-Capillary 572 (HH) 70 - 99 mg/dL  Basic metabolic panel     Status: Abnormal   Collection Time: 06/24/19  2:11 PM  Result Value Ref Range   Sodium 133 (L) 135 - 145 mmol/L   Potassium 5.5 (H) 3.5 - 5.1 mmol/L   Chloride 96 (L) 98 - 111 mmol/L   CO2 11 (L) 22 - 32 mmol/L   Glucose, Bld 576 (HH) 70 - 99 mg/dL    Comment: CRITICAL RESULT CALLED TO, READ BACK BY AND VERIFIED WITH: E.TAYLOR RN 1516 06/24/2019 MCCORMICK K    BUN 16 6 - 20 mg/dL   Creatinine, Ser 0.86 (H) 0.44 - 1.00 mg/dL   Calcium 9.7 8.9 - 57.8 mg/dL   GFR calc non Af Amer >60 >60 mL/min   GFR calc  Af Amer >60 >60 mL/min   Anion gap 26 (H) 5 - 15    Comment: Performed at [facility], [address], [place], [place] [address]  CBC     Status: Abnormal   Collection Time: 06/24/19  2:11 PM  Result Value Ref Range   WBC 14.2 (H) 4.0 - 10.5 K/uL   RBC 5.66 (H) 3.87 - 5.11 MIL/uL   Hemoglobin 14.0 12.0 - 15.0 g/dL   HCT 60.4 54.0 - 98.1 %   MCV 80.4 80.0 - 100.0 fL   MCH 24.7 (L) 26.0 - 34.0 pg   MCHC 30.8 30.0 - 36.0 g/dL   RDW 19.1 (H) 47.8 - 29.5 %   Platelets 455 (H) 150 - 400 K/uL   nRBC 0.0 0.0 - 0.2 %    Comment: Performed at [facility], [address] [place], [place] [address]  I-Stat beta hCG blood, ED     Status: None   Collection Time: 06/24/19  2:43 PM  Result Value Ref Range   I-stat hCG, quantitative <5.0 <5 mIU/mL   Comment 3            Comment:   GEST.  AGE      CONC.  (mIU/mL)   <=1 WEEK        5 - 50     2 WEEKS       50 - 500     3 WEEKS       100 - 10,000     4 WEEKS     1,000 - 30,000        FEMALE AND NON-PREGNANT FEMALE:     LESS THAN 5 mIU/mL   Urinalysis, Routine w reflex microscopic     Status: Abnormal   Collection Time: 06/24/19  3:05 PM  Result Value Ref Range   Color, Urine STRAW (A) YELLOW   APPearance CLEAR CLEAR   Specific Gravity, Urine 1.026 1.005 - 1.030   pH 5.0 5.0 - 8.0   Glucose, UA >=500 (A) NEGATIVE mg/dL   Hgb urine dipstick MODERATE (A) NEGATIVE   Bilirubin Urine NEGATIVE NEGATIVE   Ketones, ur 80 (A) NEGATIVE mg/dL   Protein, ur NEGATIVE NEGATIVE mg/dL   Nitrite NEGATIVE NEGATIVE   Leukocytes,Ua NEGATIVE NEGATIVE   RBC / HPF 0-5 0 - 5 RBC/hpf   Bacteria, UA NONE SEEN NONE SEEN   Squamous Epithelial / LPF 0-5 0 - 5    Comment: Performed at [facility], [address], [place], [place] [address]  SARS Coronavirus 2 [facility] order, Performed in [facility] hospital lab) Nasopharyngeal Nasopharyngeal Swab     Status: None   Collection Time: 06/24/19  3:30 PM   Specimen: Nasopharyngeal Swab  Result Value Ref Range   SARS Coronavirus 2 NEGATIVE NEGATIVE    Comment: (NOTE) If result is NEGATIVE SARS-CoV-2 target nucleic acids are NOT DETECTED. The SARS-CoV-2 RNA is generally detectable in upper and lower  respiratory specimens during the acute phase of infection. The lowest  concentration of SARS-CoV-2 viral copies this assay can detect is 250  copies / mL. A negative result does not preclude SARS-CoV-2 infection  and should not be used as the sole basis for treatment or other  patient management decisions.  A negative result may occur with  improper specimen collection / handling, submission of specimen other  than nasopharyngeal swab, presence of viral mutation(s) within the  areas targeted by this assay, and inadequate number of  viral copies  (<250 copies / mL). A negative result must be  combined with clinical  observations, patient history, and epidemiological information. If result is POSITIVE SARS-CoV-2 target nucleic acids are DETECTED. The SARS-CoV-2 RNA is generally detectable in upper and lower  respiratory specimens dur ing the acute phase of infection.  Positive  results are indicative of active infection with SARS-CoV-2.  Clinical  correlation with patient history and other diagnostic information is  necessary to determine patient infection status.  Positive results do  not rule out bacterial infection or co-infection with other viruses. If result is PRESUMPTIVE POSTIVE SARS-CoV-2 nucleic acids MAY BE PRESENT.   A presumptive positive result was obtained on the submitted specimen  and confirmed on repeat testing.  While 2019 novel coronavirus  (SARS-CoV-2) nucleic acids may be present in the submitted sample  additional confirmatory testing may be necessary for epidemiological  and / or clinical management purposes  to differentiate between  SARS-CoV-2 and other Sarbecovirus currently known to infect humans.  If clinically indicated additional testing with an alternate test  methodology [phone]([id number]) is advised. The SARS-CoV-2 RNA is generally  detectable in upper and lower respiratory sp ecimens during the acute  phase of infection. The expected result is Negative. Fact Sheet for Patients:  BoilerBrush.com.cyhttps://www.fda.gov/media/136312/download Fact Sheet for Healthcare Providers: https://pope.com/https://www.fda.gov/media/136313/download This test is not yet approved or cleared by the [place] FDA and has been authorized for detection and/or diagnosis of SARS-CoV-2 by FDA under an Emergency Use Authorization (EUA).  This EUA will remain in effect (meaning this test can be used) for the duration of the COVID-19 declaration under Section 564(b)(1) of the Act, 21 U.S.C. section 360bbb-3(b)(1), unless the authorization is terminated or revoked sooner. Performed at [facility], [address] [place], [place] [address]   CBG monitoring, ED     Status: Abnormal   Collection Time: 06/24/19  5:00 PM  Result Value Ref Range   Glucose-Capillary 541 (HH) 70 - 99 mg/dL  CBG monitoring, ED     Status: Abnormal   Collection Time: 06/24/19  6:39 PM  Result Value Ref Range   Glucose-Capillary 282 (H) 70 - 99 mg/dL  HIV Antibody (routine testing w rflx)     Status: None   Collection Time: 06/24/19  7:38 PM  Result Value Ref Range   HIV Screen 4th Generation wRfx NON REACTIVE NON REACTIVE    Comment: Performed at [facility], [address] [place], [place] [address]  Basic metabolic panel     Status: Abnormal   Collection Time: 06/24/19  7:38 PM  Result Value Ref Range   Sodium 135 135 - 145 mmol/L   Potassium 4.9 3.5 - 5.1 mmol/L   Chloride 106 98 - 111 mmol/L   CO2 13 (L) 22 - 32 mmol/L   Glucose, Bld 172 (H) 70 - 99 mg/dL   BUN 13 6 - 20 mg/dL   Creatinine, Ser 8.460.92 0.44 - 1.00 mg/dL   Calcium 8.5 (L) 8.9 - 10.3 mg/dL   GFR calc non Af Amer >60 >60 mL/min   GFR calc Af Amer >60 >60 mL/min   Anion gap 16 (H) 5 - 15    Comment: Performed at [facility], [address] [place], [place] [address]  Hemoglobin A1c     Status: Abnormal   Collection Time: 06/24/19  7:38 PM  Result Value Ref Range   Hgb A1c MFr Bld 15.7 (H) 4.8 - 5.6 %  Comment: (NOTE) Pre diabetes:          5.7%-6.4% Diabetes:              >6.4% Glycemic control for   <7.0% adults with diabetes    Mean Plasma Glucose 403.89 mg/dL    Comment: Performed at [facility] [address], [place], [place] [address]  Magnesium     Status: None   Collection Time: 06/24/19  7:38 PM  Result Value Ref Range   Magnesium 2.0 1.7 - 2.4 mg/dL    Comment: Performed at [facility], [place] [address], [place], [place] [address]  Phosphorus     Status: None   Collection Time: 06/24/19  7:38 PM  Result Value Ref Range   Phosphorus 3.0 2.5 - 4.6 mg/dL    Comment: Performed at  [facility], [place] [address], [place], [place] [address]  Beta-hydroxybutyric acid     Status: Abnormal   Collection Time: 06/24/19  7:38 PM  Result Value Ref Range   Beta-Hydroxybutyric Acid 4.70 (H) 0.05 - 0.27 mmol/L    Comment: RESULTS CONFIRMED BY MANUAL DILUTION Performed at [facility] [address] [place], [place] [address]   CBG monitoring, ED     Status: Abnormal   Collection Time: 06/24/19  7:48 PM  Result Value Ref Range   Glucose-Capillary 195 (H) 70 - 99 mg/dL  CBG monitoring, ED     Status: Abnormal   Collection Time: 06/24/19  8:41 PM  Result Value Ref Range   Glucose-Capillary 150 (H) 70 - 99 mg/dL  POCT I-Stat EG7     Status: Abnormal   Collection Time: 06/24/19  8:55 PM  Result Value Ref Range   pH, Ven 7.277 7.250 - 7.430   pCO2, Ven 25.6 (L) 44.0 - 60.0 mmHg   pO2, Ven 48.0 (H) 32.0 - 45.0 mmHg   Bicarbonate 12.0 (L) 20.0 - 28.0 mmol/L   TCO2 13 (L) 22 - 32 mmol/L   O2 Saturation 79.0 %   Acid-base deficit 13.0 (H) 0.0 - 2.0 mmol/L   Sodium 138 135 - 145 mmol/L   Potassium 4.2 3.5 - 5.1 mmol/L   Calcium, Ion 1.16 1.15 - 1.40 mmol/L   HCT 36.0 36.0 - 46.0 %   Hemoglobin 12.2 12.0 - 15.0 g/dL   Patient temperature HIDE    Sample type VENOUS   CBG monitoring, ED     Status: Abnormal   Collection Time: 06/24/19  9:42 PM  Result Value Ref Range   Glucose-Capillary 122 (H) 70 - 99 mg/dL  Basic metabolic panel     Status: Abnormal   Collection Time: 06/24/19 10:11 PM  Result Value Ref Range   Sodium 135 135 - 145 mmol/L   Potassium 4.5 3.5 - 5.1 mmol/L   Chloride 111 98 - 111 mmol/L   CO2 14 (L) 22 - 32 mmol/L   Glucose, Bld 110 (H) 70 - 99 mg/dL   BUN 11 6 - 20 mg/dL   Creatinine, Ser 0.71 0.44 - 1.00 mg/dL   Calcium 7.9 (L) 8.9 - 10.3 mg/dL   GFR calc non Af Amer >60 >60 mL/min   GFR calc Af Amer >60 >60 mL/min   Anion gap 10 5 - 15    Comment: Performed at [facility], [place] [address] [place], [place] [address]  CBG  monitoring, ED     Status: Abnormal   Collection Time: 06/24/19 10:51 PM  Result  Value Ref Range   Glucose-Capillary 153 (H) 70 - 99 mg/dL  CBG monitoring, ED     Status: Abnormal   Collection Time: 06/24/19 11:55 PM  Result Value Ref Range   Glucose-Capillary 189 (H) 70 - 99 mg/dL  CBG monitoring, ED     Status: Abnormal   Collection Time: 06/25/19  1:02 AM  Result Value Ref Range   Glucose-Capillary 169 (H) 70 - 99 mg/dL   Comment 1 Document in Chart   CBG monitoring, ED     Status: Abnormal   Collection Time: 06/25/19  2:04 AM  Result Value Ref Range   Glucose-Capillary 160 (H) 70 - 99 mg/dL   Comment 1 Document in Chart   Basic metabolic panel     Status: Abnormal   Collection Time: 06/25/19  2:11 AM  Result Value Ref Range   Sodium 135 135 - 145 mmol/L   Potassium 3.8 3.5 - 5.1 mmol/L   Chloride 110 98 - 111 mmol/L   CO2 18 (L) 22 - 32 mmol/L   Glucose, Bld 164 (H) 70 - 99 mg/dL   BUN 8 6 - 20 mg/dL   Creatinine, Ser 8.46 0.44 - 1.00 mg/dL   Calcium 8.0 (L) 8.9 - 10.3 mg/dL   GFR calc non Af Amer >60 >60 mL/min   GFR calc Af Amer >60 >60 mL/min   Anion gap 7 5 - 15    Comment: Performed at [facility], [address], [place], [place] [address]  CBG monitoring, ED     Status: Abnormal   Collection Time: 06/25/19  3:01 AM  Result Value Ref Range   Glucose-Capillary 159 (H) 70 - 99 mg/dL   Comment 1 Document in Chart   CBG monitoring, ED     Status: Abnormal   Collection Time: 06/25/19  4:11 AM  Result Value Ref Range   Glucose-Capillary 169 (H) 70 - 99 mg/dL  Basic metabolic panel     Status: Abnormal   Collection Time: 06/25/19  4:57 AM  Result Value Ref Range   Sodium 136 135 - 145 mmol/L   Potassium 3.7 3.5 - 5.1 mmol/L   Chloride 108 98 - 111 mmol/L   CO2 17 (L) 22 - 32 mmol/L   Glucose, Bld 214 (H) 70 - 99 mg/dL   BUN 10 6 - 20 mg/dL   Creatinine, Ser 2.84 0.44 - 1.00 mg/dL   Calcium 8.2 (L) 8.9 - 10.3 mg/dL   GFR calc non Af Amer >60 >60 mL/min    GFR calc Af Amer >60 >60 mL/min   Anion gap 11 5 - 15    Comment: Performed at [facility], [address] [place], [place] [address]  CBC with Differential/Platelet     Status: Abnormal   Collection Time: 06/25/19  4:57 AM  Result Value Ref Range   WBC 10.5 4.0 - 10.5 K/uL   RBC 4.42 3.87 - 5.11 MIL/uL   Hemoglobin 10.7 (L) 12.0 - 15.0 g/dL   HCT 01.0 (L) 27.2 - 53.6 %   MCV 80.3 80.0 - 100.0 fL   MCH 24.2 (L) 26.0 - 34.0 pg   MCHC 30.1 30.0 - 36.0 g/dL   RDW 64.4 (H) 03.4 - 74.2 %   Platelets 372 150 - 400 K/uL   nRBC 0.0 0.0 - 0.2 %   Neutrophils Relative % 50 %   Neutro Abs 5.3 1.7 - 7.7 K/uL   Lymphocytes Relative 39 %   Lymphs  Abs 4.1 (H) 0.7 - 4.0 K/uL   Monocytes Relative 9 %   Monocytes Absolute 1.0 0.1 - 1.0 K/uL   Eosinophils Relative 1 %   Eosinophils Absolute 0.1 0.0 - 0.5 K/uL   Basophils Relative 1 %   Basophils Absolute 0.1 0.0 - 0.1 K/uL   Immature Granulocytes 0 %   Abs Immature Granulocytes 0.03 0.00 - 0.07 K/uL    Comment: Performed at [facility], [address], [place], [place] [address]  CBG monitoring, ED     Status: Abnormal   Collection Time: 06/25/19  5:17 AM  Result Value Ref Range   Glucose-Capillary 189 (H) 70 - 99 mg/dL  CBG monitoring, ED     Status: Abnormal   Collection Time: 06/25/19  6:22 AM  Result Value Ref Range   Glucose-Capillary 140 (H) 70 - 99 mg/dL  Hemoglobin N8G     Status: Abnormal   Collection Time: 06/25/19  7:09 AM  Result Value Ref Range   Hgb A1c MFr Bld 15.9 (H) 4.8 - 5.6 %    Comment: (NOTE) Pre diabetes:          5.7%-6.4% Diabetes:              >6.4% Glycemic control for   <7.0% adults with diabetes    Mean Plasma Glucose 409.63 mg/dL    Comment: Performed at [facility], [address] [place], [place] [address]  CBG monitoring, ED     Status: Abnormal   Collection Time: 06/25/19  8:12 AM  Result Value Ref Range   Glucose-Capillary 134 (H) 70 - 99 mg/dL  Basic metabolic panel      Status: Abnormal   Collection Time: 06/25/19  9:00 AM  Result Value Ref Range   Sodium 134 (L) 135 - 145 mmol/L   Potassium 4.1 3.5 - 5.1 mmol/L   Chloride 107 98 - 111 mmol/L   CO2 17 (L) 22 - 32 mmol/L   Glucose, Bld 203 (H) 70 - 99 mg/dL   BUN 9 6 - 20 mg/dL   Creatinine, Ser 3.08 0.44 - 1.00 mg/dL   Calcium 8.5 (L) 8.9 - 10.3 mg/dL   GFR calc non Af Amer >60 >60 mL/min   GFR calc Af Amer >60 >60 mL/min   Anion gap 10 5 - 15    Comment: Performed at [facility], [address], [place], [place] [address]  Ferritin     Status: None   Collection Time: 06/25/19  9:00 AM  Result Value Ref Range   Ferritin 24 11 - 307 ng/mL    Comment: Performed at [facility], [address] [place], [place] [address]  Iron and TIBC     Status: None   Collection Time: 06/25/19  9:00 AM  Result Value Ref Range   Iron 45 28 - 170 ug/dL   TIBC 528 413 - 244 ug/dL   Saturation Ratios 16 10.4 - 31.8 %   UIBC 239 ug/dL    Comment: Performed at [facility], [address] [place], [place] [address]  CBG monitoring, ED     Status: Abnormal   Collection Time: 06/25/19  9:50 AM  Result Value Ref Range   Glucose-Capillary 160 (H) 70 - 99 mg/dL  CBG monitoring, ED     Status: Abnormal   Collection Time: 06/25/19  1:08 PM  Result Value Ref Range   Glucose-Capillary 295 (H) 70 - 99 mg/dL  No results found.  Pending Labs Unresulted Labs (From admission, onward)    Start     Ordered   06/25/19 1300  Basic metabolic panel  Once-Timed,   STAT     06/25/19 1132   06/25/19 0500  CBC with Differential/Platelet  Tomorrow morning,   R     06/24/19 1809   06/25/19 0500  Basic metabolic panel  Daily,   R     06/25/19 0306   06/24/19 1806  HIV4GL Save Tube  (HIV Antibody (Routine testing w reflex) panel)  Once,   STAT     06/24/19 1809          Vitals/Pain Today's Vitals   06/25/19 0700 06/25/19 0900 06/25/19 1000 06/25/19 1100  BP: 104/69 113/70 101/60 107/67  Pulse: 89 88 85  92  Resp: 12 15 (!) 22   Temp:      TempSrc:      SpO2: 99% 100% 99% 99%  PainSc:        Isolation Precautions No active isolations  Medications Medications  sodium chloride 0.9 % bolus 1,000 mL (0 mLs Intravenous Stopped 06/24/19 2105)    And  sodium chloride 0.9 % bolus 1,000 mL (0 mLs Intravenous Stopped 06/24/19 2105)    And  sodium chloride 0.9 % bolus 1,000 mL (0 mLs Intravenous Stopped 06/24/19 2107)    And  0.9 %  sodium chloride infusion ( Intravenous Not Given 06/25/19 0310)  enoxaparin (LOVENOX) injection 40 mg (40 mg Subcutaneous Not Given 06/25/19 0312)  ondansetron (ZOFRAN) tablet 4 mg (has no administration in time range)    Or  ondansetron (ZOFRAN) injection 4 mg (has no administration in time range)  0.9 %  sodium chloride infusion ( Intravenous Not Given 06/24/19 2109)  0.9 %  sodium chloride infusion ( Intravenous Not Given 06/24/19 2108)  insulin aspart (novoLOG) injection 0-9 Units (5 Units Subcutaneous Given 06/25/19 1314)  insulin aspart (novoLOG) injection 0-5 Units (has no administration in time range)  insulin aspart (novoLOG) injection 3 Units (3 Units Subcutaneous Given 06/25/19 1314)  potassium chloride (KLOR-CON) packet 20 mEq (20 mEq Oral Given 06/25/19 1024)  ondansetron (ZOFRAN) injection 4 mg (4 mg Intravenous Given 06/24/19 1537)    Mobility walks Low fall risk   Focused Assessments Cardiac Assessment Handoff:    No results found for: CKTOTAL, CKMB, CKMBINDEX, TROPONINI No results found for: DDIMER Does the Patient currently have chest pain? No      R Recommendations: See Admitting Provider Note  Report given to:   Additional Notes: Pt is alert and oriented, pt needs medication assistance prior to discharge.

## 2019-06-25 NOTE — Progress Notes (Signed)
NAME:  Megan Silva, MRN:  [id number], DOB:  Feb 24, 1996, LOS: 1 ADMISSION DATE:  06/24/2019,  Primary: Patient, No Pcp Per  CHIEF COMPLAINT:  DKA  Medical Service: Internal Medicine Teaching Service         Attending Physician: Dr. Velna Ochs, MD    First Contact: Dr. Darrick Meigs Pager: [phone]  Second Contact: Dr. Sharon Seller Pager: [phone]       After Hours (After 5p/  First Contact Pager: [phone]  weekends / holidays): Second Contact Pager: [phone]    Brief History  23 yo female with PMH significant for DM type I presented to ED 10/6 for 1d hx of n/v/abd pn and subsequently found to be in DKA. In the setting of 84mo hx of having run out lantus.  Subjective  Resting in bed comfortably.    Significant Hospital Events   Admitted 10/6  Objective   Blood pressure 101/60, pulse 85, temperature 97.7 F (36.5 C), temperature source Oral, resp. rate (!) 22, SpO2 99 %, unknown if currently breastfeeding.     Intake/Output Summary (Last 24 hours) at 06/25/2019 1051 Last data filed at 06/24/2019 2107 Gross per 24 hour  Intake 3020.5 ml  Output -  Net 3020.5 ml   There were no vitals filed for this visit.  Examination: GENERAL: in no acute distress HEENT: head atraumatic. No conjunctival injection. Nares patent.  CARDIAC: heart RRR. No peripheral edema.  PULMONARY: acyanotic. Lung sounds clear to auscultation. ABDOMEN: soft. Nontender to palpation.  Nondistended. No organomegaly appreciated.  NEURO: CN II-XII grossly intact. SKIN: no rash or lesions on limited exam  PSYCH: A/Ox3. Normal affect  Consults:  n/a  Significant Diagnostic Tests:  Admission labs. CBG 572, UA + ketones, +hgb, +glucose, venous HCO3 11, AG 26, K 5.5  Labs   CBC: Recent Labs  Lab 06/24/19 1411 06/24/19 2055 06/25/19 0457  WBC 14.2*  --  10.5  NEUTROABS  --   --  5.3  HGB 14.0 12.2 10.7*  HCT 45.5 36.0 35.5*  MCV 80.4  --  80.3  PLT 455*  --  017    Basic Metabolic Panel: Recent  Labs  Lab 06/24/19 1411 06/24/19 1938 06/24/19 2055 06/24/19 2211 06/25/19 0211 06/25/19 0457  NA 133* 135 138 135 135 136  K 5.5* 4.9 4.2 4.5 3.8 3.7  CL 96* 106  --  111 110 108  CO2 11* 13*  --  14* 18* 17*  GLUCOSE 576* 172*  --  110* 164* 214*  BUN 16 13  --  11 8 10   CREATININE 1.23* 0.92  --  0.71 0.74 0.70  CALCIUM 9.7 8.5*  --  7.9* 8.0* 8.2*  MG  --  2.0  --   --   --   --   PHOS  --  3.0  --   --   --   --    GFR: CrCl cannot be calculated (Unknown ideal weight.). Recent Labs  Lab 06/24/19 1411 06/25/19 0457  WBC 14.2* 10.5    Liver Function Tests: No results for input(s): AST, ALT, ALKPHOS, BILITOT, PROT, ALBUMIN in the last 168 hours.   Summary  23 yo female with PMH significant for DM type I presented to ED 10/6 for 1d hx of n/v/abd pn and subsequently found to be in DKA. In the setting of 94mo hx of having run out lantus.  Assessment & Plan:  Active Problems:   DKA (diabetic ketoacidoses) ([place])  DKA. Pt with history of DM  type I.  CBG 572, UA + ketones, +hgb, +glucose, venous HCO3 11, AG 26. No significant lab finds or sx for infectious etiology. Likely attributable to not taking lantus for past month. AG closed x2 this morning. K wnl. Lantus started at 0400 this am Plan IV insulin d/c D/c D5 Lantus 12U + sensitive SSI + HS coverage Continue IVF Carb modified diet   Best practice:  CODE STATUS: FULL Diet: carb modified Pain management:tylenol Glucose control: see above IV fluids DVT for prophylaxis: lovenox Dispo: per RN, patient seen by SW last pm and was noted to have transportation issues, no insurance and some financial barriers.  Possible candidate for d/c later today pending labs remaining stable. Will need close follow up in our clinic and referral to endocrinology.   Elige Radon, MD INTERNAL MEDICINE RESIDENT PGY-1 PAGER #: [phone] 06/25/19 10:51 AM

## 2019-06-25 NOTE — ED Notes (Signed)
Rounding at bedside.  

## 2019-06-25 NOTE — ED Notes (Signed)
Pt eating [place] sandwich. Per MD request to eat before Lantus is given.

## 2019-06-25 NOTE — Telephone Encounter (Signed)
Pt has no way to pay for meds, she is out of humalog and cannot get the lantus because it is so expensive. Could we oplease send 1 month supply of both and needles to [facility] and state only 1 month.

## 2019-06-25 NOTE — ED Notes (Signed)
CBG 160 

## 2019-06-25 NOTE — ED Notes (Signed)
Lunch tray ordered 

## 2019-06-25 NOTE — Discharge Planning (Signed)
[facility] notes consult for medication assistance and PCP follow-up.

## 2019-06-25 NOTE — ED Notes (Signed)
This RN left voicemail with social work about medication assistance and about the diabetes coordinator.

## 2019-06-25 NOTE — ED Notes (Signed)
CBG 295 

## 2019-06-25 NOTE — Progress Notes (Signed)
Received page this afternoon regarding patient wishing to be discharged. Pt evaluated at bedside. Noted that she wanted her one year old child to be able to stay with her. Noted that she did have other individuals currently caring for the child. Explained current COVID hosp policies regarding child visitors. Patient refused to make eye contact throughout conversation.  I discussed with her that she should wait until 1300 BMP results were back which would have been within 20-30 min. Patient refused. Risks of forgoing further medical treatment,  including but not limited to worsening DKA status, electrolyte derangement with subsequent dysrhythmia or potentially death, were discussed. Patient implied understanding.  Also discussed financial barriers and role of TOC pharmacy in helping. SW previously discussed this with her and paperwork for [facility] contact info present in room.  Despite this, patient refused to stay. Lantus sent to [place] at patient's request. Provided contact information for [facility] clinic and encouraged her to set up appt within 5d. Message also sent to front desk clinic schedulers to contact her regarding setting this appt up.

## 2019-06-25 NOTE — Telephone Encounter (Signed)
Pt seen in the Er yesterday and D/C today states she was seen by Dr. Lucinda Dell and has questions about all of her Prescriptions that we called in.  Pt is sch with the [facility] on 07/14/2019.  Please call patient back.

## 2019-06-25 NOTE — ED Notes (Signed)
CBG 134 

## 2019-06-25 NOTE — ED Notes (Signed)
Paging Dr. Darrick Meigs

## 2019-06-25 NOTE — Progress Notes (Addendum)
Inpatient Diabetes Program Recommendations  AACE/ADA: New Consensus Statement on Inpatient Glycemic Control (2015)  Target Ranges:  Prepandial:   less than 140 mg/dL      Peak postprandial:   less than 180 mg/dL (1-2 hours)      Critically ill patients:  140 - 180 mg/dL    Review of Glycemic Control  Diabetes history: DM 1 Outpatient Diabetes medications: Lantus 15 units bid, Humalog 4 units tid with meals Current orders for Inpatient glycemic control:  Novolog 0-9 units tid + Novolog 0-5 Novolog 3 units tid  A1c 15.7% this admission  Inpatient Diabetes Program Recommendations:    Only Novolog correction and meal coverage ordered. Please order Lantus 12 units bid for now.  Pt without insurance. Did have an appointment [facility] but will have to reschedule. Note COVID test pending. Ran out of insulin for 1 month.  Will speak with pt this admission to inform her of back up insulin at [facility] if she does not already know. Will discuss her A1c level at that time. She will follow up at the clinic. She should be able to get her insulin from [facility] pharmacy.  Thanks,  Tama Headings RN, MSN, BC-ADM Inpatient Diabetes Coordinator Team Pager [phone] (8a-5p)

## 2019-06-25 NOTE — Discharge Summary (Signed)
    Megan Silva left AMA. Medications sent to pharmacy.  Name: Megan Silva MRN: [id number] DOB: March 09, 1996 23 y.o. PCP: Megan Silva, No Pcp Per  Date of Admission: 06/24/2019  1:59 PM Date of Discharge: 06/25/2019 Attending Physician: No att. providers found  Discharge Diagnosis: 1. DKA  Discharge Medications: Allergies as of 06/25/2019   No Known Allergies     Medication List    TAKE these medications   insulin glargine 100 UNIT/ML injection Commonly known as: LANTUS Inject 0.15 mLs (15 Units total) into the skin 2 (two) times daily.   insulin lispro 100 UNIT/ML injection Commonly known as: HUMALOG Inject 0.04 mLs (4 Units total) into the skin 3 (three) times daily before meals.   INSULIN SYRINGE .3CC/31GX5/16" 31G X 5/16" 0.3 ML Misc 1 Syringe by Does not apply route 5 (five) times daily.   PEN NEEDLES 31GX5/16" 31G X 8 MM Misc 1 Dose by Does not apply route 4 (four) times daily.       Disposition and follow-up:   [name] was discharged from [facility] in Stable condition.  At the hospital follow up visit please address:  1.  DKA. Will need follow up in [facility] clinic at discharge. Message sent to front desk. Will need to discuss financial barriers in further detail as Megan Silva is uninsured. Pt also has transportation barrier. SW saw in ER. Offered TOC for lantus. Megan Silva left AMA prior to medication delivery.  2.  Labs / imaging needed at time of follow-up: none 3.  Pending labs/ test needing follow-up: none  Follow-up Appointments: Follow-up Information    [facility]. Go on 07/14/2019.   Why: @ 3:30 Contact information: Megan Silva [id number] [place] Hospital Course: 23 year old female who presented for her right day history of nausea and vomiting.  Megan Silva has known history of type 1 diabetes diagnosed in 2013.  Megan Silva also endorses not being able  to take her Lantus for the past month due to having run out.  Megan Silva recently moved from [place] and has not established with an endocrinologist here in [place].  Over the past month, she does note elevated blood sugars as well as increased urination volume.  She denies any recent fever, chills, diarrhea, constipation, dysuria, shortness of breath. Upon presentation to the ED, Megan Silva found to initial CBG 573.  Significant for ketones.  Bicarb appropriately little in the setting of DKA. Megan Silva was given IV fluid boluses-insulin which was insulin converted to subcu insulin.  Vitals remained stable throughout her hospitalization.  Anion gap closed. SW consulted regarding financial barriers--uninsured, lack of transportation. Transitions of care pharmacy offered however Megan Silva elected to leave AMA prior to medication arrival. Pt wished to have medications sent to [facility] pharmacy despite being informed that she would likely need to pay out of pocket costs. Repeat BMPs performed. Pt elected to leave AMA despite repeat BMP worsening. See note in chart for details.   Discharge Vitals:   BP (!) 131/96   Pulse 85   Temp 97.7 F (36.5 C) (Oral)   Resp (!) 22   SpO2 100%   Discharge Instructions: Hosp follow up in [facility] within 5d   Signed: Mitzi Hansen, MD 06/25/2019, 4:11 PM

## 2019-06-30 ENCOUNTER — Ambulatory Visit: Payer: Self-pay

## 2019-06-30 ENCOUNTER — Other Ambulatory Visit: Payer: Self-pay

## 2019-06-30 ENCOUNTER — Ambulatory Visit (INDEPENDENT_AMBULATORY_CARE_PROVIDER_SITE_OTHER): Payer: Self-pay | Admitting: Internal Medicine

## 2019-06-30 DIAGNOSIS — O24913 Unspecified diabetes mellitus in pregnancy, third trimester: Secondary | ICD-10-CM

## 2019-06-30 DIAGNOSIS — E109 Type 1 diabetes mellitus without complications: Secondary | ICD-10-CM | POA: Insufficient documentation

## 2019-06-30 DIAGNOSIS — H538 Other visual disturbances: Secondary | ICD-10-CM

## 2019-06-30 DIAGNOSIS — E1069 Type 1 diabetes mellitus with other specified complication: Secondary | ICD-10-CM

## 2019-06-30 MED ORDER — INSULIN GLARGINE 100 UNIT/ML ~~LOC~~ SOLN: 25.0000 [IU] | Freq: Every day | SUBCUTANEOUS | 0 refills | Status: DC

## 2019-06-30 MED ORDER — INSULIN LISPRO 100 UNIT/ML ~~LOC~~ SOLN: 5.0000 [IU] | Freq: Three times a day (TID) | SUBCUTANEOUS | 0 refills | Status: DC

## 2019-06-30 NOTE — Assessment & Plan Note (Signed)
Patient reports being diagnosed with diabetes in 2015.  She was on a Omni pop at one time but moved to [place].  She is in the process of applying for insurance in [place] and at that time she would like to pursue being on a insulin pump again. However, she feels she is managing well with injectable insulin at this time.  She presents after recent admission for DKA.  Cost can be a barrier to insulin at times.  Recently she was doing insulin in the clinic and she has enough for at least a month.   Reports blood glucose is never very high, less than 120.  She has had a few episodes of hypoglycemia in the morning and during the night.  Reports taking Lantus 15 units twice daily.  Discussed taking it once a day because this is a long-acting medication if more convenient.  Also discussed going down to 25 units.   Patient has also reported some recent vision changes with restarting her insulin regimen.  Likely side effect from controlled blood glucose.  Patient is in need of yearly eye exam and recommend referral once her insurance has been approved.  Plan: -Take Lantus 25 Units at bedtime - Humalog 5 units 3 times a day with meals -Ophthalmology referral in the future

## 2019-06-30 NOTE — Patient Instructions (Addendum)
Thank you for trusting me with your care. To recap, today we discussed the following:  Type 1 Diabetes  -We discussed your blood sugars are well controlled on her regimen.  You have had a few episodes of low blood sugars. -Take Lantus 25 Units at bedtime - Humalog 5 units 3 times a day with meals   Vision changes -Your vision should improve with eating used to the lower blood glucose -Once your insurance is approved, please remind your PCP to put a referral into ophthalmology\  My best,  Tamsen Snider, MD

## 2019-06-30 NOTE — Progress Notes (Signed)
   CC: Type 1 diabetes  HPI:Ms.Makana L Stong is a 23 y.o. with past medical history listed below.  Please see problem based charting for details of mentation, assessment, and plan.  Past Medical History:  Diagnosis Date  . Diabetes type 1, controlled ([place])    Review of Systems:  Review of Systems  Constitutional: Negative for chills and fever.  Eyes: Positive for blurred vision. Negative for double vision.  Neurological: Negative for dizziness and tingling.     Vitals:   06/30/19 1534  BP: 122/72  Pulse: 77  Temp: 98.4 F (36.9 C)  TempSrc: Oral  SpO2: 100%  Weight: 146 lb 14.4 oz (66.6 kg)  Height: 5\' 7"  (1.702 m)   Physical Exam: Physical Exam Constitutional:      Appearance: Normal appearance.  Cardiovascular:     Rate and Rhythm: Normal rate and regular rhythm.     Heart sounds: No murmur. No friction rub. No gallop.   Pulmonary:     Breath sounds: Normal breath sounds. No wheezing, rhonchi or rales.  Abdominal:     General: Bowel sounds are normal.     Palpations: Abdomen is soft.  Neurological:     Mental Status: She is alert.  Psychiatric:        Mood and Affect: Mood normal.        Behavior: Behavior normal.      Assessment & Plan:   See Encounters Tab for problem based charting.  Patient seen with Dr. Evette Doffing

## 2019-07-01 ENCOUNTER — Ambulatory Visit: Payer: Self-pay

## 2019-07-01 NOTE — Progress Notes (Signed)
Internal Medicine Clinic Attending  I saw and evaluated the patient.  I personally confirmed the key portions of the history and exam documented by Dr. Steen and I reviewed pertinent patient test results.  The assessment, diagnosis, and plan were formulated together and I agree with the documentation in the resident's note.     

## 2019-07-14 ENCOUNTER — Inpatient Hospital Stay (INDEPENDENT_AMBULATORY_CARE_PROVIDER_SITE_OTHER): Payer: Self-pay | Admitting: Primary Care

## 2019-07-16 ENCOUNTER — Ambulatory Visit: Payer: Self-pay

## 2019-07-23 ENCOUNTER — Inpatient Hospital Stay (INDEPENDENT_AMBULATORY_CARE_PROVIDER_SITE_OTHER): Payer: Self-pay | Admitting: Primary Care

## 2019-07-30 ENCOUNTER — Encounter: Payer: Self-pay | Admitting: Internal Medicine

## 2019-09-01 ENCOUNTER — Other Ambulatory Visit: Payer: Self-pay | Admitting: Internal Medicine

## 2019-09-01 DIAGNOSIS — O24913 Unspecified diabetes mellitus in pregnancy, third trimester: Secondary | ICD-10-CM

## 2019-09-02 ENCOUNTER — Other Ambulatory Visit: Payer: Self-pay | Admitting: Internal Medicine

## 2019-09-02 ENCOUNTER — Other Ambulatory Visit: Payer: Self-pay | Admitting: *Deleted

## 2019-09-02 DIAGNOSIS — O24913 Unspecified diabetes mellitus in pregnancy, third trimester: Secondary | ICD-10-CM

## 2019-09-02 MED ORDER — INSULIN LISPRO 100 UNIT/ML ~~LOC~~ SOLN: 4.0000 [IU] | Freq: Three times a day (TID) | SUBCUTANEOUS | 5 refills | Status: DC

## 2019-09-02 MED ORDER — "INSULIN SYRINGE 31G X 5/16"" 0.3 ML MISC": 1.0000 | Freq: Every day | 5 refills | Status: DC

## 2019-09-02 MED ORDER — INSULIN GLARGINE 100 UNIT/ML ~~LOC~~ SOLN: 15.0000 [IU] | Freq: Two times a day (BID) | SUBCUTANEOUS | 5 refills | Status: DC

## 2019-09-02 NOTE — Telephone Encounter (Signed)
Please re-send rxs with "IM Program". Thanks

## 2019-09-04 ENCOUNTER — Other Ambulatory Visit: Payer: Self-pay | Admitting: Internal Medicine

## 2019-09-04 ENCOUNTER — Other Ambulatory Visit: Payer: Self-pay | Admitting: *Deleted

## 2019-09-04 DIAGNOSIS — O24913 Unspecified diabetes mellitus in pregnancy, third trimester: Secondary | ICD-10-CM

## 2019-09-04 MED ORDER — "PEN NEEDLES 5/16"" 31G X 8 MM MISC": 1.0000 | Freq: Four times a day (QID) | 6 refills | Status: DC

## 2019-09-04 MED ORDER — INSULIN ASPART 100 UNIT/ML FLEXPEN: 5.0000 [IU] | PEN_INJECTOR | Freq: Three times a day (TID) | SUBCUTANEOUS | 3 refills | Status: DC

## 2019-09-04 MED ORDER — INSULIN LISPRO (1 UNIT DIAL) 100 UNIT/ML (KWIKPEN): 5.0000 [IU] | PEN_INJECTOR | Freq: Three times a day (TID) | SUBCUTANEOUS | 11 refills | Status: DC

## 2019-09-04 MED ORDER — INSULIN DETEMIR 100 UNIT/ML FLEXPEN: 25.0000 [IU] | PEN_INJECTOR | Freq: Every day | SUBCUTANEOUS | 5 refills | Status: DC

## 2019-09-04 MED ORDER — INSULIN GLARGINE 100 UNIT/ML SOLOSTAR PEN: 25.0000 [IU] | PEN_INJECTOR | Freq: Every day | SUBCUTANEOUS | 11 refills | Status: DC

## 2019-09-04 MED FILL — HUMALOG 100 UNITS/ML KWIKPE: 100 | 20 days supply | Qty: 3 | Fill #0

## 2019-09-04 MED FILL — UNIFINE PENTIPS 8MM 31G: 31G X 8 MM | 25 days supply | Qty: 100 | Fill #0

## 2019-09-04 MED FILL — LANTUS SOLOSTAR 100 UNITS/M: 100 | 24 days supply | Qty: 6 | Fill #0

## 2019-09-04 NOTE — Telephone Encounter (Signed)
Could you please change both insulins to pens not vials, thanks

## 2019-09-04 NOTE — Telephone Encounter (Signed)
Sent corrected request to md

## 2019-09-04 NOTE — Progress Notes (Signed)
Spoke with pharmacy and Humalog KwikPen and Lantus Solostar pen are covered under IM program.  Patient preference is for insulin pens.  We will send prescription for supplies.  Plan: *Lantus 25 units at bedtime *NovoLog 5 units 3 times daily with meals

## 2019-09-04 NOTE — Progress Notes (Signed)
Spoke with patient on phone and patient is requesting insulin to be sent to [facility] outpatient pharmacy as she is uninsured.  Patient states that she has used insulin pen before's and is okay with these being prescribed again.  Given that Lantus and Humalog are not available on the $4 list, will send prescription for Levemir and NovoLog pens.  Plan: * Levemir 25 Units at bedtime * Novolog 5 Units 3 times daily with meals

## 2019-09-04 NOTE — Telephone Encounter (Signed)
Needs refill on ;pt contact [phone]   insulin glargine (LANTUS) 100 UNIT/ML injection     insulin lispro (HUMALOG) 100 UNIT/ML injection    Insulin Pen Needle (PEN NEEDLES 31GX5/16") 31G X 8 MM MISC    Insulin Syringe-Needle U-100 (INSULIN SYRINGE .3CC/31GX5/16") 31G X 5/16" 0.3 ML [place], [place] - [address]

## 2019-10-08 MED FILL — UNIFINE PENTIPS 8MM 31G: 31G X 8 MM | 25 days supply | Qty: 100 | Fill #1

## 2019-10-08 MED FILL — LANTUS SOLOSTAR 100 UNITS/M: 100 | 24 days supply | Qty: 6 | Fill #1

## 2019-10-08 MED FILL — HUMALOG 100 UNITS/ML KWIKPE: 100 | 20 days supply | Qty: 3 | Fill #1

## 2019-11-11 MED FILL — LANTUS SOLOSTAR 100 UNITS/M: 100 | 24 days supply | Qty: 6 | Fill #0 | Status: TO

## 2019-11-11 MED FILL — LANTUS SOLOSTAR 100 UNITS/M: 100 | 24 days supply | Qty: 6 | Fill #0

## 2019-11-11 MED FILL — UNIFINE PENTIPS 8MM 31G: 31G X 8 MM | 25 days supply | Qty: 100 | Fill #0

## 2019-11-11 MED FILL — HUMALOG 100 UNITS/ML KWIKPE: 100 | 20 days supply | Qty: 3 | Fill #0 | Status: TO

## 2019-11-11 MED FILL — UNIFINE PENTIPS 8MM 31G: 31G X 8 MM | 25 days supply | Qty: 100 | Fill #0 | Status: TO

## 2019-11-11 MED FILL — HUMALOG 100 UNITS/ML KWIKPE: 100 | 20 days supply | Qty: 3 | Fill #0

## 2019-11-18 ENCOUNTER — Ambulatory Visit: Payer: Self-pay

## 2019-11-21 ENCOUNTER — Ambulatory Visit: Payer: Self-pay

## 2019-12-04 MED FILL — UNIFINE PENTIPS 8MM 31G: 31G X 8 MM | 25 days supply | Qty: 100 | Fill #1

## 2019-12-04 MED FILL — LANTUS SOLOSTAR 100 UNITS/M: 100 | 24 days supply | Qty: 6 | Fill #1

## 2019-12-04 MED FILL — HUMALOG 100 UNITS/ML KWIKPE: 100 | 20 days supply | Qty: 3 | Fill #1

## 2019-12-29 ENCOUNTER — Encounter: Payer: Self-pay | Admitting: Internal Medicine

## 2019-12-29 MED FILL — LANTUS SOLOSTAR 100 UNITS/M: 100 | 24 days supply | Qty: 6 | Fill #2

## 2019-12-29 MED FILL — HUMALOG 100 UNITS/ML KWIKPE: 100 | 20 days supply | Qty: 3 | Fill #2

## 2019-12-29 MED FILL — UNIFINE PENTIPS 8MM 31G: 31G X 8 MM | 25 days supply | Qty: 100 | Fill #2

## 2020-01-06 ENCOUNTER — Encounter: Payer: Self-pay | Admitting: *Deleted

## 2020-01-23 MED FILL — LANTUS SOLOSTAR 100 UNITS/M: 100 | 24 days supply | Qty: 6 | Fill #3

## 2020-01-23 MED FILL — UNIFINE PENTIPS 8MM 31G: 31G X 8 MM | 25 days supply | Qty: 100 | Fill #3

## 2020-01-23 MED FILL — HUMALOG 100 UNITS/ML KWIKPE: 100 | 20 days supply | Qty: 3 | Fill #3

## 2020-01-26 ENCOUNTER — Encounter: Payer: Self-pay | Admitting: Internal Medicine

## 2020-01-26 ENCOUNTER — Ambulatory Visit (INDEPENDENT_AMBULATORY_CARE_PROVIDER_SITE_OTHER): Payer: Self-pay | Admitting: Internal Medicine

## 2020-01-26 VITALS — BP 114/65 | HR 107 | Temp 98.3°F | Ht 67.0 in | Wt 135.9 lb

## 2020-01-26 DIAGNOSIS — E109 Type 1 diabetes mellitus without complications: Secondary | ICD-10-CM

## 2020-01-26 DIAGNOSIS — R300 Dysuria: Secondary | ICD-10-CM

## 2020-01-26 DIAGNOSIS — Z720 Tobacco use: Secondary | ICD-10-CM

## 2020-01-26 DIAGNOSIS — Z794 Long term (current) use of insulin: Secondary | ICD-10-CM

## 2020-01-26 HISTORY — DX: Dysuria: R30.0

## 2020-01-26 MED ORDER — NITROFURANTOIN MONOHYD MACRO 100 MG PO CAPS: 100.0000 mg | ORAL_CAPSULE | Freq: Two times a day (BID) | ORAL | 0 refills | Status: AC

## 2020-01-26 NOTE — Patient Instructions (Signed)
Thank you, Megan Silva for allowing [place] to provide your care today. Today we discussed UTI, DM.    I have ordered BMP, Urinalysis, Hgb A1C labs for you. I will call if any are abnormal.    I have place a referrals to [facility] for Diabetes assistance.   I have ordered the following tests: none   I have ordered the following medication/changed the following medications:  1. Nitrofurantoin 100 mg twice daily for 3 days   Please follow-up in a months to make adjustments to your diabetes medications..    Should you have any questions or concerns please call the internal medicine clinic at [phone].    Dellia Cloud, D.O. [facility] Health Internal Medicine

## 2020-01-26 NOTE — Assessment & Plan Note (Addendum)
Patient has a history of tobacco use, but denies smoking today.

## 2020-01-26 NOTE — Progress Notes (Signed)
   CC: Diabetes Mellitus  HPI:  [name] is a 24 y.o. female with a past medical history stated below and presents today for follow up for her Diabetes. Please see problem based assessment and plan for additional details.   Past Medical History:  Diagnosis Date  . Diabetes type 1, controlled ([facility])    Social History   Socioeconomic History  . Marital status: Single    Spouse name: Not on file  . Number of children: Not on file  . Years of education: Not on file  . Highest education level: Not on file  Occupational History  . Not on file  Tobacco Use  . Smoking status: Never Smoker  . Smokeless tobacco: Never Used  Substance and Sexual Activity  . Alcohol use: Yes    Comment: social  . Drug use: Never  . Sexual activity: Yes    Birth control/protection: None  Other Topics Concern  . Not on file  Social History Narrative  . Not on file   Social Determinants of Health   Financial Resource Strain:   . Difficulty of Paying Living Expenses:   Food Insecurity:   . Worried About Programme researcher, broadcasting/film/video in the Last Year:   . Barista in the Last Year:   Transportation Needs:   . Freight forwarder (Medical):   Marland Kitchen Lack of Transportation (Non-Medical):   Physical Activity:   . Days of Exercise per Week:   . Minutes of Exercise per Session:   Stress:   . Feeling of Stress :   Social Connections:   . Frequency of Communication with Friends and Family:   . Frequency of Social Gatherings with Friends and Family:   . Attends Religious Services:   . Active Member of Clubs or Organizations:   . Attends Banker Meetings:   Marland Kitchen Marital Status:   Intimate Partner Violence:   . Fear of Current or Ex-Partner:   . Emotionally Abused:   Marland Kitchen Physically Abused:   . Sexually Abused:     Family History  Problem Relation Age of Onset  . Diabetes Maternal Grandmother   . Cancer Paternal Grandmother     Review of Systems: ROS - All review of systems  are negative except what is noted on the A/P.    Vitals:   01/26/20 1328  BP: 114/65  Pulse: (!) 107  SpO2: 99%  Weight: 135 lb 14.4 oz (61.6 kg)  Height: 5\' 7"  (1.702 m)     Physical Exam: Physical Exam  Constitutional: She is oriented to person, place, and time and well-developed, well-nourished, and in no distress.  HENT:  Head: Normocephalic and atraumatic.  Eyes: EOM are normal.  Cardiovascular: Normal rate and intact distal pulses. Exam reveals no gallop and no friction rub.  No murmur heard. Pulmonary/Chest: Effort normal. No respiratory distress. She exhibits no tenderness.  Abdominal: Soft. She exhibits no distension. There is no abdominal tenderness.  Musculoskeletal:        General: No tenderness or edema. Normal range of motion.     Cervical back: Normal range of motion.  Neurological: She is oriented to person, place, and time.  Skin: Skin is warm and dry.     Assessment & Plan:   See Encounters Tab for problem based charting.  Patient discussed with Dr. 

## 2020-01-26 NOTE — Assessment & Plan Note (Addendum)
Presents with 1 week of dysuria, polyuria, and originally has some belly pain. She states that she did have some blood in her urine ,but recently started her menses this week. She does not have any recent antibiotic use.   Plan: - Nitrofurantoin 100 mg bid for 3 days - Urine cultures pending

## 2020-01-26 NOTE — Assessment & Plan Note (Addendum)
Patient presents for her yearly diabetes appointment. She is currently taking Humolog 5 units/meal and Lantus 15 units BID. She has applied to Medicaid.  She is waiting on approval prior to being started insulin pump.    She states that  blood sugars fluctuate widely throughout the day. She  She is having symptoms of fatigue, excess thirst, polyuria.   On evaluation today the patient is alert and oriented and does not appear to be having any symptoms of hypoglycemia.   Plan: - Continue  - Referral for ophthalmologist  - Referral for diabetes education.

## 2020-01-27 ENCOUNTER — Encounter: Payer: Medicaid Other | Admitting: Internal Medicine

## 2020-01-27 ENCOUNTER — Encounter: Payer: Self-pay | Admitting: Internal Medicine

## 2020-01-27 ENCOUNTER — Telehealth: Payer: Self-pay | Admitting: Internal Medicine

## 2020-01-27 LAB — POCT URINALYSIS DIPSTICK
Bilirubin, UA: NEGATIVE
Glucose, UA: POSITIVE — AB
Ketones, UA: NEGATIVE
Nitrite, UA: NEGATIVE
Protein, UA: NEGATIVE
Spec Grav, UA: <=1.005 — AB (ref 1.010–1.025)
Urobilinogen, UA: 0.2 E.U./dL
pH, UA: 5 (ref 5.0–8.0)

## 2020-01-27 LAB — MICROSCOPIC EXAMINATION
Bacteria, UA: NONE SEEN
Casts: NONE SEEN /lpf
Epithelial Cells (non renal): NONE SEEN /hpf (ref 0–10)
RBC, Urine: >30 /hpf — AB (ref 0–2)
WBC, UA: >30 /hpf — AB (ref 0–5)

## 2020-01-27 LAB — URINALYSIS, ROUTINE W REFLEX MICROSCOPIC
Bilirubin, UA: NEGATIVE
Ketones, UA: NEGATIVE
Nitrite, UA: NEGATIVE
Protein,UA: NEGATIVE
Specific Gravity, UA: >=1.03 — AB (ref 1.005–1.030)
Urobilinogen, Ur: 0.2 mg/dL (ref 0.2–1.0)
pH, UA: 5 (ref 5.0–7.5)

## 2020-01-27 LAB — BMP8+ANION GAP
Anion Gap: 20 mmol/L — ABNORMAL HIGH (ref 10.0–18.0)
BUN/Creatinine Ratio: 14 (ref 9–23)
BUN: 14 mg/dL (ref 6–20)
CO2: 22 mmol/L (ref 20–29)
Calcium: 10.6 mg/dL — ABNORMAL HIGH (ref 8.7–10.2)
Chloride: 88 mmol/L — ABNORMAL LOW (ref 96–106)
Creatinine, Ser: 0.97 mg/dL (ref 0.57–1.00)
GFR calc Af Amer: 97 mL/min/{1.73_m2} (ref >59)
GFR calc non Af Amer: 84 mL/min/{1.73_m2} (ref >59)
Glucose: 683 mg/dL (ref 65–99)
Potassium: 5 mmol/L (ref 3.5–5.2)
Sodium: 130 mmol/L — ABNORMAL LOW (ref 134–144)

## 2020-01-27 LAB — FERRITIN: Ferritin: 70 ng/mL (ref 15–150)

## 2020-01-27 LAB — HEMOGLOBIN A1C
Est. average glucose Bld gHb Est-mCnc: >398 mg/dL
Hgb A1c MFr Bld: >15.5 % — ABNORMAL HIGH (ref 4.8–5.6)

## 2020-01-27 NOTE — Telephone Encounter (Signed)
Dr. Marchia Bond contacted patient early in the morning of 5/10 regarding her lab result and to come to ED to be evaluated for DKA.

## 2020-01-27 NOTE — Telephone Encounter (Signed)
I called the patient this morning at 6:30 am due to an urgent lab result. I counseled her on her significantly elevated blood sugar and anion gap and told to her to go to the emergency room as soon as possible to get treatment. I made her aware of the risk of not seeking immediate medical treatment. She stated that she understood and will go to the ED.   Megan Silva, D.O. [facility] Internal Medicine, PGY-1 Pager: [phone], Phone: [phone] Date 01/27/2020 Time 7:00 AM

## 2020-01-28 ENCOUNTER — Telehealth: Payer: Self-pay | Admitting: Internal Medicine

## 2020-01-28 LAB — URINE CULTURE

## 2020-01-28 NOTE — Telephone Encounter (Signed)
I tried to reach the patient again this afternoon regarding her lab results and need for urgent care. I was unable to reach her. I called the parent's primary contact, Adeline Robidoux (patient's mother) in order to check on the patients well being. She states that she spoke with her daughter yesterday evening and she appeared to be doing ok. She states that she would reach out to [place] today to check on her well being. I will continue to try to get ahold of the patient today.    Dellia Cloud, D.O. [facility] Internal Medicine, PGY-1 Pager: [phone], Phone: [phone] Date 01/28/2020 Time 3:57 PM

## 2020-01-28 NOTE — Telephone Encounter (Signed)
Attempted to call patient to check on her since our last telephone encounter. I was unable to reach her, but left a HIPPA compliant voicemail.  Dellia Cloud, D.O. [facility] Internal Medicine, PGY-1 Pager: [phone], Phone: [phone] Date 01/28/2020 Time 1:05 PM

## 2020-01-28 NOTE — Progress Notes (Signed)
Internal Medicine Clinic Attending  Case discussed with Dr. Marchia Bond at the time of the visit.  We reviewed the resident's history and exam and pertinent patient test results.  I agree with the assessment, diagnosis, and plan of care documented in the resident's note.    Patient BMP was noted to have a blood glucose of 683 and anion gap of 22 concerning for DKA.  Patient was contacted by Dr. Marchia Bond and told to go to the ED for further evaluation and likely admission.  Patient stated she would go to the ED for further evaluation but has not yet been seen at [facility].  Dr. Marchia Bond attempted to call the patient again today but did not receive an answer.  We will call the patient again this afternoon and see if she can come to the ED for further evaluation

## 2020-02-05 ENCOUNTER — Telehealth: Payer: Self-pay | Admitting: Internal Medicine

## 2020-02-05 NOTE — Telephone Encounter (Signed)
I tried to call the patient again this morning to check on her. I have tried to reach her several times over the last 2 weeks without success. Patient urine culture grew GBS. She was given a course of nitrofurantoin during our clinic visit. I was hoping to assess her urinary symptoms and determine her need for additional antibiotic therapy. If patient calls back, she will need to schedule an office visit to further address her diabetes.   Dellia Cloud, D.O. [facility] Internal Medicine, PGY-1 Pager: [phone], Phone: [phone] Date 02/05/2020 Time 7:49 AM

## 2020-02-10 ENCOUNTER — Telehealth: Payer: Self-pay | Admitting: *Deleted

## 2020-02-10 ENCOUNTER — Encounter: Payer: Medicaid Other | Admitting: Internal Medicine

## 2020-02-10 NOTE — Telephone Encounter (Signed)
CMA attempted to follow up with pt after she was a no show for today's appt with pcp-No answer, message left on recorder for return call to f/u last visit and to make an appt with pcp.Megan Spittle Cassady5/25/20213:07 PM

## 2020-02-11 ENCOUNTER — Encounter: Payer: Self-pay | Admitting: Internal Medicine

## 2020-02-18 ENCOUNTER — Encounter: Payer: Self-pay | Admitting: Internal Medicine

## 2020-02-18 ENCOUNTER — Ambulatory Visit: Payer: Medicaid Other

## 2020-02-18 MED FILL — LANTUS SOLOSTAR 100 UNITS/M: 100 | 24 days supply | Qty: 6 | Fill #4

## 2020-02-18 MED FILL — HUMALOG 100 UNITS/ML KWIKPE: 100 | 20 days supply | Qty: 3 | Fill #4

## 2020-02-18 MED FILL — UNIFINE PENTIPS 8MM 31G: 31G X 8 MM | 25 days supply | Qty: 100 | Fill #4

## 2020-02-18 NOTE — Addendum Note (Signed)
Addended by: Neomia Dear on: 02/18/2020 05:56 PM   Modules accepted: Orders

## 2020-03-02 ENCOUNTER — Other Ambulatory Visit: Payer: Self-pay | Admitting: *Deleted

## 2020-03-02 DIAGNOSIS — O24913 Unspecified diabetes mellitus in pregnancy, third trimester: Secondary | ICD-10-CM

## 2020-03-02 MED FILL — HUMALOG 100 UNITS/ML KWIKPE: 100 | 20 days supply | Qty: 3 | Fill #5

## 2020-03-02 MED FILL — LANTUS SOLOSTAR 100 UNITS/M: 100 | 24 days supply | Qty: 6 | Fill #5

## 2020-03-03 MED ORDER — "PEN NEEDLES 5/16"" 31G X 8 MM MISC": 1.0000 | Freq: Four times a day (QID) | 1 refills | Status: DC

## 2020-03-04 ENCOUNTER — Other Ambulatory Visit: Payer: Self-pay | Admitting: Internal Medicine

## 2020-03-04 DIAGNOSIS — O24913 Unspecified diabetes mellitus in pregnancy, third trimester: Secondary | ICD-10-CM

## 2020-03-09 ENCOUNTER — Encounter: Payer: Medicaid Other | Admitting: Internal Medicine

## 2020-03-30 ENCOUNTER — Telehealth: Payer: Self-pay | Admitting: Internal Medicine

## 2020-03-30 NOTE — Telephone Encounter (Signed)
Hey Chilon,  I am not sure we will be able to write a referral for a patient who has move away and is no longer being seen by our clinic. She may need to establish care with a new PCP in [place]. I will forward this to Lela to clarify if this is possible.   Thank you,  BC

## 2020-03-30 NOTE — Telephone Encounter (Signed)
Pt called requesting a New Ref. Pt was seen by you on 01/26/2020 and she was instructed to see a [place] Parcel but moved away. Pt 's states she lives in [place], [place] and has found an office to treat her Diabetes called [facility]. The pt states she really needs to be seen for her diabetes and they will only accept her if she has a referral. Pls advise if you will be able to place a Refl for her request

## 2020-03-31 ENCOUNTER — Encounter: Payer: Self-pay | Admitting: Internal Medicine

## 2020-03-31 NOTE — Telephone Encounter (Signed)
Spoke with the pt and explained she will need to sign a Medical Release. Pt verbalized understanding and will try to find another office located in [place] to see.

## 2020-03-31 NOTE — Telephone Encounter (Signed)
Sent to [employer] s. To speak w/ pt. Nurse called the office and office faxed a referral page, she will address this

## 2020-06-18 ENCOUNTER — Encounter: Payer: Self-pay | Admitting: Internal Medicine

## 2020-09-18 NOTE — L&D Delivery Note (Signed)
OB/GYN Faculty Practice Delivery Note  Megan Silva is a 25 y.o. G2P1001 s/p vaginal delivery at Unknown GA (reportedly approximately 24 weeks per second trimester ultrasound per patient. She was admitted for preterm labor s/p PPROM which occurred shortly before 0600 on 12/11/20. Of note, pt with uncontrolled Type 1 Diabetes and found to be hyperglycemia to 539 on arrival, consistent with DKA.  ROM: 0h 54m with clear fluid GBS Status: unknown Maximum Maternal Temperature: 97.21F  Labor Progress: On arrival pt found to have complete cervical dilation with fetal parts palpated in the vaginal vault. Unable to confirm fetal heart tones on arrival to MAU. Dr. Barb Merino promptly presented to bedside when notified and Dr. Shawnie Pons was also immediately notified and promptly presented as well.  Delivery Date/Time: 12/11/20 at 0640 Delivery: Called to room and patient was complete and pushing. Fetus delivered without complication using breech maneuvers. No nuchal cord present. Infant without visible movement and no palpable heart beat at time of delivery. Cord immediately clamped x 2, and cut by author. Fetus immediately passed to NICU team at bedside. Unable to obtain cord blood. Placenta delivered spontaneously with gentle cord traction. Fundus firm with massage and Pitocin. Labia, perineum, vagina, and cervix were inspected, without evidence of lacerations. I&O cath was placed to obtain urine sample for UDS and urinalysis.  Of note, fetus appeared bruised with overlapping sutures. Per NICU team, GA of fetus estimated at 22-23 weeks. Medical team and NICU attending debriefed with pt s/p delivery.   Placenta: 3-vessel cord, sent to pathology Complications: preterm delivery s/p PPROM, uncontrolled Type 1 DM with likely DKA on arrival Lacerations: none EBL: 25 ml Analgesia: none  Infant: non-viable female  weight per medical record  S/p delivery, pt noted to have oxygen desaturation to 80s on room air in  addition to worsening tachypnea. Given change in respiratory status, rapid response team was called to evaluate patient for higher level of care. Decision was made to transfer to ICU until resolution of DKA. ICU Team was called and pt was accepted to ICU. OB Team will remain primary and ICU team will follow. Plan to transfer pt to [facility] care s/p resolution of acidosis and anion gap.  Lynnda Shields, MD OB/GYN Fellow, Faculty Practice

## 2020-12-11 ENCOUNTER — Inpatient Hospital Stay (HOSPITAL_COMMUNITY)
Admission: AD | Admit: 2020-12-11 | Discharge: 2020-12-14 | DRG: 805 | Disposition: A | Discharge status: Home / Self Care | Payer: Medicaid Other | Attending: Obstetrics & Gynecology | Admitting: Obstetrics & Gynecology

## 2020-12-11 ENCOUNTER — Encounter (HOSPITAL_COMMUNITY): Payer: Self-pay

## 2020-12-11 ENCOUNTER — Other Ambulatory Visit: Payer: Self-pay

## 2020-12-11 DIAGNOSIS — Z20822 Contact with and (suspected) exposure to covid-19: Secondary | ICD-10-CM | POA: Diagnosis present

## 2020-12-11 DIAGNOSIS — O364XX0 Maternal care for intrauterine death, not applicable or unspecified: Secondary | ICD-10-CM | POA: Diagnosis not present

## 2020-12-11 DIAGNOSIS — O99892 Other specified diseases and conditions complicating childbirth: Secondary | ICD-10-CM | POA: Diagnosis not present

## 2020-12-11 DIAGNOSIS — O34211 Maternal care for low transverse scar from previous cesarean delivery: Secondary | ICD-10-CM

## 2020-12-11 DIAGNOSIS — Z794 Long term (current) use of insulin: Secondary | ICD-10-CM

## 2020-12-11 DIAGNOSIS — E1010 Type 1 diabetes mellitus with ketoacidosis without coma: Secondary | ICD-10-CM | POA: Diagnosis present

## 2020-12-11 DIAGNOSIS — N179 Acute kidney failure, unspecified: Secondary | ICD-10-CM | POA: Diagnosis not present

## 2020-12-11 DIAGNOSIS — O42912 Preterm premature rupture of membranes, unspecified as to length of time between rupture and onset of labor, second trimester: Principal | ICD-10-CM | POA: Diagnosis present

## 2020-12-11 DIAGNOSIS — O252 Malnutrition in childbirth: Secondary | ICD-10-CM | POA: Diagnosis present

## 2020-12-11 DIAGNOSIS — O34219 Maternal care for unspecified type scar from previous cesarean delivery: Secondary | ICD-10-CM | POA: Diagnosis present

## 2020-12-11 DIAGNOSIS — O2402 Pre-existing diabetes mellitus, type 1, in childbirth: Secondary | ICD-10-CM | POA: Diagnosis present

## 2020-12-11 DIAGNOSIS — Z3A24 24 weeks gestation of pregnancy: Secondary | ICD-10-CM | POA: Diagnosis not present

## 2020-12-11 DIAGNOSIS — Z9114 Patient's other noncompliance with medication regimen: Secondary | ICD-10-CM

## 2020-12-11 DIAGNOSIS — O9902 Anemia complicating childbirth: Secondary | ICD-10-CM | POA: Diagnosis present

## 2020-12-11 DIAGNOSIS — O24913 Unspecified diabetes mellitus in pregnancy, third trimester: Secondary | ICD-10-CM

## 2020-12-11 DIAGNOSIS — Z3A23 23 weeks gestation of pregnancy: Secondary | ICD-10-CM

## 2020-12-11 DIAGNOSIS — O2403 Pre-existing diabetes mellitus, type 1, in the puerperium: Secondary | ICD-10-CM | POA: Diagnosis not present

## 2020-12-11 LAB — URINALYSIS, ROUTINE W REFLEX MICROSCOPIC
Bilirubin Urine: NEGATIVE
Glucose, UA: >=500 mg/dL — AB
Ketones, ur: 80 mg/dL — AB
Leukocytes,Ua: NEGATIVE
Nitrite: NEGATIVE
Protein, ur: 30 mg/dL — AB
Specific Gravity, Urine: 1.023 (ref 1.005–1.030)
pH: 5 (ref 5.0–8.0)

## 2020-12-11 LAB — BLOOD GAS, VENOUS
Acid-base deficit: 10.8 mmol/L — ABNORMAL HIGH (ref 0.0–2.0)
Acid-base deficit: 14.5 mmol/L — ABNORMAL HIGH (ref 0.0–2.0)
Acid-base deficit: 18.6 mmol/L — ABNORMAL HIGH (ref 0.0–2.0)
Acid-base deficit: 8.5 mmol/L — ABNORMAL HIGH (ref 0.0–2.0)
Acid-base deficit: 9.3 mmol/L — ABNORMAL HIGH (ref 0.0–2.0)
Bicarbonate: 11.4 mmol/L — ABNORMAL LOW (ref 20.0–28.0)
Bicarbonate: 14 mmol/L — ABNORMAL LOW (ref 20.0–28.0)
Bicarbonate: 15.6 mmol/L — ABNORMAL LOW (ref 20.0–28.0)
Bicarbonate: 15.9 mmol/L — ABNORMAL LOW (ref 20.0–28.0)
Bicarbonate: 8.3 mmol/L — ABNORMAL LOW (ref 20.0–28.0)
Drawn by: 164
Drawn by: 164
Drawn by: 5694
FIO2: 21
FIO2: 21
FIO2: 21
O2 Saturation: 29.1 %
O2 Saturation: 40.9 %
O2 Saturation: 62.4 %
O2 Saturation: 73.5 %
O2 Saturation: 96 %
Patient temperature: 36.2
Patient temperature: 37
Patient temperature: 37
Patient temperature: 37
pCO2, Ven: 22.4 mmHg — ABNORMAL LOW (ref 44.0–60.0)
pCO2, Ven: 27.3 mmHg — ABNORMAL LOW (ref 44.0–60.0)
pCO2, Ven: 27.4 mmHg — ABNORMAL LOW (ref 44.0–60.0)
pCO2, Ven: 28.6 mmHg — ABNORMAL LOW (ref 44.0–60.0)
pCO2, Ven: 30.3 mmHg — ABNORMAL LOW (ref 44.0–60.0)
pH, Ven: 7.193 — CL (ref 7.250–7.430)
pH, Ven: 7.243 — ABNORMAL LOW (ref 7.250–7.430)
pH, Ven: 7.327 (ref 7.250–7.430)
pH, Ven: 7.329 (ref 7.250–7.430)
pH, Ven: 7.362 (ref 7.250–7.430)
pO2, Ven: 37.1 mmHg (ref 32.0–45.0)
pO2, Ven: 39.9 mmHg (ref 32.0–45.0)
pO2, Ven: 78.8 mmHg — ABNORMAL HIGH (ref 32.0–45.0)

## 2020-12-11 LAB — BASIC METABOLIC PANEL
Anion gap: 14 (ref 5–15)
Anion gap: 9 (ref 5–15)
Anion gap: 10 (ref 5–15)
BUN: 11 mg/dL (ref 6–20)
BUN: 8 mg/dL (ref 6–20)
BUN: 8 mg/dL (ref 6–20)
CO2: 12 mmol/L — ABNORMAL LOW (ref 22–32)
CO2: 15 mmol/L — ABNORMAL LOW (ref 22–32)
CO2: 13 mmol/L — ABNORMAL LOW (ref 22–32)
Calcium: 8.5 mg/dL — ABNORMAL LOW (ref 8.9–10.3)
Calcium: 8.4 mg/dL — ABNORMAL LOW (ref 8.9–10.3)
Calcium: 8.3 mg/dL — ABNORMAL LOW (ref 8.9–10.3)
Chloride: 110 mmol/L (ref 98–111)
Chloride: 110 mmol/L (ref 98–111)
Chloride: 110 mmol/L (ref 98–111)
Creatinine, Ser: 0.94 mg/dL (ref 0.44–1.00)
Creatinine, Ser: 0.69 mg/dL (ref 0.44–1.00)
Creatinine, Ser: 0.7 mg/dL (ref 0.44–1.00)
GFR, Estimated: >60 mL/min (ref >60)
GFR, Estimated: >60 mL/min (ref >60)
GFR, Estimated: >60 mL/min (ref >60)
Glucose, Bld: 108 mg/dL — ABNORMAL HIGH (ref 70–99)
Glucose, Bld: 150 mg/dL — ABNORMAL HIGH (ref 70–99)
Glucose, Bld: 166 mg/dL — ABNORMAL HIGH (ref 70–99)
Potassium: 3.6 mmol/L (ref 3.5–5.1)
Potassium: 4.4 mmol/L (ref 3.5–5.1)
Potassium: 4.3 mmol/L (ref 3.5–5.1)
Sodium: 136 mmol/L (ref 135–145)
Sodium: 134 mmol/L — ABNORMAL LOW (ref 135–145)
Sodium: 133 mmol/L — ABNORMAL LOW (ref 135–145)

## 2020-12-11 LAB — GLUCOSE, CAPILLARY
Glucose-Capillary: 539 mg/dL (ref 70–99)
Glucose-Capillary: 520 mg/dL (ref 70–99)
Glucose-Capillary: 391 mg/dL — ABNORMAL HIGH (ref 70–99)
Glucose-Capillary: 246 mg/dL — ABNORMAL HIGH (ref 70–99)
Glucose-Capillary: 148 mg/dL — ABNORMAL HIGH (ref 70–99)
Glucose-Capillary: 118 mg/dL — ABNORMAL HIGH (ref 70–99)
Glucose-Capillary: 149 mg/dL — ABNORMAL HIGH (ref 70–99)
Glucose-Capillary: 144 mg/dL — ABNORMAL HIGH (ref 70–99)
Glucose-Capillary: 133 mg/dL — ABNORMAL HIGH (ref 70–99)
Glucose-Capillary: 156 mg/dL — ABNORMAL HIGH (ref 70–99)
Glucose-Capillary: 139 mg/dL — ABNORMAL HIGH (ref 70–99)
Glucose-Capillary: 134 mg/dL — ABNORMAL HIGH (ref 70–99)
Glucose-Capillary: 192 mg/dL — ABNORMAL HIGH (ref 70–99)
Glucose-Capillary: 282 mg/dL — ABNORMAL HIGH (ref 70–99)
Glucose-Capillary: 271 mg/dL — ABNORMAL HIGH (ref 70–99)
Glucose-Capillary: 245 mg/dL — ABNORMAL HIGH (ref 70–99)

## 2020-12-11 LAB — BETA-HYDROXYBUTYRIC ACID
Beta-Hydroxybutyric Acid: >8 mmol/L — ABNORMAL HIGH (ref 0.05–0.27)
Beta-Hydroxybutyric Acid: 4.32 mmol/L — ABNORMAL HIGH (ref 0.05–0.27)
Beta-Hydroxybutyric Acid: 3.04 mmol/L — ABNORMAL HIGH (ref 0.05–0.27)

## 2020-12-11 LAB — CBC
HCT: 41.1 % (ref 36.0–46.0)
Hemoglobin: 12.5 g/dL (ref 12.0–15.0)
MCH: 24.2 pg — ABNORMAL LOW (ref 26.0–34.0)
MCHC: 30.4 g/dL (ref 30.0–36.0)
MCV: 79.5 fL — ABNORMAL LOW (ref 80.0–100.0)
Platelets: 527 10*3/uL — ABNORMAL HIGH (ref 150–400)
RBC: 5.17 MIL/uL — ABNORMAL HIGH (ref 3.87–5.11)
RDW: 14.5 % (ref 11.5–15.5)
WBC: 19.5 10*3/uL — ABNORMAL HIGH (ref 4.0–10.5)
nRBC: 0 % (ref 0.0–0.2)

## 2020-12-11 LAB — COMPREHENSIVE METABOLIC PANEL
ALT: 13 U/L (ref 0–44)
AST: 20 U/L (ref 15–41)
Albumin: 3.6 g/dL (ref 3.5–5.0)
Alkaline Phosphatase: 92 U/L (ref 38–126)
BUN: 17 mg/dL (ref 6–20)
CO2: <7 mmol/L — ABNORMAL LOW (ref 22–32)
Calcium: 9.2 mg/dL (ref 8.9–10.3)
Chloride: 98 mmol/L (ref 98–111)
Creatinine, Ser: 1.57 mg/dL — ABNORMAL HIGH (ref 0.44–1.00)
GFR, Estimated: 48 mL/min — ABNORMAL LOW (ref >60)
Glucose, Bld: 598 mg/dL (ref 70–99)
Potassium: 4.7 mmol/L (ref 3.5–5.1)
Sodium: 128 mmol/L — ABNORMAL LOW (ref 135–145)
Total Bilirubin: 2 mg/dL — ABNORMAL HIGH (ref 0.3–1.2)
Total Protein: 8.2 g/dL — ABNORMAL HIGH (ref 6.5–8.1)

## 2020-12-11 LAB — LACTIC ACID, PLASMA
Lactic Acid, Venous: 3.1 mmol/L (ref 0.5–1.9)
Lactic Acid, Venous: 2.7 mmol/L (ref 0.5–1.9)
Lactic Acid, Venous: 3.3 mmol/L (ref 0.5–1.9)
Lactic Acid, Venous: 1.9 mmol/L (ref 0.5–1.9)
Lactic Acid, Venous: 1.3 mmol/L (ref 0.5–1.9)

## 2020-12-11 LAB — HEPATITIS B SURFACE ANTIGEN: Hepatitis B Surface Ag: NONREACTIVE

## 2020-12-11 LAB — HEMOGLOBIN A1C
Hgb A1c MFr Bld: 13.1 % — ABNORMAL HIGH (ref 4.8–5.6)
Mean Plasma Glucose: 329.27 mg/dL

## 2020-12-11 LAB — MRSA PCR SCREENING: MRSA by PCR: NEGATIVE

## 2020-12-11 LAB — LACTATE DEHYDROGENASE: LDH: 132 U/L (ref 98–192)

## 2020-12-11 LAB — RPR: RPR Ser Ql: NONREACTIVE

## 2020-12-11 LAB — HIV ANTIBODY (ROUTINE TESTING W REFLEX): HIV Screen 4th Generation wRfx: NONREACTIVE

## 2020-12-11 MED ORDER — OXYCODONE-ACETAMINOPHEN 5-325 MG PO TABS: 1.0000 | ORAL_TABLET | ORAL | Status: DC | PRN

## 2020-12-11 MED ORDER — PRENATAL MULTIVITAMIN CH
1.0000 | ORAL_TABLET | Freq: Every day | ORAL | Status: DC
  Administered 2020-12-11 – 2020-12-12 (×2): 1 via ORAL
  Filled 2020-12-11 (×3): qty 1

## 2020-12-11 MED ORDER — SODIUM CHLORIDE 0.9 % IV BOLUS
2000.0000 mL | Freq: Once | INTRAVENOUS | Status: AC
  Administered 2020-12-11: 2000 mL via INTRAVENOUS

## 2020-12-11 MED ORDER — INSULIN GLARGINE 100 UNIT/ML ~~LOC~~ SOLN
25.0000 [IU] | Freq: Every day | SUBCUTANEOUS | Status: DC
  Administered 2020-12-11 – 2020-12-14 (×4): 25 [IU] via SUBCUTANEOUS
  Filled 2020-12-11 (×5): qty 0.25

## 2020-12-11 MED ORDER — SENNOSIDES-DOCUSATE SODIUM 8.6-50 MG PO TABS
2.0000 | ORAL_TABLET | Freq: Every day | ORAL | Status: DC
  Administered 2020-12-12 – 2020-12-14 (×3): 2 via ORAL
  Filled 2020-12-11 (×3): qty 2

## 2020-12-11 MED ORDER — DEXTROSE 50 % IV SOLN: 0.0000 mL | INTRAVENOUS | Status: DC | PRN

## 2020-12-11 MED ORDER — IBUPROFEN 600 MG PO TABS
600.0000 mg | ORAL_TABLET | Freq: Four times a day (QID) | ORAL | Status: DC | PRN
  Administered 2020-12-12 – 2020-12-14 (×4): 600 mg via ORAL
  Filled 2020-12-11 (×4): qty 1

## 2020-12-11 MED ORDER — ONDANSETRON HCL 4 MG/2ML IJ SOLN: 4.0000 mg | INTRAMUSCULAR | Status: DC | PRN

## 2020-12-11 MED ORDER — COCONUT OIL OIL: 1.0000 "application " | TOPICAL_OIL | Status: DC | PRN

## 2020-12-11 MED ORDER — ACETAMINOPHEN 325 MG PO TABS
650.0000 mg | ORAL_TABLET | Freq: Four times a day (QID) | ORAL | Status: DC | PRN
  Administered 2020-12-11 – 2020-12-14 (×7): 650 mg via ORAL
  Filled 2020-12-11 (×7): qty 2

## 2020-12-11 MED ORDER — LACTATED RINGERS IV SOLN: INTRAVENOUS | Status: DC

## 2020-12-11 MED ORDER — OXYCODONE-ACETAMINOPHEN 5-325 MG PO TABS: 2.0000 | ORAL_TABLET | ORAL | Status: DC | PRN

## 2020-12-11 MED ORDER — INSULIN REGULAR(HUMAN) IN NACL 100-0.9 UT/100ML-% IV SOLN
INTRAVENOUS | Status: DC
  Administered 2020-12-11: 10 [IU]/h via INTRAVENOUS
  Filled 2020-12-11: qty 100

## 2020-12-11 MED ORDER — POTASSIUM CHLORIDE 10 MEQ/100ML IV SOLN
10.0000 meq | INTRAVENOUS | Status: AC
  Administered 2020-12-11 (×2): 10 meq via INTRAVENOUS
  Filled 2020-12-11 (×2): qty 100

## 2020-12-11 MED ORDER — INSULIN REGULAR(HUMAN) IN NACL 100-0.9 UT/100ML-% IV SOLN: INTRAVENOUS | Status: DC

## 2020-12-11 MED ORDER — SIMETHICONE 80 MG PO CHEW
80.0000 mg | CHEWABLE_TABLET | ORAL | Status: DC | PRN
  Administered 2020-12-12: 80 mg via ORAL
  Filled 2020-12-11: qty 1

## 2020-12-11 MED ORDER — INSULIN ASPART 100 UNIT/ML ~~LOC~~ SOLN: 0.0000 [IU] | Freq: Every day | SUBCUTANEOUS | Status: DC

## 2020-12-11 MED ORDER — LIDOCAINE HCL (PF) 1 % IJ SOLN: 30.0000 mL | INTRAMUSCULAR | Status: DC | PRN

## 2020-12-11 MED ORDER — OXYTOCIN-SODIUM CHLORIDE 30-0.9 UT/500ML-% IV SOLN
2.5000 [IU]/h | INTRAVENOUS | Status: DC
  Filled 2020-12-11: qty 500

## 2020-12-11 MED ORDER — INSULIN ASPART 100 UNIT/ML ~~LOC~~ SOLN
0.0000 [IU] | SUBCUTANEOUS | Status: DC
  Administered 2020-12-11: 5 [IU] via SUBCUTANEOUS
  Administered 2020-12-12: 11 [IU] via SUBCUTANEOUS

## 2020-12-11 MED ORDER — INSULIN ASPART 100 UNIT/ML ~~LOC~~ SOLN: 0.0000 [IU] | SUBCUTANEOUS | Status: DC

## 2020-12-11 MED ORDER — BENZOCAINE-MENTHOL 20-0.5 % EX AERO: 1.0000 "application " | INHALATION_SPRAY | CUTANEOUS | Status: DC | PRN

## 2020-12-11 MED ORDER — DIBUCAINE (PERIANAL) 1 % EX OINT
1.0000 | TOPICAL_OINTMENT | CUTANEOUS | Status: DC | PRN
Start: 2020-12-11 — End: 2020-12-14

## 2020-12-11 MED ORDER — OXYTOCIN BOLUS FROM INFUSION
333.0000 mL | Freq: Once | INTRAVENOUS | Status: AC
  Administered 2020-12-11: 333 mL via INTRAVENOUS

## 2020-12-11 MED ORDER — INSULIN ASPART 100 UNIT/ML ~~LOC~~ SOLN: 0.0000 [IU] | Freq: Three times a day (TID) | SUBCUTANEOUS | Status: DC

## 2020-12-11 MED ORDER — ONDANSETRON HCL 4 MG/2ML IJ SOLN: 4.0000 mg | Freq: Four times a day (QID) | INTRAMUSCULAR | Status: DC | PRN

## 2020-12-11 MED ORDER — WITCH HAZEL-GLYCERIN EX PADS
1.0000 "application " | MEDICATED_PAD | CUTANEOUS | Status: DC | PRN
  Filled 2020-12-11: qty 100

## 2020-12-11 MED ORDER — ONDANSETRON HCL 4 MG PO TABS: 4.0000 mg | ORAL_TABLET | ORAL | Status: DC | PRN

## 2020-12-11 MED ORDER — ACETAMINOPHEN 325 MG PO TABS: 650.0000 mg | ORAL_TABLET | ORAL | Status: DC | PRN

## 2020-12-11 MED ORDER — LACTATED RINGERS IV SOLN: 500.0000 mL | INTRAVENOUS | Status: DC | PRN

## 2020-12-11 MED ORDER — TETANUS-DIPHTH-ACELL PERTUSSIS 5-2.5-18.5 LF-MCG/0.5 IM SUSY: 0.5000 mL | PREFILLED_SYRINGE | Freq: Once | INTRAMUSCULAR | Status: DC

## 2020-12-11 MED ORDER — SOD CITRATE-CITRIC ACID 500-334 MG/5ML PO SOLN: 30.0000 mL | ORAL | Status: DC | PRN

## 2020-12-11 MED ORDER — CHLORHEXIDINE GLUCONATE CLOTH 2 % EX PADS: 6.0000 | MEDICATED_PAD | Freq: Every day | CUTANEOUS | Status: DC

## 2020-12-11 MED ORDER — INSULIN ASPART 100 UNIT/ML IV SOLN
10.0000 [IU] | Freq: Once | INTRAVENOUS | Status: AC
  Administered 2020-12-11: 10 [IU] via INTRAVENOUS

## 2020-12-11 MED ORDER — DEXTROSE IN LACTATED RINGERS 5 % IV SOLN: INTRAVENOUS | Status: DC

## 2020-12-11 NOTE — Progress Notes (Signed)
Called OB Rapid Response to complete fundal massage. Geoffery Spruce, RN on her way.

## 2020-12-11 NOTE — Progress Notes (Signed)
Patient felt the urge to push and started pushing around 0630am. Patient pushed for approximately . While pushing, fetal ultrasound was adjusted to determine fetal heart rate. RN unable assess accurate fetal heart rate. RN verbalized to MD she believed ultrasound was tracing maternal heart rate while pushing. MD acknowledge. No new orders for interventions from MD. RN continued to adjust ultrasound in attempt to trace fetal heart rate.  At 0640am fetus delivered with no heart tones.   Conservation officer, nature

## 2020-12-11 NOTE — Significant Event (Signed)
Rapid Response Event Note   Reason for Call Diabetic ketoacidosis  Initial Focused Assessment:  Received a call from LD nurses stating that this patient had been feeling bad for the days leading up to this.  The patient had been having vomiting and reported that she has not been eating the three days before.  She reports that she thought that she had Covid-19 but that was negative.  She reports that she has missed her doses of insulin prior to coming to the hospital.    When admitted it was found that she was in DKA, with her CBG in the high 500's.  Patient delivered her child at 24 weeks.    Patient denies SOB, chest pain, difficulty breathing.  She also states that her pain is related to the delivery.  She is AOx4  BP 134/73 HR 90 RR 22 O2 99% Venti mask    Interventions:  Venti mask 4L/31 % Tele monitor placed Vitals taken  Plan of Care:  It is my recommendation that this patient be transferred to ICU level of care due to her DKA.    Event Summary:   MD Notified: OB attending Call Time: 0815 Arrival Time: 0737 End Time: 0845  Andrey Spearman, RN

## 2020-12-11 NOTE — MAU Note (Signed)
Pt pulled away when trying to do covid swab and refused to have it done.

## 2020-12-11 NOTE — Progress Notes (Addendum)
Pt presented to MAU via EMS with c/o SROM and q63min contractions with bloody show at ~[redacted]wks gestation. Recently relocated to [place] from [place], very little to no [facility]. Known diabetic, glucose=540 per EMS. Pt able to answer questions and allowed EMS to start IV, was unresponsive to our requests and combative when RN tried to perform Covid swab. Allowed brief cervical exam, long enough to feel that she was not complete and undeterminable presenting part. Grossly ruptured. Very difficult to obtain FHT tracing, HR 128-99, NICU and L&D notified. Bedside U/S obtained to confirm cephalic presentation - head seen above pubic bone. Report given to Lynnda Shields, MD at [phone] and pt transported to L&D.  Edd Arbour, CNM, MSN, IBCLC Certified Nurse Midwife, [facility]

## 2020-12-11 NOTE — H&P (Addendum)
OBSTETRIC ADMISSION HISTORY AND PHYSICAL  Megan Silva is a 25 y.o. female G2P1001 with IUP at approximately [redacted] weeks GA by second trimester ultrasound presenting for spontaneous labor s/p PPROM. Pt with history of uncontrolled Type 1 diabetes. She denies blurry vision, headaches or peripheral edema, and RUQ pain. Unable to determine last +FM. Per EMS pt with 3 days of emesis prior to arrival. No other known infectious symptoms per EMS.  She received her prenatal care in [place]. Pt recently moved to [facility] and has not established care in [place].  Dating: By ultrasound in second trimester --->  Estimated Date of Delivery: None noted.  Sono:  No records but reported  Prenatal History/Complications:  -Type 1 Diabetes (h/o DKA on long and short-acting insulin) -Anemia -Limited PNC -H/o CS x1 at [redacted] weeks GA  Past Medical History: Past Medical History:  Diagnosis Date  . Diabetes type 1, controlled (HCC)     Past Surgical History: Past Surgical History:  Procedure Laterality Date  . CESAREAN SECTION N/A 07/06/2018   Procedure: CESAREAN SECTION;  Surgeon: Hermina Staggers, MD;  Location: [facility] BIRTHING SUITES;  Service: Obstetrics;  Laterality: N/A;  . WISDOM TOOTH EXTRACTION      Obstetrical History: OB History    Gravida  2   Para  1   Term  1   Preterm  0   AB  0   Living  1     SAB  0   IAB  0   Ectopic  0   Multiple  0   Live Births  1           Social History Social History   Socioeconomic History  . Marital status: Single    Spouse name: Not on file  . Number of children: Not on file  . Years of education: Not on file  . Highest education level: Not on file  Occupational History  . Not on file  Tobacco Use  . Smoking status: Never Smoker  . Smokeless tobacco: Never Used  Vaping Use  . Vaping Use: Never used  Substance and Sexual Activity  . Alcohol use: Yes    Comment: social  . Drug use: Never  . Sexual activity: Yes    Birth  control/protection: None  Other Topics Concern  . Not on file  Social History Narrative  . Not on file   Social Determinants of Health   Financial Resource Strain: Not on file  Food Insecurity: Not on file  Transportation Needs: Not on file  Physical Activity: Not on file  Stress: Not on file  Social Connections: Not on file    Family History: Family History  Problem Relation Age of Onset  . Diabetes Maternal Grandmother   . Cancer Paternal Grandmother     Allergies: No Known Allergies  Medications Prior to Admission  Medication Sig Dispense Refill Last Dose  . Insulin Glargine (LANTUS) 100 UNIT/ML Solostar Pen Inject 25 Units into the skin at bedtime. 15 mL 11   . insulin lispro (HUMALOG KWIKPEN) 100 UNIT/ML KwikPen Inject 0.05 mLs (5 Units total) into the skin 3 (three) times daily with meals. 15 mL 11   . Insulin Pen Needle (PEN NEEDLES 31GX5/16") 31G X 8 MM MISC 1 Dose by Does not apply route 4 (four) times daily. Use 4 pen tips daily to inject insulin into the skin 360 each 1   . Insulin Syringe-Needle U-100 (INSULIN SYRINGE .3CC/31GX5/16") 31G X 5/16" 0.3 ML MISC 1  Syringe by Does not apply route 5 (five) times daily. 150 each 5     Review of Systems   All systems reviewed and negative except as stated in HPI  Blood pressure (!) 143/61, pulse (!) 102, resp. rate 18, SpO2 (!) 88 %, unknown if currently breastfeeding. General appearance: mild distress in setting of contractions, difficult to obtain history given intermittent responsiveness between contractions, able to provide her name s/p sternal rub HEENT: poor dentition Lungs: normal WOB Heart: mild tachypnea on arrival Abdomen: soft, non-tender Extremities:no sign of DVT Presentation: breech per cervical exam on arrival to L&D Fetal monitoring: unable to confirm fetal heart tones on arrival to MAU  Prenatal labs: ABO, Rh:  unknown (collected on admission) Antibody:   unknown (collected on admission) Rubella:    unknown  RPR:   (collected on admission) HBsAg:   (collected on admission) HIV:   (collected on admission) GBS:   unknown  Genetic screening unknown Anatomy US unknown  Prenatal Transfer Tool  Maternal Diabetes: Yes--poorly controlled Type 1 Diabetes Genetic Screening: unknown Maternal Ultrasounds/Referrals: reportedly normal per pt report Fetal Ultrasounds or other Referrals: no (limited prenatal care) Maternal Substance Abuse: none reported Significant Maternal Medications: short and long acting insulin Significant Maternal Lab Results: no lab history in chart  Results for orders placed or performed during the hospital encounter of 12/11/20 (from the past 24 hour(s))  Glucose, capillary   Collection Time: 12/11/20  6:32 AM  Result Value Ref Range   Glucose-Capillary 539 (HH) 70 - 99 mg/dL   Comment 1 Notify RN    Comment 2 Call MD NNP PA CNM    Comment 3 Document in Chart     Patient Active Problem List   Diagnosis Date Noted  . Indication for care in labor or delivery 12/11/2020  . Tobacco use 01/26/2020  . Dysuria 01/26/2020  . Type 1 diabetes mellitus (HCC) 06/30/2019  . Noncompliance with medication regimen   . DKA (diabetic ketoacidoses) 06/24/2019  . Post-operative state 07/06/2018  . PROM (premature rupture of membranes) 07/05/2018  . GBS (group b Streptococcus) UTI complicating pregnancy, unspecified trimester 06/10/2018  . Supervision of high risk pregnancy, antepartum, third trimester 05/27/2018  . Diabetes mellitus complicating pregnancy 05/27/2018  . GBS bacteriuria 05/27/2018    Assessment/Plan:  Megan Silva is a 25 y.o. female G2P1001 with IUP at approximately [redacted] weeks GA by second trimester ultrasound presenting for spontaneous labor s/p PPROM. Pt with history of uncontrolled Type 1 diabetes, found to be hyperglycemic to 539 on arrival, with overall presentation consistent with DKA.    #Preterm Labor s/p PPROM  H/o CS x1: On arrival to MAU with  EMS, pt actively pushing. Unable to confirm fetal heart tones in MAU and pt found to be grossly ruptured with copious amounts of amniotic fluid. Unable to obtain history on arrival given pt's altered mental status. L&D Resident vocera was promptly called by nursing and resident immediately called author who presented immediately to bedside in MAU. After brief evaluation of patient, Dr. Shawnie Pons was called in transit to L&D and presented to bedside immediately as well. On arrival to L&D, pt found to have complete cervical dilation with fetal parts in the vaginal vault. NICU called in preparation for delivery. Expectant management in preparation for vaginal delivery. #Pain: none given precipitous nature of delivery #FWB: Unable to obtain fetal heart tones on arrival. NICU present at bedside in prep for delivery.  #Hyperglycemia  Poorly Controlled Type 1 Diabetes: pt found to  have BG to 539 on arrival. Pt started on IVF bolus in addition to 10u regular insulin while awaiting initiation of Endotool. DKA labs obtained on admission. Unclear etiology of DKA on arrival given unable to discuss history with patient prior to delivery.  #Anemia: f/u CBC on admission. #Limited PNC: pt recently moved from [place]. No established care in [place]. Plan for SW consult s/p delivery.  Sheila Oats, MD OB Fellow, Faculty [facility] 12/11/2020 9:09 AM

## 2020-12-11 NOTE — Progress Notes (Signed)
Dr Nadara Eaton notified of pt's admission and status. Aware of EMS arrival, Ms [facility] on scene per EMS with visible bloody show on arrival, pt very uncomfortable, aware of elevated CBG on route per EMS. Will admit to [facility]

## 2020-12-11 NOTE — Progress Notes (Signed)
eLink Physician-Brief Progress Note Patient Name: Megan Silva DOB: [birth year] MRN: [id number]   Date of Service  12/11/2020  HPI/Events of Note  Bed side RN asking to [facility] Lispro SSI from [facility] and HS to q4 hrly and CBG q4 hr request from bed side RN.  Reviewed.   eICU Interventions  Changed accordingly.      Intervention Category Intermediate Interventions: Hyperglycemia - evaluation and treatment  Ranee Gosselin 12/11/2020, 9:34 PM

## 2020-12-11 NOTE — Progress Notes (Signed)
   12/11/20 0645  Clinical Encounter Type  Visited With Health care provider  Visit Type Critical Care (Baby in Critical Care)  Referral From Nurse  Consult/Referral To Chaplain  Chaplain responded to request from the Charge Nurse to check in with Ms. Sitts.  24week  precious one in critical care, mother did not want to see the chaplain but will pass this information onto day shift chaplain.  Chaplain Vincella Morgan-Simpson [phone]

## 2020-12-11 NOTE — Consult Note (Signed)
NAME:  KORALYN PRESTAGE, MRN:  [id number], DOB:  [birth year], LOS: 0 ADMISSION DATE:  12-16-2020, CONSULTATION DATE:  December 16, 2020 REFERRING MD:  Alysia Penna, CHIEF COMPLAINT:  N/V   History of Present Illness:  25 year old woman with hx of poorly controlled DM1 presenting with preterm spontaneous labor found to be in DKA.  Pertinent  Medical History  25 year old woman (G2P1001) with hx of poorly controlled DM1 presenting with preterm spontaneous labor at ~24weeks found to be in DKA.  Patient has been feeling N/V for past 4 days.  Poor PO.  Hospitalized about once a year for uncontrolled blood sugars.  Normally sugars run in 200s per her report.  She is recently moved from [place].  Claims compliance with home insulin regimen but does not remember dosing.  Unfortunately, fetus did not survive.  Feels better with LR bolus x 2 and insulin gtt.  PCCM asked to help manage DKA.  Significant Hospital Events: Including procedures, antibiotic start and stop dates in addition to other pertinent events   . 12-17-2022: arrived, dead fetus delivered, sent to 72M  Interim History / Subjective:  As above  Objective   Blood pressure 133/72, pulse 84, temperature (!) 97.2 F (36.2 C), temperature source Axillary, resp. rate (!) 22, SpO2 100 %, unknown if currently breastfeeding.    FiO2 (%):  [31 %] 31 %   Intake/Output Summary (Last 24 hours) at 2020/12/16 1044 Last data filed at 12/16/20 0857 Gross per 24 hour  Intake --  Output 600 ml  Net -600 ml   There were no vitals filed for this visit.  Examination: General: thin woman in NAD HENT: MM dry, trachea midline Lungs: slightly tachypneic, lungs clear Cardiovascular: RRR, ext warm Abdomen: soft, defer fundal exam to OB Extremities: no edema Neuro: moves all 4 ext to command Psych: flat affect, AOx3  Labs/imaging that I havepersonally reviewed  (right click and "Reselect all SmartList Selections" daily)  AKI Pseudohyponatremia Anion gap  acidosis Sugar 600 Mild white count ABG pH 7.1  Resolved Hospital Problem list   n/a  Assessment & Plan:  DKA What sounds like very uncontrolled baseline DM1 Postpartum with fetal loss Protein calorie malnutrition  - DKA protocol, can eat once gap closes, switch to basal bolus at that time as well - Generous IVF with potassium PRN  - Fundal checks per OB  Best practice (right click and "Reselect all SmartList Selections" daily)  Diet:  NPO Pain/Anxiety/Delirium protocol (if indicated): No VAP protocol (if indicated): Not indicated DVT prophylaxis: SCD GI prophylaxis: N/A Glucose control:  Insulin gtt Central venous access:  N/A Arterial line:  N/A Foley:  N/A Mobility:  bed rest  PT consulted: N/A Last date of multidisciplinary goals of care discussion [N/A] Code Status:  full code Disposition: MICU pending DKA resolved  Labs   CBC: Recent Labs  Lab 16-Dec-2020 0659  WBC 19.5*  HGB 12.5  HCT 41.1  MCV 79.5*  PLT 527*    Basic Metabolic Panel: Recent Labs  Lab 12/16/2020 0659  NA 128*  K 4.7  CL 98  CO2 <7*  GLUCOSE 598*  BUN 17  CREATININE 1.57*  CALCIUM 9.2   GFR: CrCl cannot be calculated (Unknown ideal weight.). Recent Labs  Lab Dec 16, 2020 0659 Dec 16, 2020 0835 12/16/2020 0929  WBC 19.5*  --   --   LATICACIDVEN 3.1* 2.7* 3.3*    Liver Function Tests: Recent Labs  Lab 12-16-2020 0659  AST 20  ALT 13  ALKPHOS  92  BILITOT 2.0*  PROT 8.2*  ALBUMIN 3.6   No results for input(s): LIPASE, AMYLASE in the last 168 hours. No results for input(s): AMMONIA in the last 168 hours.  ABG    Component Value Date/Time   HCO3 12.0 (L) 06/24/2019 2055   TCO2 13 (L) 06/24/2019 2055   ACIDBASEDEF 13.0 (H) 06/24/2019 2055   O2SAT 79.0 06/24/2019 2055     Coagulation Profile: No results for input(s): INR, PROTIME in the last 168 hours.  Cardiac Enzymes: No results for input(s): CKTOTAL, CKMB, CKMBINDEX, TROPONINI in the last 168 hours.  HbA1C: Hgb A1c  MFr Bld  Date/Time Value Ref Range Status  01/26/2020 02:30 PM >15.5 (H) 4.8 - 5.6 % Final    Comment:    **Verified by repeat analysis**          Prediabetes: 5.7 - 6.4          Diabetes: >6.4          Glycemic control for adults with diabetes: <7.0   06/25/2019 07:09 AM 15.9 (H) 4.8 - 5.6 % Final    Comment:    (NOTE) Pre diabetes:          5.7%-6.4% Diabetes:              >6.4% Glycemic control for   <7.0% adults with diabetes     CBG: Recent Labs  Lab 12/11/20 0632 12/11/20 0803 12/11/20 0845 12/11/20 0955  GLUCAP 539* 520* 391* 246*    Review of Systems:    Positive Symptoms in bold:  Constitutional fevers, chills, weight loss, fatigue, anorexia, malaise  Eyes decreased vision, double vision, eye irritation  Ears, Nose, Mouth, Throat sore throat, trouble swallowing, sinus congestion  Respiratory SOB, cough, DOE, hemoptysis, wheezing  Gastrointestinal nausea, vomiting, diarrhea  Genitourinary burning with urination, trouble urinating  Musculoskeletal joint aches, joint swelling, back pain  Integumentary  rashes, skin lesions  Neurological focal weakness, focal numbness, trouble speaking, headaches  Psychiatric depression, anxiety, confusion  Endocrine polyuria, polydipsia, cold intolerance, heat intolerance  Hematologic abnormal bruising, abnormal bleeding, unexplained nose bleeds  Allergic/Immunologic recurrent infections, hives, swollen lymph nodes     Past Medical History:  She,  has a past medical history of Diabetes type 1, controlled (HCC).   Surgical History:   Past Surgical History:  Procedure Laterality Date  . CESAREAN SECTION N/A 07/06/2018   Procedure: CESAREAN SECTION;  Surgeon: Hermina Staggers, MD;  Location: [facility] BIRTHING SUITES;  Service: Obstetrics;  Laterality: N/A;  . WISDOM TOOTH EXTRACTION       Social History:   reports that she has never smoked. She has never used smokeless tobacco. She reports current alcohol use. She reports  that she does not use drugs.   Family History:  Her family history includes Cancer in her paternal grandmother; Diabetes in her maternal grandmother.   Allergies No Known Allergies   Home Medications  Prior to Admission medications   Medication Sig Start Date End Date Taking? Authorizing Provider  Insulin Glargine (LANTUS) 100 UNIT/ML Solostar Pen Inject 25 Units into the skin at bedtime. 09/04/19   Katherine Roan, MD  insulin lispro (HUMALOG KWIKPEN) 100 UNIT/ML KwikPen Inject 0.05 mLs (5 Units total) into the skin 3 (three) times daily with meals. 09/04/19   Katherine Roan, MD  Insulin Pen Needle (PEN NEEDLES 31GX5/16") 31G X 8 MM MISC 1 Dose by Does not apply route 4 (four) times daily. Use 4 pen tips daily to inject insulin into the  skin 03/03/20   Katherine Roan, MD  Insulin Syringe-Needle U-100 (INSULIN SYRINGE .3CC/31GX5/16") 31G X 5/16" 0.3 ML MISC 1 Syringe by Does not apply route 5 (five) times daily. 09/02/19   Katherine Roan, MD      Patient critically ill due to DKA Interventions to address this today insulin, fluids Risk of deterioration without these interventions is high  I personally spent 34 minutes providing critical care not including any separately billable procedures  Myrla Halsted MD [place]  Prefer epic messenger for cross cover needs If after hours, please call E-link

## 2020-12-11 NOTE — Progress Notes (Signed)
Lab called and stated that pt is + for antibodies. They have 2RBCs ready if necessary. Nettie Elm, MD notified and aware. No new orders at this time.

## 2020-12-12 ENCOUNTER — Encounter (HOSPITAL_COMMUNITY): Payer: Self-pay

## 2020-12-12 DIAGNOSIS — E1010 Type 1 diabetes mellitus with ketoacidosis without coma: Secondary | ICD-10-CM | POA: Diagnosis not present

## 2020-12-12 DIAGNOSIS — O2403 Pre-existing diabetes mellitus, type 1, in the puerperium: Secondary | ICD-10-CM

## 2020-12-12 LAB — BASIC METABOLIC PANEL
Anion gap: 7 (ref 5–15)
Anion gap: 7 (ref 5–15)
BUN: 10 mg/dL (ref 6–20)
BUN: 7 mg/dL (ref 6–20)
CO2: 17 mmol/L — ABNORMAL LOW (ref 22–32)
CO2: 20 mmol/L — ABNORMAL LOW (ref 22–32)
Calcium: 8.5 mg/dL — ABNORMAL LOW (ref 8.9–10.3)
Calcium: 8.6 mg/dL — ABNORMAL LOW (ref 8.9–10.3)
Chloride: 111 mmol/L (ref 98–111)
Chloride: 106 mmol/L (ref 98–111)
Creatinine, Ser: 0.58 mg/dL (ref 0.44–1.00)
Creatinine, Ser: 0.7 mg/dL (ref 0.44–1.00)
GFR, Estimated: >60 mL/min (ref >60)
GFR, Estimated: >60 mL/min (ref >60)
Glucose, Bld: 96 mg/dL (ref 70–99)
Glucose, Bld: 141 mg/dL — ABNORMAL HIGH (ref 70–99)
Potassium: 3.1 mmol/L — ABNORMAL LOW (ref 3.5–5.1)
Potassium: 3.4 mmol/L — ABNORMAL LOW (ref 3.5–5.1)
Sodium: 135 mmol/L (ref 135–145)
Sodium: 133 mmol/L — ABNORMAL LOW (ref 135–145)

## 2020-12-12 LAB — BLOOD GAS, VENOUS
Acid-base deficit: 5.8 mmol/L — ABNORMAL HIGH (ref 0.0–2.0)
Bicarbonate: 18.5 mmol/L — ABNORMAL LOW (ref 20.0–28.0)
Drawn by: 3238
FIO2: 21
O2 Saturation: 82.4 %
Patient temperature: 37
pCO2, Ven: 32.9 mmHg — ABNORMAL LOW (ref 44.0–60.0)
pH, Ven: 7.369 (ref 7.250–7.430)
pO2, Ven: 47.1 mmHg — ABNORMAL HIGH (ref 32.0–45.0)

## 2020-12-12 LAB — RESP PANEL BY RT-PCR (FLU A&B, COVID) ARPGX2
Influenza A by PCR: NEGATIVE
Influenza B by PCR: NEGATIVE
SARS Coronavirus 2 by RT PCR: NEGATIVE

## 2020-12-12 LAB — GLUCOSE, CAPILLARY
Glucose-Capillary: 117 mg/dL — ABNORMAL HIGH (ref 70–99)
Glucose-Capillary: 95 mg/dL (ref 70–99)
Glucose-Capillary: 301 mg/dL — ABNORMAL HIGH (ref 70–99)
Glucose-Capillary: 146 mg/dL — ABNORMAL HIGH (ref 70–99)
Glucose-Capillary: 125 mg/dL — ABNORMAL HIGH (ref 70–99)
Glucose-Capillary: 206 mg/dL — ABNORMAL HIGH (ref 70–99)

## 2020-12-12 LAB — BETA-HYDROXYBUTYRIC ACID: Beta-Hydroxybutyric Acid: 0.32 mmol/L — ABNORMAL HIGH (ref 0.05–0.27)

## 2020-12-12 MED ORDER — INSULIN ASPART 100 UNIT/ML ~~LOC~~ SOLN
0.0000 [IU] | Freq: Three times a day (TID) | SUBCUTANEOUS | Status: DC
  Administered 2020-12-12: 2 [IU] via SUBCUTANEOUS
  Administered 2020-12-13 (×2): 3 [IU] via SUBCUTANEOUS
  Administered 2020-12-13: 5 [IU] via SUBCUTANEOUS
  Administered 2020-12-14: 3 [IU] via SUBCUTANEOUS

## 2020-12-12 MED ORDER — INSULIN ASPART 100 UNIT/ML ~~LOC~~ SOLN
0.0000 [IU] | Freq: Every day | SUBCUTANEOUS | Status: DC
  Administered 2020-12-12: 2 [IU] via SUBCUTANEOUS

## 2020-12-12 MED ORDER — POTASSIUM CHLORIDE CRYS ER 20 MEQ PO TBCR
40.0000 meq | EXTENDED_RELEASE_TABLET | Freq: Once | ORAL | Status: AC
  Administered 2020-12-12: 40 meq via ORAL
  Filled 2020-12-12: qty 2

## 2020-12-12 NOTE — Progress Notes (Addendum)
NAME:  Megan Silva, MRN:  [id number], DOB:  [birth year], LOS: 1 ADMISSION DATE:  01-10-2021, CONSULTATION DATE:  01/10/2021 REFERRING MD:  Alysia Penna, CHIEF COMPLAINT:  N/V   History of Present Illness:  25 year old woman with hx of poorly controlled DM1 presenting with preterm spontaneous labor found to be in DKA.  Pertinent  Medical History  25 year old woman (G2P1001) with hx of poorly controlled DM1 presenting with preterm spontaneous labor at ~24weeks found to be in DKA.  Patient has been feeling N/V for past 4 days.  Poor PO.  Hospitalized about once a year for uncontrolled blood sugars.  Normally sugars run in 200s per her report.  She is recently moved from [place].  Claims compliance with home insulin regimen but does not remember dosing.  Unfortunately, fetus did not survive.  Significant Hospital Events: Including procedures, antibiotic start and stop dates in addition to other pertinent events   . 01/11/2023 Arrived, dead fetus delivered, sent to 55M . 3/27 AG closed, labs improved, off insulin gtt.  Tolerating PO's.  Interim History / Subjective:  Afebrile  Glucose range 95 - 271 AG closed, beta-hydroxy improved  Transitioned off insulin gtt  Tolerating diet   Objective   Blood pressure 100/64, pulse 93, temperature 98.4 F (36.9 C), temperature source Oral, resp. rate 15, height 5\' 7"  (1.702 m), SpO2 100 %, unknown if currently breastfeeding.        Intake/Output Summary (Last 24 hours) at 12/12/2020 1106 Last data filed at 12/12/2020 0900 Gross per 24 hour  Intake 2591.43 ml  Output --  Net 2591.43 ml   There were no vitals filed for this visit.  Examination: General: young adult female lying in bed in NAD HEENT: MM pink/moist, anicteric Neuro: AAOx4, speech clear, MAE CV: s1s2 RRR, no m/r/g PULM: non-labored on RA, lungs bilaterally clear  GI: soft, bsx4 active, tolerating PO's GYN: defer fundal exam to OB Extremities: warm/dry, no edema  Skin: no rashes or  lesions PSY: flat affect      Resolved Hospital Problem list   AGMA  Assessment & Plan:   DKA Uncontrolled DM I  -transitioned to SSI, moderate scale  -Lantus 25 units QD  -stop IVF as is tolerating diet / adequate liquid intake -recommend DM Coordinator assessment and education prior to discharge  -follow glucose closely  -stop further VBG's, Q4 BMP  -follow up BMP at 1800 & in am   Pseudohyponatremia  -improving, follow Na+ trend  AKI -resolving, follow trend  Leukocytosis  -follow, monitor for evidence of infection / retained tissue   Postpartum with Fetal Loss -per OB/GYN  Protein calorie malnutrition -Nutrition as tolerated   Best practice (right click and "Reselect all SmartList Selections" daily)  Diet:  Oral Pain/Anxiety/Delirium protocol (if indicated): No VAP protocol (if indicated): Not indicated DVT prophylaxis: SCD GI prophylaxis: N/A Glucose control:  SSI Yes and Basal insulin Yes Central venous access:  N/A Arterial line:  N/A Foley:  N/A Mobility:  bed rest  PT consulted: N/A Last date of multidisciplinary goals of care discussion [N/A] Code Status:  full code Disposition: ok to transfer back to [facility]  Labs   CBC: Recent Labs  Lab 01-10-2021 0659  WBC 19.5*  HGB 12.5  HCT 41.1  MCV 79.5*  PLT 527*    Basic Metabolic Panel: Recent Labs  Lab 2021-01-10 0659 2021-01-10 1114 Jan 10, 2021 1531 2021-01-10 1637 12/12/20 0711  NA 128* 136 134* 133* 135  K 4.7 3.6  4.4 4.3 3.1*  CL 98 110 110 110 111  CO2 <7* 12* 15* 13* 17*  GLUCOSE 598* 108* 150* 166* 96  BUN 17 11 8 8 10   CREATININE 1.57* 0.94 0.69 0.70 0.58  CALCIUM 9.2 8.5* 8.4* 8.3* 8.5*   GFR: CrCl cannot be calculated (Unknown ideal weight.). Recent Labs  Lab 12/11/20 0659 12/11/20 0835 12/11/20 0929 12/11/20 1114 12/11/20 1531  WBC 19.5*  --   --   --   --   LATICACIDVEN 3.1* 2.7* 3.3* 1.9 1.3    Liver Function Tests: Recent Labs  Lab 12/11/20 0659  AST 20   ALT 13  ALKPHOS 92  BILITOT 2.0*  PROT 8.2*  ALBUMIN 3.6   No results for input(s): LIPASE, AMYLASE in the last 168 hours. No results for input(s): AMMONIA in the last 168 hours.  ABG    Component Value Date/Time   HCO3 18.5 (L) 12/12/2020 0711   TCO2 13 (L) 06/24/2019 2055   ACIDBASEDEF 5.8 (H) 12/12/2020 0711   O2SAT 82.4 12/12/2020 0711     Coagulation Profile: No results for input(s): INR, PROTIME in the last 168 hours.  Cardiac Enzymes: No results for input(s): CKTOTAL, CKMB, CKMBINDEX, TROPONINI in the last 168 hours.  HbA1C: Hgb A1c MFr Bld  Date/Time Value Ref Range Status  12/11/2020 11:14 AM 13.1 (H) 4.8 - 5.6 % Final    Comment:    (NOTE) Pre diabetes:          5.7%-6.4%  Diabetes:              >6.4%  Glycemic control for   <7.0% adults with diabetes   01/26/2020 02:30 PM >15.5 (H) 4.8 - 5.6 % Final    Comment:    **Verified by repeat analysis**          Prediabetes: 5.7 - 6.4          Diabetes: >6.4          Glycemic control for adults with diabetes: <7.0     CBG: Recent Labs  Lab 12/11/20 2028 12/11/20 2132 12/11/20 2303 12/12/20 0312 12/12/20 0747  GLUCAP 282* 271* 245* 117* 95    Critical Care Time:     12/14/20, MSN, APRN, NP-C, AGACNP-BC [facility] 12/12/2020, 11:06 AM   Please see [email or web address] for pager details.   From 7A-7P if no response, please call [phone] After hours, please call ELink [phone]

## 2020-12-12 NOTE — Progress Notes (Signed)
Post Partum Day 1 Subjective: no complaints and tolerating PO  Objective: Blood pressure 123/81, pulse 90, temperature 98.6 F (37 C), temperature source Oral, resp. rate 17, height 5\' 7"  (1.702 m), SpO2 100 %, unknown if currently breastfeeding.  Physical Exam:  General: alert, cooperative and no distress Lochia: appropriate Uterine Fundus: firm DVT Evaluation: No evidence of DVT seen on physical exam.  Recent Labs    12/11/20 0659  HGB 12.5  HCT 41.1   BMP Latest Ref Rng & Units 12/12/2020 12/11/2020 12/11/2020  Glucose 70 - 99 mg/dL 96 12/13/2020) 706(C)  BUN 6 - 20 mg/dL 10 8 8   Creatinine 0.44 - 1.00 mg/dL 376(E 8.31  BUN/Creat Ratio 9 - 23 - - -  Sodium 135 - 145 mmol/L 135 133(L) 134(L)  Potassium 3.5 - 5.1 mmol/L 3.1(L) 4.3 4.4  Chloride 98 - 111 mmol/L 111 110 110  CO2 22 - 32 mmol/L 17(L) 13(L) 15(L)  Calcium 8.9 - 10.3 mg/dL 5.17) 8.3(L) 8.4(L)   CBG (last 3)  Recent Labs    12/12/20 0747 12/12/20 1145 12/12/20 1356  GLUCAP 95 301* 146*     Assessment/Plan: Glucose management PP   LOS: 1 day   12/14/20 12/12/2020, 4:39 PM

## 2020-12-12 NOTE — Progress Notes (Signed)
Pt refused VBG and morning labs at 0300. Was still able to get CBG with finger prick. Educated and lab will attempt again at 0700 for both VBG and morning labs missed.

## 2020-12-12 NOTE — Progress Notes (Signed)
OB Rapid Response RN Yehuda Mao came to check on pt. Stated fundus is in correct position. Lochia is correct color and amount of bleeding is normal. Will continue to assess. Per OB RR, patient only needs to be checked Q Shift for fundal massage and hemorrhaging. Will continue to monitor for excess bleeding and any altered mental status.

## 2020-12-12 NOTE — Progress Notes (Addendum)
Inpatient Diabetes Program Recommendations  AACE/ADA: New Consensus Statement on Inpatient Glycemic Control (2015)  Target Ranges:  Prepandial:   less than 140 mg/dL      Peak postprandial:   less than 180 mg/dL (1-2 hours)      Critically ill patients:  140 - 180 mg/dL   Lab Results  Component Value Date   GLUCAP 146 (H) 12/12/2020   HGBA1C 13.1 (H) 12/11/2020    Review of Glycemic Control Results for Megan Silva, Megan Silva (MRN [id number]) as of 12/12/2020 14:11  Ref. Range 12/12/2020 07:47 12/12/2020 11:45 12/12/2020 13:56  Glucose-Capillary Latest Ref Range: 70 - 99 mg/dL 95 462 (H)  Novolog 11 units given 146 (H)   Diabetes history: DM type 1 Outpatient Diabetes medications:  In the past has been on Lantus 15 units bid, Humalog 4 units tid meal coverage Crrent orders for Inpatient glycemic control: Novolog 0-15 units Q4  Lantus 25 units  Call from RN for adjustments to regimen after transfer from [facility] ICU.  MD wants ACHS before meals.  -  I would also recommend 3 units Novolog tid meal coverage in addition to the correction scale if pt is eating >50% of meals to prevent hyperglycemia after meal intake.  Orders adjusted.  Thanks,  Christena Deem RN, MSN, BC-ADM Inpatient Diabetes Coordinator Team Pager [phone] (8a-5p)

## 2020-12-13 ENCOUNTER — Telehealth: Payer: Self-pay

## 2020-12-13 DIAGNOSIS — E1010 Type 1 diabetes mellitus with ketoacidosis without coma: Secondary | ICD-10-CM | POA: Diagnosis not present

## 2020-12-13 DIAGNOSIS — O2403 Pre-existing diabetes mellitus, type 1, in the puerperium: Secondary | ICD-10-CM | POA: Diagnosis not present

## 2020-12-13 LAB — BASIC METABOLIC PANEL
Anion gap: 6 (ref 5–15)
BUN: 7 mg/dL (ref 6–20)
CO2: 23 mmol/L (ref 22–32)
Calcium: 8.4 mg/dL — ABNORMAL LOW (ref 8.9–10.3)
Chloride: 108 mmol/L (ref 98–111)
Creatinine, Ser: 0.72 mg/dL (ref 0.44–1.00)
GFR, Estimated: >60 mL/min (ref >60)
Glucose, Bld: 209 mg/dL — ABNORMAL HIGH (ref 70–99)
Potassium: 3.3 mmol/L — ABNORMAL LOW (ref 3.5–5.1)
Sodium: 137 mmol/L (ref 135–145)

## 2020-12-13 LAB — CBC
HCT: 33 % — ABNORMAL LOW (ref 36.0–46.0)
Hemoglobin: 10.5 g/dL — ABNORMAL LOW (ref 12.0–15.0)
MCH: 24.4 pg — ABNORMAL LOW (ref 26.0–34.0)
MCHC: 31.8 g/dL (ref 30.0–36.0)
MCV: 76.7 fL — ABNORMAL LOW (ref 80.0–100.0)
Platelets: 310 10*3/uL (ref 150–400)
RBC: 4.3 MIL/uL (ref 3.87–5.11)
RDW: 14.6 % (ref 11.5–15.5)
WBC: 11.9 10*3/uL — ABNORMAL HIGH (ref 4.0–10.5)
nRBC: 0 % (ref 0.0–0.2)

## 2020-12-13 LAB — GLUCOSE, CAPILLARY
Glucose-Capillary: 175 mg/dL — ABNORMAL HIGH (ref 70–99)
Glucose-Capillary: 232 mg/dL — ABNORMAL HIGH (ref 70–99)
Glucose-Capillary: 158 mg/dL — ABNORMAL HIGH (ref 70–99)
Glucose-Capillary: 132 mg/dL — ABNORMAL HIGH (ref 70–99)

## 2020-12-13 MED ORDER — KETOROLAC TROMETHAMINE 30 MG/ML IJ SOLN
30.0000 mg | Freq: Once | INTRAMUSCULAR | Status: DC
  Filled 2020-12-13: qty 1

## 2020-12-13 NOTE — Care Management Note (Addendum)
Case Management Note  Patient Details  Name: Megan Silva MRN: [id number] Date of Birth: Apr 06, 1996  Subjective/Objective:                   Megan Silva is a 25 y.o. G2P1101 s/p NSVD of fetal demise    In-House Referral:  Social Worker  Discharge planning Services  CM Consult- PCP appt.  Additional Comments: CM received call from Diabetes Coordinator requesting an appointment for PCP for patient. Patient had seen [facility] Medicine [facility] at [facility] in the past and would like to continue seeing them.  CM called # [phone] and spoke to Dewayne Hatch and she gave patient appointment Tuesday April 4th Tuesday at 3:15 pm (arrive at 3:00 ) with Dr. Richardson Dopp.  Patient's corrected phone number is: [phone] - given to Ann at the clinic.   CM called financial counselor at [facility]- Selina Cooley to verify insurance before discharge.  She verified that patient's has active [facility].  Patient should have no barriers with obtaining medications(active [place] [employer].  CM spoke to Alinda Money in [facility] and he plans to fill prescriptions if discharged during their working hours, if not patient goes to [facility].  CM made RN on unit aware. No other needs identified by the patient and patient verbalized understanding.   Geoffery Lyons, RN 12/13/2020, 1:32 PM

## 2020-12-13 NOTE — Progress Notes (Signed)
CSW acknowledged consult and attempted to meet with patient; however, patient was in the shower. CSW will attempt to meet with patient at a later time.   Celso Sickle, LCSW Clinical Social Worker [facility] Cell#: [phone]

## 2020-12-13 NOTE — Progress Notes (Signed)
Pt refused lab draw this AM. Phlebotomy will attempt again at later time.

## 2020-12-13 NOTE — Progress Notes (Signed)
POSTPARTUM PROGRESS NOTE  PPD #1   Subjective:  Megan Silva is a 25 y.o. G2P1101 s/p NSVD of fetal demise. Today she notes no acute complaints. She denies any problems with ambulating, voiding or po intake. Denies nausea or vomiting. She has + flatus, +BM.  Pain is tolerable.  Lochia minimal Denies fever/chills/chest pain/SOB.  no HA, no blurry vision, noRUQ pain  Objective: Blood pressure 124/77, pulse 88, temperature 98.2 F (36.8 C), temperature source Oral, resp. rate 18, height 5\' 7"  (1.702 m), SpO2 100 %, unknown if currently breastfeeding.  Physical Exam:  General: alert, cooperative and no distress Chest: no respiratory distress, CTAB Heart:RRR Abdomen: soft, nontender, +BS Uterine Fundus: firm, appropriately tender DVT Evaluation: No calf swelling or tenderness Extremities: no edema Skin: warm, dry Psych: appropriate mood and affect  Results for orders placed or performed during the hospital encounter of 12/11/20 (from the past 24 hour(s))  Glucose, capillary     Status: Abnormal   Collection Time: 12/12/20 11:45 AM  Result Value Ref Range   Glucose-Capillary 301 (H) 70 - 99 mg/dL  Glucose, capillary     Status: Abnormal   Collection Time: 12/12/20  1:56 PM  Result Value Ref Range   Glucose-Capillary 146 (H) 70 - 99 mg/dL  Basic metabolic panel     Status: Abnormal   Collection Time: 12/12/20  5:57 PM  Result Value Ref Range   Sodium 133 (L) 135 - 145 mmol/L   Potassium 3.4 (L) 3.5 - 5.1 mmol/L   Chloride 106 98 - 111 mmol/L   CO2 20 (L) 22 - 32 mmol/L   Glucose, Bld 141 (H) 70 - 99 mg/dL   BUN 7 6 - 20 mg/dL   Creatinine, Ser 12/14/20 0.44 - 1.00 mg/dL   Calcium 8.6 (L) 8.9 - 10.3 mg/dL   GFR, Estimated 2.54 >27 mL/min   Anion gap 7 5 - 15  Glucose, capillary     Status: Abnormal   Collection Time: 12/12/20  5:58 PM  Result Value Ref Range   Glucose-Capillary 125 (H) 70 - 99 mg/dL  Resp Panel by RT-PCR (Flu A&B, Covid) Nasopharyngeal Swab     Status: None    Collection Time: 12/12/20  8:29 PM   Specimen: Nasopharyngeal Swab; Nasopharyngeal(NP) swabs in vial transport medium  Result Value Ref Range   SARS Coronavirus 2 by RT PCR NEGATIVE NEGATIVE   Influenza A by PCR NEGATIVE NEGATIVE   Influenza B by PCR NEGATIVE NEGATIVE  Glucose, capillary     Status: Abnormal   Collection Time: 12/12/20 10:07 PM  Result Value Ref Range   Glucose-Capillary 206 (H) 70 - 99 mg/dL  Glucose, capillary     Status: Abnormal   Collection Time: 12/13/20  7:32 AM  Result Value Ref Range   Glucose-Capillary 175 (H) 70 - 99 mg/dL  Basic metabolic panel     Status: Abnormal   Collection Time: 12/13/20  7:33 AM  Result Value Ref Range   Sodium 137 135 - 145 mmol/L   Potassium 3.3 (L) 3.5 - 5.1 mmol/L   Chloride 108 98 - 111 mmol/L   CO2 23 22 - 32 mmol/L   Glucose, Bld 209 (H) 70 - 99 mg/dL   BUN 7 6 - 20 mg/dL   Creatinine, Ser 12/15/20 0.44 - 1.00 mg/dL   Calcium 8.4 (L) 8.9 - 10.3 mg/dL   GFR, Estimated 2.37 >62 mL/min   Anion gap 6 5 - 15  CBC     Status:  Abnormal   Collection Time: 12/13/20  7:33 AM  Result Value Ref Range   WBC 11.9 (H) 4.0 - 10.5 K/uL   RBC 4.30 3.87 - 5.11 MIL/uL   Hemoglobin 10.5 (L) 12.0 - 15.0 g/dL   HCT 79.1 (L) 50.5 - 69.7 %   MCV 76.7 (L) 80.0 - 100.0 fL   MCH 24.4 (L) 26.0 - 34.0 pg   MCHC 31.8 30.0 - 36.0 g/dL   RDW 94.8 01.6 - 55.3 %   Platelets 310 150 - 400 K/uL   nRBC 0.0 0.0 - 0.2 %    Assessment/Plan: Megan Silva is a 25 y.o. [id number] s/p VD with fetal demise PPD#1 complicated by: 1) Type 1 DM with DKA -sugars as above, significantly improved Current meds: Lantus 25 units daily in am, sliding scale novolog with meals -DM coordinator planned to help with transition to outpatient medication.  Pt does not have PCP/endocrinologist moved from [place] ~ 1wk ago -continue diabetic diet  2) Fetal demise Mood appropriate, social work consult pending  Dispo: Meeting postpartum milestones appropriately, continue care as  outlined above   LOS: 2 days   Myna Hidalgo, DO Faculty Attending, [facility] [employer] 12/13/2020, 8:50 AM

## 2020-12-13 NOTE — Telephone Encounter (Signed)
RN lanette from women call to set up and hospital f/u for pt who will be discharge either today or tomorrow .. pt was   DKA upon admit 539 was bs .

## 2020-12-13 NOTE — Progress Notes (Signed)
CSW acknowledged consult and met with MOB at bedside, FOB present. CSW introduced self and inquired about how MOB was doing, MOB reported that she was doing okay. CSW offered condolences to parents and asked about their baby. Parents reported that they had a daughter and named her D[place]. CSW asked if parents were interested in therapy resources, MOB reported that she was unsure at this time. CSW provided MOB with CSW contact information and encouraged MOB to contact CSW if any needs arise or if she is interested in therapy resources in the future. CSW asked if their was anything that CSW could do to be helpful, parents reported no needs. Parents thanked CSW for visit. Parents presented calm and were receptive to [place] visit.   Abundio Miu, [place] Worker [facility] Cell#: [phone]

## 2020-12-13 NOTE — Progress Notes (Addendum)
Inpatient Diabetes Program Recommendations  AACE/ADA: New Consensus Statement on Inpatient Glycemic Control (2015)  Target Ranges:  Prepandial:   less than 140 mg/dL      Peak postprandial:   less than 180 mg/dL (1-2 hours)      Critically ill patients:  140 - 180 mg/dL   Lab Results  Component Value Date   GLUCAP 175 (H) 12/13/2020   HGBA1C 13.1 (H) 12/11/2020    Review of Glycemic Control   Diabetes history: DM type 1 Outpatient Diabetes medications:  In the past has been on Lantus 15 units bid, Humalog 4 units tid meal coverage Crrent orders for Inpatient glycemic control:  Novolog 0-15 units tod + hs Lantus 25 units  - May need addition of Novolog meal coverage  Needs PCP follow up. Will see pt today to coordinate discharge planning outpatient. Also need to go over glucose control outpatient for pregnancy and for sick days to still take insulin.  Spoke with patient at bedside regarding glucose control at home pre/during/post pregnancy. Pt reports having a 16 year old son at home and delivered a baby girl at the hospital on Saturday that did not make it. Discussed the DKA and acidosis on the body. Discussed taking insulin when feeling sick. By pt report, glucose was in the lower 200 range at home all the time. Pt reports her Endocrinologist and doctors were not as good at scheduling follow up as in her previous pregnancy relating to baby visits and glucose control.  Pt was on basal insulin and a set meal coverage of short acting insulin prior to pregnancy. She can't remember specific doses. Pt had [facility] a [facility] back in May last year and does not mind to continue to see PCP there.  D/c insulin dosing at time of d/c: Lantus 25 units Daily Novolog 4 units tid meal coverage before meals  Insurance [place] medicaid should cover insulins: - Lantus solostar insulin pen order # [id number] - Novolog Flexpen order # [id number] - Insulin pen needles order #  [id number] - Generic test strips   order #[id number] - Generic lancets order # [id number]  Julieanne Cotton, NCM who will call [facility] at [facility] for PCP follow up (Pt had seen MD there 01/2020), Follow up within 1 week.  Thanks,  Christena Deem RN, MSN, BC-ADM Inpatient Diabetes Coordinator Team Pager [phone] (8a-5p)

## 2020-12-13 NOTE — Lactation Note (Signed)
Lactation Consultation Note  Patient Name: Megan Silva Date: 12/13/2020   Age:25 y.o.   RN told LC that mom was with her baby.  LC provided RN with WIC after a loss information and explained the postpartum 6 month benefit to RN.  RN will notify lactation if mom desires or if RN feels a visit is needed      Maternal Data    Feeding    LATCH Score                    Lactation Tools Discussed/Used    Interventions    Discharge    Consult Status      [name] [facility] 12/13/2020, 7:36 PM

## 2020-12-14 ENCOUNTER — Other Ambulatory Visit: Payer: Self-pay | Admitting: Obstetrics & Gynecology

## 2020-12-14 DIAGNOSIS — E1010 Type 1 diabetes mellitus with ketoacidosis without coma: Secondary | ICD-10-CM | POA: Diagnosis not present

## 2020-12-14 DIAGNOSIS — O2403 Pre-existing diabetes mellitus, type 1, in the puerperium: Secondary | ICD-10-CM | POA: Diagnosis not present

## 2020-12-14 LAB — GLUCOSE, CAPILLARY: Glucose-Capillary: 195 mg/dL — ABNORMAL HIGH (ref 70–99)

## 2020-12-14 LAB — SURGICAL PATHOLOGY

## 2020-12-14 MED ORDER — IBUPROFEN 600 MG PO TABS: 600.0000 mg | ORAL_TABLET | Freq: Four times a day (QID) | ORAL | 0 refills | Status: DC | PRN

## 2020-12-14 MED ORDER — "PEN NEEDLES 5/16"" 31G X 8 MM MISC": 1.0000 | Freq: Four times a day (QID) | 1 refills | Status: DC

## 2020-12-14 MED ORDER — INSULIN PEN NEEDLE 32G X 4 MM MISC: 11 refills | Status: DC

## 2020-12-14 MED ORDER — INSULIN GLARGINE 100 UNIT/ML SOLOSTAR PEN: 25.0000 [IU] | PEN_INJECTOR | Freq: Every day | SUBCUTANEOUS | 11 refills | Status: DC

## 2020-12-14 MED ORDER — BLOOD GLUC METER DISP-STRIPS DEVI: 11 refills | Status: DC

## 2020-12-14 MED ORDER — INSULIN ASPART 100 UNIT/ML FLEXPEN: 4.0000 [IU] | PEN_INJECTOR | Freq: Three times a day (TID) | SUBCUTANEOUS | 11 refills | Status: DC

## 2020-12-14 MED ORDER — LANCETS MISC. MISC: 11 refills | Status: DC

## 2020-12-14 MED FILL — PENTIPS 31G X 8 MM MISC: 31G X 8 MM | 25 days supply | Qty: 100 | Fill #0

## 2020-12-14 MED FILL — ACCU-CHEK SOFTCLIX LANCETS: 25 days supply | Qty: 100 | Fill #0

## 2020-12-14 MED FILL — IBUPROFEN 600 MG TABLET: 600 | 7 days supply | Qty: 30 | Fill #0

## 2020-12-14 MED FILL — ACCU-CHEK GUIDE W/DEVICE KI: W/DEVICE | 1 days supply | Qty: 1 | Fill #0

## 2020-12-14 MED FILL — LANTUS SOLOSTAR 100 UNITS/M: 100 | 30 days supply | Qty: 9 | Fill #0

## 2020-12-14 MED FILL — ACCU-CHEK GUIDE TEST STRIP: 25 days supply | Qty: 100 | Fill #0

## 2020-12-14 MED FILL — NOVOLOG FLEXPEN SYRINGE: 100 | 25 days supply | Qty: 3 | Fill #0

## 2020-12-14 NOTE — Discharge Instructions (Signed)
Local Endocrinologists  [facility] [phone]) 1. Dr. Carlus Pavlov 2. Dr. Reather Littler 3. Abby Shamleffer 4. Dr. Romero Belling (don't get) [facility] [phone]) 1. Dr. Talmage Coin [facility] [phone]) 1. Dr. Dorisann Frames 2. Dr. Marlene Lard [facility] [phone]) 1. Dr. Deirdre Pippins [facility] [phone]) [[place] office]  [phone]) [[place] office] 1. Dr. Efraim Kaufmann [place] 2. Dr. Verdis Frederickson [facility]) [phone]) 1. Autumn Hudnall Yetta Barre), [place] 2. Dr. Izell [place] 3. Dr. Jillyn Ledger.   Vaginal Delivery, Care After This sheet gives you information about how to care for yourself after your delivery. Your health care provider may also give you more specific instructions. If you have problems or questions, contact your health care provider. What can I expect after the procedure? After delivery, it is common to have:  Soreness in your abdomen, your vagina, and the area between the opening of your vagina and your anus (perineum).  Tiredness (fatigue).  Cramps.  Breast tenderness related to engorgement.  Some bleeding and discharge from your vagina. This may continue for about 6 weeks. The bleeding and discharge will start out red, then become pink, then yellow, and finally white.  Tenderness in your vagina or perineum if you had an episiotomy or a vaginal tear. This may last several weeks.  Emotions that change quickly. Common emotions include: ? Sadness. ? Anger. ? Denial. ? Guilt. ? Depression. Follow these instructions at home: Vaginal and perineal care  Keep your perineum clean and dry as told by your health care provider.  Wipe from front to back when you use the toilet.  If you have an episiotomy or a vaginal tear, check for signs of infection, such as: ? Increasing redness, swelling, or pain in your perineal  area. ? Pus or bad-smelling discharge coming from your wound or vagina.  To relieve pain at the wound area or pain caused by hemorrhoids, try taking a warm sitz bath 2-3 times a day.  Do not use tampons or douche until your health care provider says it is okay. Medicines  Take over-the-counter and prescription medicines only as told by your health care provider.  If you were prescribed an antibiotic medicine, take it as told by your health care provider. Do not stop taking the antibiotic even if you start to feel better. Eating and drinking  Drink enough fluids to keep your urine pale yellow.  Eat high-fiber foods every day. These foods may help prevent or relieve constipation. High-fiber foods include: ? Whole grain cereals and breads. ? Brown rice. ? Beans. ? Fresh fruits and vegetables.   Activity  If possible, have someone help you with your household activities for at least a few days after you leave the hospital.  Return to your normal activities as told by your health care provider. Ask your health care provider what activities are safe for you.  Rest as much as possible.  Talk with your health care provider about when you can engage in sexual activity. This may depend on your: ? Risk of infection. ? Rate of healing. ? Comfort and desire to engage in sexual activity. Emotional support  Consider seeking support for your loss. Some forms of support that you might consider include your religious leader, friends, family, a Pharmacist, hospital, or a bereavement support group. General instructions  Wear a supportive and well-fitting bra.  If you pass a blood clot, save it and call your health care provider to discuss. Do not  flush blood clots down the toilet before you get instructions from your health care provider.  Keep all of your scheduled postpartum visits. At these visits, your health care provider will check to make sure that you are healing, both physically and  emotionally. Contact a health care provider if:  You feel sad or depressed.  You are having trouble eating or sleeping.  You lose interest in activities you used to enjoy.  You pass a blood clot from your vagina.  You have pus or a bad-smelling discharge coming from your wound or vagina.  You have increasing redness, swelling, or pain in your perineal area.  You feel pain or burning when you urinate.  You urinate more often than normal.  You have a fever.  Your breasts become hard, red, or painful.  You are dizzy or light-headed.  You have a rash.  You feel nauseous or you vomit.  You have not had a menstrual period by the 12th week after delivery. Get help right away if:  You have persistent pain that is not relieved by comfort measures or medicines.  You have chest pain or trouble breathing.  You have blurred vision or you see spots.  You have a severe headache.  You faint.  You have sudden, severe leg pain.  You bleed from your vagina so much that you fill more than one sanitary pad in one hour. Bleeding should not be heavier than your heaviest period.  You have thoughts of hurting yourself. If you ever feel like you may hurt yourself or others, or have thoughts about taking your own life, get help right away. You can go to your nearest emergency department or call:  Your local emergency services (911 in the [place]  A suicide crisis helpline, such as the National Suicide Prevention Lifeline at (820)136-0052. This is open 24 hours a day. Summary  Take over-the-counter and prescription medicines only as told by your health care provider.  Return to your normal activities as told by your health care provider. Ask your health care provider what activities are safe for you.  Consider getting support for your loss. Sources of support include religious leaders, friends, family, professional counselors, and bereavement support groups.  Keep all follow-up visits  as told by your health care provider. This is important. This information is not intended to replace advice given to you by your health care provider. Make sure you discuss any questions you have with your health care provider. Document Revised: 09/19/2017 Document Reviewed: 12/14/2016 Elsevier Patient Education  2021 [employer].

## 2020-12-14 NOTE — Discharge Summary (Signed)
Postpartum Discharge Summary  Date of Service updated 3/29     Patient Name: Megan Silva DOB: [birth year] MRN: [id number]  Date of admission: 12/11/2020 Delivery date:12/11/2020  Delivering provider: Randa Ngo  Date of discharge: 12/14/2020  Admitting diagnosis: Indication for care in labor or delivery [O75.9] Intrauterine pregnancy: Unknown     Secondary diagnosis:  Active Problems:   Indication for care in labor or delivery  Additional problems: T1 DM, DKA -PPROM -Fetal demise -Prior C-section -Noncompliance with medication -Anemia Discharge diagnosis: Preterm Pregnancy Delivered and Type 1 DM, DKA and same as above                                              Post partum procedures:none Augmentation: N/A Complications: [facility] course: Onset of Labor With Vaginal Delivery      25 y.o. yo G2P1101 at Unknown was admitted in Active Labor/PPROM on 12/11/2020. Patient had an uncomplicated labor course as follows:  Membrane Rupture Time/Date: 6:00 AM ,12/11/2020   Delivery Method:Vaginal, Spontaneous  Episiotomy: None  Lacerations:  None  Labor and postpartum course were complicated by DKA.  Pt required critical care management- see note for further details.  Pt improved with resuscitative measure including IV fluids and insulin.  Glucose improved and was able to transition to lantus and sliding scale coverage. She is ambulating, tolerating a regular diet, passing flatus, and urinating well. Patient is discharged home in stable condition on 12/14/20.  Newborn Data: Birth date:12/11/2020  Birth time:6:40 AM  Gender:  Living status: fetal demise Apgars: ,  Weight:   Magnesium Sulfate received: No BMZ received: No Rhophylac:N/A MMR:N/A T-DaP:Given prenatally Flu: No Transfusion:No  Physical exam  Vitals:   12/13/20 1507 12/13/20 1938 12/14/20 0458 12/14/20 0735  BP: 136/75 113/65 119/75 127/90  Pulse: 94 98 90 93  Resp: 18 17 16 17   Temp: 98 F  (36.7 C) 98.1 F (36.7 C) 98 F (36.7 C) 98 F (36.7 C)  TempSrc: Oral Oral Oral Oral  SpO2: 100% 100% 100% 100%  Height:       General: alert, cooperative and no distress Lochia: appropriate Uterine Fundus: firm Incision: N/A DVT Evaluation: No evidence of DVT seen on physical exam. Labs: Lab Results  Component Value Date   WBC 11.9 (H) 12/13/2020   HGB 10.5 (L) 12/13/2020   HCT 33.0 (L) 12/13/2020   MCV 76.7 (L) 12/13/2020   PLT 310 12/13/2020   CMP Latest Ref Rng & Units 12/13/2020  Glucose 70 - 99 mg/dL 209(H)  BUN 6 - 20 mg/dL 7  Creatinine 0.44 - 1.00 mg/dL 0.72  Sodium 135 - 145 mmol/L 137  Potassium 3.5 - 5.1 mmol/L 3.3(L)  Chloride 98 - 111 mmol/L 108  CO2 22 - 32 mmol/L 23  Calcium 8.9 - 10.3 mg/dL 8.4(L)  Total Protein 6.5 - 8.1 g/dL -  Total Bilirubin 0.3 - 1.2 mg/dL -  Alkaline Phos 38 - 126 U/L -  AST 15 - 41 U/L -  ALT 0 - 44 U/L -   Edinburgh Score: Edinburgh Postnatal Depression Scale Screening Tool 07/07/2018  I have been able to laugh and see the funny side of things. 0  I have looked forward with enjoyment to things. 0  I have blamed myself unnecessarily when things went wrong. 0  I have been anxious or worried  for no good reason. 0  I have felt scared or panicky for no good reason. 0  Things have been getting on top of me. 1  I have been so unhappy that I have had difficulty sleeping. 0  I have felt sad or miserable. 0  I have been so unhappy that I have been crying. 0  The thought of harming myself has occurred to me. 0  Edinburgh Postnatal Depression Scale Total 1     After visit meds:  Allergies as of 12/14/2020   No Known Allergies     Medication List    STOP taking these medications   insulin lispro 100 UNIT/ML KwikPen Commonly known as: HumaLOG KwikPen   INSULIN SYRINGE .3CC/31GX5/16" 31G X 5/16" 0.3 ML Misc     TAKE these medications   BLOOD GLUCOSE METER DISPOSABLE Devi Use one blood glucose strip to check blood sugar as  directed   ibuprofen 600 MG tablet Commonly known as: ADVIL Take 1 tablet (600 mg total) by mouth every 6 (six) hours as needed for moderate pain or cramping.   insulin aspart 100 UNIT/ML FlexPen Commonly known as: NOVOLOG Inject 4 Units into the skin 3 (three) times daily with meals.   insulin glargine 100 UNIT/ML Solostar Pen Commonly known as: LANTUS Inject 25 Units into the skin daily. What changed: when to take this   Lancets Misc. Misc Use one lancet to check blood sugar as directed   PEN NEEDLES 31GX5/16" 31G X 8 MM Misc 1 Dose by Does not apply route 4 (four) times daily. Use 4 pen tips daily to inject insulin into the skin What changed: Another medication with the same name was added. Make sure you understand how and when to take each.   Insulin Pen Needle 32G X 4 MM Misc Use one pen needle to check blood sugar as directed What changed: You were already taking a medication with the same name, and this prescription was added. Make sure you understand how and when to take each.        Discharge home in stable condition Infant Feeding: n/a Infant [place] Discharge instruction: per After Visit Summary and Postpartum booklet. Activity: Advance as tolerated. Pelvic rest for 6 weeks.  Diet: carb modified diet Future Appointments: Future Appointments  Date Time Provider Aneth  12/21/2020  3:15 PM Marianna Payment, MD [facility]   Follow up Visit:  [place] Follow up.   Why: Appointment is April 4th - Tuesday at 3:15 ( arrive at 3:00) with Dr. Junious Dresser information: [address] [place] [facility] [place] [facility] Follow up in 2 week(s).   Specialty: Obstetrics and Gynecology Contact information: [place] [phone]               Please schedule  this patient for a In person postpartum visit in 2 weeks with the following provider: Any provider. Additional Postpartum F/U:Postpartum Depression checkup  High risk pregnancy complicated by: Type 1 DM Delivery mode:  Vaginal, Spontaneous  Anticipated Birth Control:  OCPs   12/14/2020 Annalee Genta, DO

## 2020-12-14 NOTE — Progress Notes (Deleted)
PC from [place] in radiology asking if the patient had eaten breakfast. Patient stating that she had a bite of froot loops around 0800, and has been sipping on water and apple juice all morning, last around 11am when she returned from MRI. Per Caryn Bee, please keep her NPO until POC is determined. Patient instructed and verbalized understanding.

## 2020-12-15 LAB — BPAM RBC
Blood Product Expiration Date: 202204192359
Blood Product Expiration Date: 202204202359
Unit Type and Rh: 7300
Unit Type and Rh: 7300

## 2020-12-15 LAB — TYPE AND SCREEN
ABO/RH(D): B POS
Antibody Screen: POSITIVE
Donor AG Type: NEGATIVE
Donor AG Type: NEGATIVE
Unit division: 0
Unit division: 0

## 2020-12-15 NOTE — Telephone Encounter (Signed)
Per chart, pt was discharged yesterday from [facility] (OB/GYN). I will call pt at a later date.

## 2020-12-16 ENCOUNTER — Encounter (HOSPITAL_COMMUNITY): Payer: Self-pay | Admitting: Family Medicine

## 2020-12-19 MED FILL — Medication: Qty: 1 | Status: AC

## 2020-12-21 ENCOUNTER — Other Ambulatory Visit: Payer: Self-pay

## 2020-12-21 ENCOUNTER — Ambulatory Visit: Payer: Medicaid Other | Admitting: Internal Medicine

## 2020-12-21 VITALS — BP 126/81 | HR 74 | Temp 98.5°F | Ht 67.0 in | Wt 151.3 lb

## 2020-12-21 DIAGNOSIS — O4200 Premature rupture of membranes, onset of labor within 24 hours of rupture, unspecified weeks of gestation: Secondary | ICD-10-CM | POA: Diagnosis not present

## 2020-12-21 DIAGNOSIS — E109 Type 1 diabetes mellitus without complications: Secondary | ICD-10-CM | POA: Diagnosis present

## 2020-12-21 DIAGNOSIS — O24913 Unspecified diabetes mellitus in pregnancy, third trimester: Secondary | ICD-10-CM

## 2020-12-21 NOTE — Patient Instructions (Signed)
Thank you, [name] for allowing [place] to provide your care today. Today we discussed Diabetes.    I have ordered the following labs for you:  Lab Orders  No laboratory test(s) ordered today     Tests ordered today:  none  Referrals ordered today:    Referral Orders     Ambulatory referral to diabetic education     Ambulatory referral to Integrated Behavioral Health   Medication Changes:   There are no discontinued medications.   No orders of the defined types were placed in this encounter.    Instructions: The office will call you to schedule an appointment with Megan Silva for diabetes education.  Follow up: 2 weeks     Should you have any questions or concerns please call the internal medicine clinic at [phone].     Megan Silva, D.O. [facility]

## 2020-12-21 NOTE — Progress Notes (Signed)
 CC: DM  HPI:  [name] is a 25 y.o. female with a past medical history stated below and presents today for DM. Please see problem based assessment and plan for additional details.  Past Medical History:  Diagnosis Date  . Diabetes type 1, controlled (HCC)     Current Outpatient Medications on File Prior to Visit  Medication Sig Dispense Refill  . Accu-Chek Softclix Lancets lancets USE ONE LANCET TO CHECK BLOOD SUGAR FOUR TIMES DAILY AS DIRECTED 100 each 11  . Blood Gluc Meter Disp-Strips (BLOOD GLUCOSE METER DISPOSABLE) DEVI Use one blood glucose strip to check blood sugar as directed 1 each 11  . Blood Glucose Monitoring Suppl (ACCU-CHEK GUIDE) w/Device KIT USE AS DIRECTED TO CHECK BLOOD SUGAR AS DIRECTED 1 kit 11  . glucose blood test strip USE AS DIRECTED TO CHECK BLOOD SUGARS FOUR TIMES DAILY 100 strip 11  . ibuprofen (ADVIL) 600 MG tablet Take 1 tablet (600 mg total) by mouth every 6 (six) hours as needed for moderate pain or cramping. 30 tablet 0  . ibuprofen (ADVIL) 600 MG tablet TAKE 1 TABLET (600 MG TOTAL) BY MOUTH EVERY SIX HOURS AS NEEDED FOR MODERATE PAIN OR CRAMPING. 30 tablet 0  . insulin aspart (NOVOLOG) 100 UNIT/ML FlexPen Inject 4 Units into the skin 3 (three) times daily with meals. 15 mL 11  . insulin aspart (NOVOLOG) 100 UNIT/ML FlexPen INJECT 4 UNITS INTO THE SKIN THREE TIMES DAILY WITH MEALS. 15 mL 11  . insulin glargine (LANTUS) 100 UNIT/ML Solostar Pen Inject 25 Units into the skin daily. 15 mL 11  . insulin glargine (LANTUS) 100 UNIT/ML Solostar Pen INJECT 25 UNITS INTO THE SKIN DAILY. 15 mL 11  . Insulin Pen Needle (PEN NEEDLES 31GX5/16") 31G X 8 MM MISC 1 Dose by Does not apply route 4 (four) times daily. Use 4 pen tips daily to inject insulin into the skin 360 each 1  . Insulin Pen Needle 31G X 8 MM MISC USE AS DIRECTED FOUR TIMES DAILY. TO INJECT INSULIN INTO THE SKIN 360 each 1  . Insulin Pen Needle 32G X 4 MM MISC Use one pen needle to check blood  sugar as directed 100 each 11  . Insulin Pen Needle 32G X 4 MM MISC USE ONE PEN NEEDLE TO CHECK BLOOD SUGAR AS DIRECTED 100 each 11  . Lancets Misc. MISC Use one lancet to check blood sugar as directed 1 each 11   No current facility-administered medications on file prior to visit.    Family History  Problem Relation Age of Onset  . Diabetes Maternal Grandmother   . Cancer Paternal Grandmother     Social History   Socioeconomic History  . Marital status: Single    Spouse name: Not on file  . Number of children: Not on file  . Years of education: Not on file  . Highest education level: Not on file  Occupational History  . Not on file  Tobacco Use  . Smoking status: Never Smoker  . Smokeless tobacco: Never Used  Vaping Use  . Vaping Use: Never used  Substance and Sexual Activity  . Alcohol use: Yes    Comment: social  . Drug use: Never  . Sexual activity: Yes    Birth control/protection: None  Other Topics Concern  . Not on file  Social History Narrative  . Not on file   Social Determinants of Health   Financial Resource Strain: Not on file  Food Insecurity: Not   on file  Transportation Needs: Not on file  Physical Activity: Not on file  Stress: Not on file  Social Connections: Not on file  Intimate Partner Violence: Not on file    Review of Systems: ROS negative except for what is noted on the assessment and plan.  Vitals:   12/21/20 1530  BP: 126/81  Pulse: 74  Temp: 98.5 F (36.9 C)  TempSrc: Oral  SpO2: 100%  Weight: 151 lb 4.8 oz (68.6 kg)  Height: 5' 7" (1.702 m)     Physical Exam: Physical Exam Constitutional:      Appearance: Normal appearance.  HENT:     Head: Normocephalic and atraumatic.  Eyes:     Extraocular Movements: Extraocular movements intact.  Cardiovascular:     Rate and Rhythm: Normal rate.     Pulses: Normal pulses.  Pulmonary:     Effort: Pulmonary effort is normal.  Abdominal:     General: Bowel sounds are normal.      Palpations: Abdomen is soft.     Tenderness: There is no abdominal tenderness.  Musculoskeletal:        General: Normal range of motion.     Cervical back: Normal range of motion.     Right lower leg: No edema.     Left lower leg: No edema.  Skin:    General: Skin is warm and dry.  Neurological:     Mental Status: She is alert and oriented to person, place, and time. Mental status is at baseline.  Psychiatric:        Mood and Affect: Mood normal.      Assessment & Plan:   See Encounters Tab for problem based charting.  Patient discussed with Dr. Narendra   Benjamin Coe, D.O. [facility], PGY-2 Pager: [phone], Phone: [phone] Date 12/22/2020 Time 11:37 AM  

## 2020-12-22 ENCOUNTER — Encounter: Payer: Self-pay | Admitting: Internal Medicine

## 2020-12-22 NOTE — Assessment & Plan Note (Addendum)
Patient presents with follow-up for diabetes.  Patient states that she is currently taking Lantus, and NovoLog 4 units with meals.  She does admit to some high and low blood sugars and is well aware of the symptoms.  I think this patient would be a good candidate for CGM as well as further diabetic patient regarding carb counting to better titrate her insulin.  Therefore I will put in referral for diabetic education with Lupita Leash.  I will then reevaluate the patient's insulin medications in 1 to 2 weeks.  Plan: -Continue Lantus 25 units daily -Continue NovoLog 4 units 3 times daily -Refer to Lupita Leash for diabetic education. -Consider referral for endocrinology

## 2020-12-22 NOTE — Assessment & Plan Note (Signed)
Did not want to discuss the psychosocial effects of her recent loss of her unborn child.  Therefore, I referred her to behavioral health for counseling so she can have the resources to speak to someone regarding this matter.  She does have good social support with her partner and states that she is doing fine dealing with everything

## 2020-12-24 NOTE — Progress Notes (Signed)
Internal Medicine Clinic Attending  Case discussed with Dr. Coe  At the time of the visit.  We reviewed the resident's history and exam and pertinent patient test results.  I agree with the assessment, diagnosis, and plan of care documented in the resident's note.  

## 2020-12-24 NOTE — Telephone Encounter (Signed)
Pt did see Dr Marchia Bond on 12/21/20.

## 2020-12-27 ENCOUNTER — Encounter: Payer: Self-pay | Admitting: Obstetrics and Gynecology

## 2020-12-27 ENCOUNTER — Ambulatory Visit (INDEPENDENT_AMBULATORY_CARE_PROVIDER_SITE_OTHER): Payer: Medicaid Other | Admitting: Obstetrics and Gynecology

## 2020-12-27 ENCOUNTER — Other Ambulatory Visit: Payer: Self-pay

## 2020-12-27 ENCOUNTER — Encounter: Payer: Medicaid Other | Admitting: Dietician

## 2020-12-27 DIAGNOSIS — O165 Unspecified maternal hypertension, complicating the puerperium: Secondary | ICD-10-CM | POA: Insufficient documentation

## 2020-12-27 DIAGNOSIS — O0930 Supervision of pregnancy with insufficient antenatal care, unspecified trimester: Secondary | ICD-10-CM | POA: Insufficient documentation

## 2020-12-27 MED ORDER — AMLODIPINE BESYLATE 5 MG PO TABS: 5.0000 mg | ORAL_TABLET | Freq: Every day | ORAL | 0 refills | Status: DC

## 2020-12-27 NOTE — Progress Notes (Signed)
Post Partum Visit Note  Megan Silva is a 25 y.o. G28P1101 female who presents for a postpartum visit. She is s/p 3/26 SVD/intact perineum. No prenatal care in [place] but patient states she was getting care in [place] state. She was in fulminant DKA and presented with preterm labor and subsequently delivered shortly after arriving to the hospital. The baby appeared to be 21-22wks and had no fetal heart tones on arrival and after delivery, the APGARs were 0/0/0.    I have fully reviewed the prenatal and intrapartum course.  Anesthesia: none. Postpartum course has been uncomplicated. Bleeding no bleeding. Bowel function is normal. Bladder function is normal.  Postpartum depression screening: negative.  Patient denies any s/s of pre-eclampsia.    Edinburgh Postnatal Depression Scale - 12/27/20 0935      Edinburgh Postnatal Depression Scale:  In the Past 7 Days   I have been able to laugh and see the funny side of things. 0    I have looked forward with enjoyment to things. 0    I have blamed myself unnecessarily when things went wrong. 2    I have been anxious or worried for no good reason. 2    I have felt scared or panicky for no good reason. 0    Things have been getting on top of me. 0    I have been so unhappy that I have had difficulty sleeping. 2    I have felt sad or miserable. 0    I have been so unhappy that I have been crying. 2    The thought of harming myself has occurred to me. 0    Edinburgh Postnatal Depression Scale Total 8           Review of Systems Pertinent items are noted in HPI.  Objective:  BP (!) 118/91   Pulse 78   Ht 5\' 7"  (1.702 m)   Wt 140 lb 12.8 oz (63.9 kg)   BMI 22.05 kg/m    NAD  Assessment:  Normal PP exam  1. Neonatal death vs IUFD Patient doing well with this  2. Postpartum hypertension Pre-eclampsia labs today and precautions given. Pt amenable to starting norvasc 5mg  po qday today  3. DM1 Patient followed by internal medicine, but  patient is not compliant with checking her sugars; a1c in the hospital was 13.1, which is not atypical for her vs prior labs.  She wants to get pregnant and have a baby this year due to astrology reasons. I told her that even without her DM1, I would recommend waiting at least 9 months prior to trying to conceive again as a close interval pregnancy has a high risk for early labor, early birth, pre-eclampsia, etc, but I told her that I strongly discourage her from getting pregnant until her diabetes is under control. She states she was getting regular prenatal care in state and everything was fine at her last visit there in February. I told her that the preterm delivery was likely due to her uncontrolled DM1 and that she needs her a1c done to at least less than 8 and ideally less than 7.  I told her that elevated a1cs, especially in the range of where she is, puts the fetus at high risk for fetal anomalies/birth defects.   In no uncertain terms I told her not to get pregnant until her DM is under control which is likely going to take 6-12 months if she works at this, which  it doesn't seem like she is.   Patient declines birth control  4. Preterm delivery See above.   RTC 2 weeks for BP follow up and contraception discussion.   Enoch Bing, MD [facility] [employer], [facility]

## 2020-12-28 ENCOUNTER — Encounter: Payer: Self-pay | Admitting: Internal Medicine

## 2020-12-28 LAB — CBC
Hematocrit: 40.5 % (ref 34.0–46.6)
Hemoglobin: 12.2 g/dL (ref 11.1–15.9)
MCH: 23.4 pg — ABNORMAL LOW (ref 26.6–33.0)
MCHC: 30.1 g/dL — ABNORMAL LOW (ref 31.5–35.7)
MCV: 78 fL — ABNORMAL LOW (ref 79–97)
Platelets: 527 10*3/uL — ABNORMAL HIGH (ref 150–450)
RBC: 5.22 x10E6/uL (ref 3.77–5.28)
RDW: 14 % (ref 11.7–15.4)
WBC: 11.3 10*3/uL — ABNORMAL HIGH (ref 3.4–10.8)

## 2020-12-28 LAB — COMPREHENSIVE METABOLIC PANEL
ALT: 10 IU/L (ref 0–32)
AST: 11 IU/L (ref 0–40)
Albumin/Globulin Ratio: 1.3 (ref 1.2–2.2)
Albumin: 4.5 g/dL (ref 3.9–5.0)
Alkaline Phosphatase: 153 IU/L — ABNORMAL HIGH (ref 44–121)
BUN/Creatinine Ratio: 18 (ref 9–23)
BUN: 13 mg/dL (ref 6–20)
Bilirubin Total: 0.4 mg/dL (ref 0.0–1.2)
CO2: 18 mmol/L — ABNORMAL LOW (ref 20–29)
Calcium: 10.2 mg/dL (ref 8.7–10.2)
Chloride: 95 mmol/L — ABNORMAL LOW (ref 96–106)
Creatinine, Ser: 0.72 mg/dL (ref 0.57–1.00)
Globulin, Total: 3.6 g/dL (ref 1.5–4.5)
Glucose: 452 mg/dL — ABNORMAL HIGH (ref 65–99)
Potassium: 5.4 mmol/L — ABNORMAL HIGH (ref 3.5–5.2)
Sodium: 136 mmol/L (ref 134–144)
Total Protein: 8.1 g/dL (ref 6.0–8.5)
eGFR: 121 mL/min/{1.73_m2} (ref >59)

## 2020-12-29 ENCOUNTER — Encounter: Payer: Medicaid Other | Admitting: Dietician

## 2020-12-30 ENCOUNTER — Telehealth: Payer: Self-pay | Admitting: Lactation Services

## 2020-12-30 ENCOUNTER — Encounter: Payer: Self-pay | Admitting: Obstetrics and Gynecology

## 2020-12-30 ENCOUNTER — Ambulatory Visit: Payer: Medicaid Other | Admitting: Clinical

## 2020-12-30 DIAGNOSIS — Z91199 Patient's noncompliance with other medical treatment and regimen due to unspecified reason: Secondary | ICD-10-CM

## 2020-12-30 DIAGNOSIS — Z5329 Procedure and treatment not carried out because of patient's decision for other reasons: Secondary | ICD-10-CM

## 2020-12-30 DIAGNOSIS — R748 Abnormal levels of other serum enzymes: Secondary | ICD-10-CM | POA: Insufficient documentation

## 2020-12-30 HISTORY — DX: Abnormal levels of other serum enzymes: R74.8

## 2020-12-30 NOTE — Telephone Encounter (Signed)
-----   Message from Wharton Bing, MD sent at 12/30/2020 10:35 AM EDT ----- I sent her this yesterday but it looks like she has reviewed it. Can you let her know this: Your blood sugars were extremely elevated with your bloodwork and your potassium is elevated. If your blood sugars are still high, please call your insulin doctors or go to an urgent care

## 2020-12-30 NOTE — BH Specialist Note (Signed)
Pt did not arrive to video visit and did not answer the phone ; Left HIPPA-compliant message to call back Hridhaan Yohn from [facility] at [phone] (main office) or [phone] (Yeila Morro's office).  ; left MyChart message for patient.      

## 2020-12-30 NOTE — Telephone Encounter (Signed)
Attempted to call patient in regards to her latest blood work results. She did not answer. Received a message that message could not be completed at this time.   Will send My Chart message.

## 2021-01-04 ENCOUNTER — Encounter: Payer: Medicaid Other | Admitting: Internal Medicine

## 2021-01-04 NOTE — BH Specialist Note (Signed)
Pt did not arrive to video visit and did not answer the phone ; Left HIPPA-compliant message to call back Daschel Roughton from [facility] at [phone] (main office) or [phone] (Yaneisy Wenz's office).  ; left MyChart message for patient.      

## 2021-01-12 ENCOUNTER — Other Ambulatory Visit (HOSPITAL_COMMUNITY): Payer: Self-pay

## 2021-01-12 MED FILL — Insulin Aspart Soln Pen-injector 100 Unit/ML: SUBCUTANEOUS | 75 days supply | Qty: 9 | Fill #0 | Status: CN

## 2021-01-12 MED FILL — Insulin Glargine Soln Pen-Injector 100 Unit/ML: SUBCUTANEOUS | 60 days supply | Qty: 15 | Fill #0 | Status: CN

## 2021-01-12 MED FILL — Insulin Pen Needle 32 G X 4 MM (1/6" or 5/32"): 30 days supply | Qty: 100 | Fill #0 | Status: CN

## 2021-01-13 ENCOUNTER — Ambulatory Visit: Payer: Medicaid Other | Admitting: Nurse Practitioner

## 2021-01-14 ENCOUNTER — Ambulatory Visit: Payer: Medicaid Other | Admitting: Clinical

## 2021-01-14 DIAGNOSIS — Z91199 Patient's noncompliance with other medical treatment and regimen due to unspecified reason: Secondary | ICD-10-CM

## 2021-01-14 DIAGNOSIS — Z5329 Procedure and treatment not carried out because of patient's decision for other reasons: Secondary | ICD-10-CM

## 2021-01-19 ENCOUNTER — Institutional Professional Consult (permissible substitution): Payer: Medicaid Other | Admitting: Behavioral Health

## 2021-01-19 ENCOUNTER — Telehealth: Payer: Self-pay | Admitting: Behavioral Health

## 2021-01-19 ENCOUNTER — Other Ambulatory Visit: Payer: Self-pay

## 2021-01-19 NOTE — Telephone Encounter (Signed)
Contacted Pt over the span of 20 min on both phone numbers to connect for 1:00pm appt visit today. Lft msg when able & when home phone not able to take msg-  Asked Pt to call [facility] & r/s as needed.  Dr. Monna Fam

## 2021-01-20 ENCOUNTER — Other Ambulatory Visit (HOSPITAL_COMMUNITY): Payer: Self-pay

## 2021-01-28 ENCOUNTER — Other Ambulatory Visit (HOSPITAL_COMMUNITY): Payer: Self-pay

## 2021-01-28 MED FILL — Insulin Aspart Soln Pen-injector 100 Unit/ML: SUBCUTANEOUS | 75 days supply | Qty: 9 | Fill #0 | Status: AC

## 2021-01-28 MED FILL — Insulin Pen Needle 32 G X 4 MM (1/6" or 5/32"): 25 days supply | Qty: 100 | Fill #0 | Status: AC

## 2021-01-28 MED FILL — Insulin Glargine Soln Pen-Injector 100 Unit/ML: SUBCUTANEOUS | 60 days supply | Qty: 15 | Fill #0 | Status: AC

## 2021-02-01 ENCOUNTER — Ambulatory Visit: Payer: Medicaid Other | Admitting: Nurse Practitioner

## 2021-02-08 ENCOUNTER — Other Ambulatory Visit: Payer: Self-pay

## 2021-02-08 ENCOUNTER — Telehealth: Payer: Self-pay | Admitting: Behavioral Health

## 2021-02-08 ENCOUNTER — Ambulatory Visit: Payer: Medicaid Other | Admitting: Behavioral Health

## 2021-02-08 NOTE — Telephone Encounter (Signed)
Contacted Pt @ both numbers available on her Chart. Home phone mailbox is full. Mobile; VM not set up. This Pt was unable to confirm w/calls from our Dept twice. I will promote the Pt should return call as she has missed all contacts w/her for Referral.  Dr. Monna Fam

## 2021-03-09 NOTE — Addendum Note (Signed)
Addended by: Neomia Dear on: 03/09/2021 03:22 PM   Modules accepted: Orders

## 2021-03-22 ENCOUNTER — Encounter: Payer: Self-pay | Admitting: *Deleted

## 2021-03-25 ENCOUNTER — Other Ambulatory Visit: Payer: Self-pay

## 2021-03-25 ENCOUNTER — Other Ambulatory Visit (HOSPITAL_COMMUNITY): Payer: Self-pay

## 2021-03-25 MED FILL — Insulin Aspart Soln Pen-injector 100 Unit/ML: SUBCUTANEOUS | 75 days supply | Qty: 9 | Fill #1 | Status: AC

## 2021-03-25 MED FILL — Insulin Pen Needle 32 G X 4 MM (1/6" or 5/32"): 25 days supply | Qty: 100 | Fill #1 | Status: AC

## 2021-03-25 MED FILL — Insulin Glargine Soln Pen-Injector 100 Unit/ML: SUBCUTANEOUS | 60 days supply | Qty: 15 | Fill #1 | Status: AC

## 2021-03-28 ENCOUNTER — Other Ambulatory Visit (HOSPITAL_COMMUNITY): Payer: Self-pay

## 2021-03-28 ENCOUNTER — Other Ambulatory Visit: Payer: Self-pay

## 2021-03-29 ENCOUNTER — Telehealth: Payer: Self-pay | Admitting: *Deleted

## 2021-03-29 ENCOUNTER — Other Ambulatory Visit (HOSPITAL_COMMUNITY): Payer: Self-pay

## 2021-03-29 NOTE — Telephone Encounter (Signed)
We received a refill request for Ibuprofen 600 mg # 30 take 1 tablet by mouth every 6 hours as needed for moderate pain or cramping, but I didn't see this med on her med list. Please advise. If you will refill, please send to pharmacy. Thanks!! JSY

## 2021-03-30 ENCOUNTER — Other Ambulatory Visit: Payer: Self-pay | Admitting: Obstetrics & Gynecology

## 2021-03-30 ENCOUNTER — Other Ambulatory Visit (HOSPITAL_COMMUNITY): Payer: Self-pay

## 2021-03-30 ENCOUNTER — Other Ambulatory Visit: Payer: Self-pay

## 2021-03-30 MED ORDER — IBUPROFEN 600 MG PO TABS
600.0000 mg | ORAL_TABLET | Freq: Four times a day (QID) | ORAL | 0 refills | Status: DC | PRN
  Filled 2021-03-30: qty 30, 8d supply, fill #0

## 2021-03-30 NOTE — Progress Notes (Signed)
Pt requested ibuprofen refill from hospital

## 2021-03-31 ENCOUNTER — Other Ambulatory Visit (HOSPITAL_COMMUNITY): Payer: Self-pay

## 2021-04-01 ENCOUNTER — Other Ambulatory Visit (HOSPITAL_COMMUNITY): Payer: Self-pay

## 2021-04-04 ENCOUNTER — Other Ambulatory Visit: Payer: Self-pay

## 2021-04-04 ENCOUNTER — Other Ambulatory Visit (HOSPITAL_COMMUNITY): Payer: Self-pay

## 2021-04-04 MED ORDER — IBUPROFEN 600 MG PO TABS
600.0000 mg | ORAL_TABLET | Freq: Four times a day (QID) | ORAL | 2 refills | Status: DC | PRN
  Filled 2021-04-04 – 2021-06-07 (×2): qty 30, 8d supply, fill #0

## 2021-04-12 ENCOUNTER — Other Ambulatory Visit (HOSPITAL_COMMUNITY): Payer: Self-pay

## 2021-06-07 ENCOUNTER — Other Ambulatory Visit (HOSPITAL_COMMUNITY): Payer: Self-pay

## 2021-06-07 MED FILL — Glucose Blood Test Strip: 25 days supply | Qty: 100 | Fill #0 | Status: CN

## 2021-06-07 MED FILL — Insulin Glargine Soln Pen-Injector 100 Unit/ML: SUBCUTANEOUS | 60 days supply | Qty: 15 | Fill #0 | Status: AC

## 2021-06-07 MED FILL — Glucose Blood Test Strip: 25 days supply | Qty: 100 | Fill #0 | Status: AC

## 2021-06-07 MED FILL — Insulin Pen Needle 32 G X 4 MM (1/6" or 5/32"): 25 days supply | Qty: 100 | Fill #0 | Status: AC

## 2021-06-07 MED FILL — Insulin Aspart Soln Pen-injector 100 Unit/ML: SUBCUTANEOUS | 75 days supply | Qty: 9 | Fill #0 | Status: AC

## 2021-06-07 MED FILL — Insulin Pen Needle 32 G X 4 MM (1/6" or 5/32"): 25 days supply | Qty: 100 | Fill #2 | Status: CN

## 2021-08-01 ENCOUNTER — Encounter: Payer: Medicaid Other | Admitting: Internal Medicine

## 2021-08-01 NOTE — Progress Notes (Deleted)
endotooType 1 diabetes  hx of DKA Meds: Lantus 25 units, Novolog 4 units TID Previous A1c of 13.1, OBGYN w recommendation of improved A1c b4 another pregnancy.   A1c->  Screening C/ G

## 2021-08-18 ENCOUNTER — Encounter: Payer: Medicaid Other | Admitting: Internal Medicine

## 2021-08-18 ENCOUNTER — Telehealth: Payer: Self-pay | Admitting: *Deleted

## 2021-08-18 NOTE — Telephone Encounter (Signed)
Called patient lvm for patient to call the  clinic to reschedule her missed appointment , [phone].

## 2021-08-27 ENCOUNTER — Inpatient Hospital Stay (HOSPITAL_COMMUNITY): Payer: Medicaid Other

## 2021-08-27 ENCOUNTER — Inpatient Hospital Stay (HOSPITAL_COMMUNITY)
Admission: AD | Admit: 2021-08-27 | Discharge: 2021-08-28 | Disposition: A | Discharge status: Home / Self Care | Payer: Medicaid Other | Attending: Obstetrics & Gynecology | Admitting: Obstetrics & Gynecology

## 2021-08-27 ENCOUNTER — Encounter (HOSPITAL_COMMUNITY): Payer: Self-pay | Admitting: Emergency Medicine

## 2021-08-27 ENCOUNTER — Other Ambulatory Visit: Payer: Self-pay

## 2021-08-27 DIAGNOSIS — R45851 Suicidal ideations: Secondary | ICD-10-CM | POA: Insufficient documentation

## 2021-08-27 DIAGNOSIS — E1010 Type 1 diabetes mellitus with ketoacidosis without coma: Secondary | ICD-10-CM

## 2021-08-27 DIAGNOSIS — R109 Unspecified abdominal pain: Secondary | ICD-10-CM | POA: Diagnosis not present

## 2021-08-27 DIAGNOSIS — Z794 Long term (current) use of insulin: Secondary | ICD-10-CM | POA: Insufficient documentation

## 2021-08-27 DIAGNOSIS — O24011 Pre-existing diabetes mellitus, type 1, in pregnancy, first trimester: Secondary | ICD-10-CM | POA: Insufficient documentation

## 2021-08-27 DIAGNOSIS — Z79899 Other long term (current) drug therapy: Secondary | ICD-10-CM | POA: Diagnosis not present

## 2021-08-27 DIAGNOSIS — O99341 Other mental disorders complicating pregnancy, first trimester: Secondary | ICD-10-CM | POA: Insufficient documentation

## 2021-08-27 DIAGNOSIS — O3680X0 Pregnancy with inconclusive fetal viability, not applicable or unspecified: Secondary | ICD-10-CM

## 2021-08-27 DIAGNOSIS — O24913 Unspecified diabetes mellitus in pregnancy, third trimester: Secondary | ICD-10-CM

## 2021-08-27 DIAGNOSIS — E1065 Type 1 diabetes mellitus with hyperglycemia: Secondary | ICD-10-CM | POA: Insufficient documentation

## 2021-08-27 DIAGNOSIS — Z3A01 Less than 8 weeks gestation of pregnancy: Secondary | ICD-10-CM | POA: Diagnosis not present

## 2021-08-27 DIAGNOSIS — O26891 Other specified pregnancy related conditions, first trimester: Secondary | ICD-10-CM | POA: Insufficient documentation

## 2021-08-27 HISTORY — DX: Type 1 diabetes mellitus with hyperglycemia: E10.65

## 2021-08-27 HISTORY — DX: Depression, unspecified: F32.A

## 2021-08-27 HISTORY — DX: Type 2 diabetes mellitus with ketoacidosis without coma: E11.10

## 2021-08-27 HISTORY — DX: Anxiety disorder, unspecified: F41.9

## 2021-08-27 HISTORY — DX: Headache, unspecified: R51.9

## 2021-08-27 LAB — COMPREHENSIVE METABOLIC PANEL
ALT: 13 U/L (ref 0–44)
AST: 15 U/L (ref 15–41)
Albumin: 4.3 g/dL (ref 3.5–5.0)
Alkaline Phosphatase: 86 U/L (ref 38–126)
Anion gap: 12 (ref 5–15)
BUN: 10 mg/dL (ref 6–20)
CO2: 16 mmol/L — ABNORMAL LOW (ref 22–32)
Calcium: 9.4 mg/dL (ref 8.9–10.3)
Chloride: 102 mmol/L (ref 98–111)
Creatinine, Ser: 0.82 mg/dL (ref 0.44–1.00)
GFR, Estimated: >60 mL/min (ref >60)
Glucose, Bld: 389 mg/dL — ABNORMAL HIGH (ref 70–99)
Potassium: 5 mmol/L (ref 3.5–5.1)
Sodium: 130 mmol/L — ABNORMAL LOW (ref 135–145)
Total Bilirubin: 1.4 mg/dL — ABNORMAL HIGH (ref 0.3–1.2)
Total Protein: 8.1 g/dL (ref 6.5–8.1)

## 2021-08-27 LAB — I-STAT BETA HCG BLOOD, ED (MC, WL, AP ONLY): I-stat hCG, quantitative: 61.9 m[IU]/mL — ABNORMAL HIGH (ref <5)

## 2021-08-27 LAB — URINALYSIS, ROUTINE W REFLEX MICROSCOPIC
Bacteria, UA: NONE SEEN
Bilirubin Urine: NEGATIVE
Glucose, UA: >=500 mg/dL — AB
Hgb urine dipstick: NEGATIVE
Ketones, ur: 80 mg/dL — AB
Nitrite: NEGATIVE
Protein, ur: NEGATIVE mg/dL
Specific Gravity, Urine: 1.029 (ref 1.005–1.030)
pH: 5 (ref 5.0–8.0)

## 2021-08-27 LAB — CBC
HCT: 42.3 % (ref 36.0–46.0)
Hemoglobin: 13 g/dL (ref 12.0–15.0)
MCH: 24 pg — ABNORMAL LOW (ref 26.0–34.0)
MCHC: 30.7 g/dL (ref 30.0–36.0)
MCV: 78.2 fL — ABNORMAL LOW (ref 80.0–100.0)
Platelets: 395 10*3/uL (ref 150–400)
RBC: 5.41 MIL/uL — ABNORMAL HIGH (ref 3.87–5.11)
RDW: 15.2 % (ref 11.5–15.5)
WBC: 11.2 10*3/uL — ABNORMAL HIGH (ref 4.0–10.5)
nRBC: 0 % (ref 0.0–0.2)

## 2021-08-27 LAB — CBG MONITORING, ED: Glucose-Capillary: 325 mg/dL — ABNORMAL HIGH (ref 70–99)

## 2021-08-27 LAB — GLUCOSE, CAPILLARY: Glucose-Capillary: 224 mg/dL — ABNORMAL HIGH (ref 70–99)

## 2021-08-27 LAB — HEMOGLOBIN A1C
Hgb A1c MFr Bld: 13 % — ABNORMAL HIGH (ref 4.8–5.6)
Mean Plasma Glucose: 326.4 mg/dL

## 2021-08-27 LAB — BETA-HYDROXYBUTYRIC ACID: Beta-Hydroxybutyric Acid: 7.04 mmol/L — ABNORMAL HIGH (ref 0.05–0.27)

## 2021-08-27 LAB — HCG, QUANTITATIVE, PREGNANCY: hCG, Beta Chain, Quant, S: 79 m[IU]/mL — ABNORMAL HIGH (ref <5)

## 2021-08-27 LAB — LIPASE, BLOOD: Lipase: 30 U/L (ref 11–51)

## 2021-08-27 MED ORDER — INSULIN PEN NEEDLE 32G X 4 MM MISC: 11 refills | Status: DC

## 2021-08-27 MED ORDER — ACCU-CHEK GUIDE W/DEVICE KIT: PACK | 11 refills | Status: AC

## 2021-08-27 MED ORDER — INSULIN ASPART 100 UNIT/ML FLEXPEN: 4.0000 [IU] | PEN_INJECTOR | Freq: Three times a day (TID) | SUBCUTANEOUS | 11 refills | Status: DC

## 2021-08-27 MED ORDER — INSULIN PEN NEEDLE 31G X 8 MM MISC: 1 refills | Status: DC

## 2021-08-27 MED ORDER — SODIUM CHLORIDE 0.9 % IV SOLN: INTRAVENOUS | Status: DC

## 2021-08-27 MED ORDER — INSULIN ASPART 100 UNIT/ML IJ SOLN
17.0000 [IU] | Freq: Once | INTRAMUSCULAR | Status: AC
  Administered 2021-08-27: 17 [IU] via SUBCUTANEOUS

## 2021-08-27 MED ORDER — ACCU-CHEK SOFTCLIX LANCETS MISC: 11 refills | Status: DC

## 2021-08-27 MED ORDER — BLOOD GLUC METER DISP-STRIPS DEVI: 11 refills | Status: AC

## 2021-08-27 MED ORDER — INSULIN GLARGINE 100 UNIT/ML SOLOSTAR PEN: 25.0000 [IU] | PEN_INJECTOR | Freq: Every day | SUBCUTANEOUS | 11 refills | Status: DC

## 2021-08-27 MED ORDER — SODIUM CHLORIDE 0.9 % IV BOLUS
1000.0000 mL | Freq: Once | INTRAVENOUS | Status: AC
  Administered 2021-08-27: 1000 mL via INTRAVENOUS

## 2021-08-27 MED ORDER — GLUCOSE BLOOD VI STRP: ORAL_STRIP | 11 refills | Status: AC

## 2021-08-27 MED ORDER — "PEN NEEDLES 5/16"" 31G X 8 MM MISC": 1.0000 | Freq: Four times a day (QID) | 2 refills | Status: DC

## 2021-08-27 MED ORDER — LANCETS MISC. MISC: 11 refills | Status: AC

## 2021-08-27 NOTE — ED Triage Notes (Signed)
Type 1 diabetic- blood sugars 500s x 1 week.  Took + home preg test.  LMP 07/27/21.  Mild abd cramping since yesterday.

## 2021-08-27 NOTE — MAU Note (Signed)
Elevated blood sugar this past wk. Ran out of her insulin- was trying to stretch it.  Sent up from ER for further eval.  Is early preg, cramping in lower abd- started 2 days ago. Having some back pain now.  Denies bleeding. Missed her appt on the first.  Is a little anxious, the last time she was here she had a stillbirth.

## 2021-08-27 NOTE — MAU Note (Signed)
During IV start in L forearm - numerous horizontal scabbed marks noted from wrist to below antecub
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
## 2021-08-27 NOTE — Discharge Instructions (Signed)
Insufficient Money for Medicine:           [employer]: call "211"   MAP Program at [facility] Department - [place] [phone] or [place] [phone]
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
## 2021-08-27 NOTE — ED Provider Notes (Signed)
Emergency Medicine Provider Triage Evaluation Note  Megan Silva , a 25 y.o. female  was evaluated in triage.  Pt complains of 07/27/21.  G3P1101, [redacted]w[redacted]d by lmp.  She reports that over the past week she has been running low on her insulins and has been rationing them out.  She reports that she hasn't had any insulin since yesterday.  She hasn't checked her blood sugar today.    No fevers, or shortness of breath.  She reports that she has lower abdominal pain/cramping that started yesterday.   She states the pain is staying constant, is a 3 or 4 out of 10.  She just found out she was pregnant yesterday.  Review of Systems  Positive: Abdominal pain, dry mouth, Hyperglycemia.  Negative: Syncope, fever  Physical Exam  BP 122/81   Pulse (!) 113   Temp 98.9 F (37.2 C) (Oral)   Resp 14   SpO2 100%  Gen:   Awake, no distress   Resp:  Normal effort  MSK:   Moves extremities without difficulty  Other:  Mild Lower Abd TTP suprapubically.   Medical Decision Making  Medically screening exam initiated at 5:00 PM.  Appropriate orders placed.  Jeanni L Latendresse was informed that the remainder of the evaluation will be completed by another provider, this initial triage assessment does not replace that evaluation, and the importance of remaining in the ED until their evaluation is complete.  I spoke with Joni Reining at [facility] who accepts patient in transfer. 1715   Norman Clay 08/27/21 1717    Cheryll Cockayne, MD 08/29/21 2342

## 2021-08-28 ENCOUNTER — Other Ambulatory Visit: Payer: Self-pay

## 2021-08-28 DIAGNOSIS — O26891 Other specified pregnancy related conditions, first trimester: Secondary | ICD-10-CM | POA: Diagnosis not present

## 2021-08-28 LAB — GLUCOSE, CAPILLARY: Glucose-Capillary: 69 mg/dL — ABNORMAL LOW (ref 70–99)

## 2021-08-29 ENCOUNTER — Other Ambulatory Visit (HOSPITAL_COMMUNITY): Payer: Self-pay

## 2021-08-29 ENCOUNTER — Telehealth: Payer: Self-pay

## 2021-08-29 NOTE — Telephone Encounter (Addendum)
-----   Message from Gerrit Heck, [place] sent at 08/27/2021 11:42 PM EST ----- Regarding: Transportation This patient needs a repeat hCG on Tuesday Dec 12, but has expressed issues with transportation.  Please arrange for her appt.  Thanks,  Shanda Bumps   Notified pt that the provider would like her to come in on 08/30/21 Tuesday for her STAT.  Pt stated that she would be able to come at 1330 tomorrow.  Pt placed on lab schedule for STAT beta as the nurse schedule is full.  Front office notified to cancel appt on 08/31/21 for STAT.

## 2021-08-30 ENCOUNTER — Other Ambulatory Visit: Payer: Self-pay

## 2021-08-30 ENCOUNTER — Ambulatory Visit (INDEPENDENT_AMBULATORY_CARE_PROVIDER_SITE_OTHER): Payer: Medicaid Other | Admitting: Licensed Clinical Social Worker

## 2021-08-30 ENCOUNTER — Other Ambulatory Visit: Payer: Medicaid Other

## 2021-08-30 DIAGNOSIS — F331 Major depressive disorder, recurrent, moderate: Secondary | ICD-10-CM

## 2021-08-30 DIAGNOSIS — Z634 Disappearance and death of family member: Secondary | ICD-10-CM | POA: Diagnosis not present

## 2021-08-31 ENCOUNTER — Ambulatory Visit: Payer: Self-pay

## 2021-09-02 ENCOUNTER — Other Ambulatory Visit: Payer: Medicaid Other
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
## 2021-09-07 ENCOUNTER — Other Ambulatory Visit: Payer: Medicaid Other

## 2021-09-12 ENCOUNTER — Other Ambulatory Visit: Payer: Self-pay

## 2021-09-12 ENCOUNTER — Inpatient Hospital Stay (HOSPITAL_COMMUNITY): Payer: Medicaid Other

## 2021-09-12 ENCOUNTER — Encounter (HOSPITAL_COMMUNITY): Payer: Self-pay | Admitting: *Deleted

## 2021-09-12 ENCOUNTER — Inpatient Hospital Stay (HOSPITAL_COMMUNITY)
Admission: EM | Admit: 2021-09-12 | Discharge: 2021-09-12 | Disposition: A | Discharge status: Home / Self Care | Payer: Medicaid Other | Attending: Family Medicine | Admitting: Family Medicine

## 2021-09-12 DIAGNOSIS — R109 Unspecified abdominal pain: Secondary | ICD-10-CM | POA: Insufficient documentation

## 2021-09-12 DIAGNOSIS — Z3A01 Less than 8 weeks gestation of pregnancy: Secondary | ICD-10-CM | POA: Diagnosis not present

## 2021-09-12 DIAGNOSIS — Z6720 Type B blood, Rh positive: Secondary | ICD-10-CM | POA: Diagnosis not present

## 2021-09-12 DIAGNOSIS — R102 Pelvic and perineal pain: Secondary | ICD-10-CM | POA: Diagnosis not present

## 2021-09-12 DIAGNOSIS — O26891 Other specified pregnancy related conditions, first trimester: Secondary | ICD-10-CM | POA: Insufficient documentation

## 2021-09-12 DIAGNOSIS — N930 Postcoital and contact bleeding: Secondary | ICD-10-CM

## 2021-09-12 DIAGNOSIS — O26851 Spotting complicating pregnancy, first trimester: Secondary | ICD-10-CM | POA: Insufficient documentation

## 2021-09-12 DIAGNOSIS — Z6790 Unspecified blood type, Rh positive: Secondary | ICD-10-CM

## 2021-09-12 DIAGNOSIS — Z3491 Encounter for supervision of normal pregnancy, unspecified, first trimester: Secondary | ICD-10-CM

## 2021-09-12 LAB — CBC WITH DIFFERENTIAL/PLATELET
Abs Immature Granulocytes: 0.07 10*3/uL (ref 0.00–0.07)
Basophils Absolute: 0.1 10*3/uL (ref 0.0–0.1)
Basophils Relative: 1 %
Eosinophils Absolute: 0.3 10*3/uL (ref 0.0–0.5)
Eosinophils Relative: 2 %
HCT: 39.9 % (ref 36.0–46.0)
Hemoglobin: 12 g/dL (ref 12.0–15.0)
Immature Granulocytes: 1 %
Lymphocytes Relative: 26 %
Lymphs Abs: 3.5 10*3/uL (ref 0.7–4.0)
MCH: 24.1 pg — ABNORMAL LOW (ref 26.0–34.0)
MCHC: 30.1 g/dL (ref 30.0–36.0)
MCV: 80.1 fL (ref 80.0–100.0)
Monocytes Absolute: 1 10*3/uL (ref 0.1–1.0)
Monocytes Relative: 8 %
Neutro Abs: 8.4 10*3/uL — ABNORMAL HIGH (ref 1.7–7.7)
Neutrophils Relative %: 62 %
Platelets: 451 10*3/uL — ABNORMAL HIGH (ref 150–400)
RBC: 4.98 MIL/uL (ref 3.87–5.11)
RDW: 15.5 % (ref 11.5–15.5)
WBC: 13.3 10*3/uL — ABNORMAL HIGH (ref 4.0–10.5)
nRBC: 0 % (ref 0.0–0.2)

## 2021-09-12 LAB — URINALYSIS, ROUTINE W REFLEX MICROSCOPIC
Bilirubin Urine: NEGATIVE
Glucose, UA: >=500 mg/dL — AB
Ketones, ur: NEGATIVE mg/dL
Leukocytes,Ua: NEGATIVE
Nitrite: NEGATIVE
Protein, ur: NEGATIVE mg/dL
Specific Gravity, Urine: 1.025 (ref 1.005–1.030)
pH: 5.5 (ref 5.0–8.0)

## 2021-09-12 LAB — BASIC METABOLIC PANEL
Anion gap: 9 (ref 5–15)
BUN: 12 mg/dL (ref 6–20)
CO2: 23 mmol/L (ref 22–32)
Calcium: 10.3 mg/dL (ref 8.9–10.3)
Chloride: 106 mmol/L (ref 98–111)
Creatinine, Ser: 0.61 mg/dL (ref 0.44–1.00)
GFR, Estimated: >60 mL/min (ref >60)
Glucose, Bld: 88 mg/dL (ref 70–99)
Potassium: 4.6 mmol/L (ref 3.5–5.1)
Sodium: 138 mmol/L (ref 135–145)

## 2021-09-12 LAB — URINALYSIS, MICROSCOPIC (REFLEX): Bacteria, UA: NONE SEEN

## 2021-09-12 NOTE — ED Notes (Signed)
Report called to [facility], MAU and transport called

## 2021-09-12 NOTE — MAU Note (Signed)
Presents with c/o VB, pelvic pain, abdominal cramping that began last night.  Reports VB is spotting with wiping.  Reports last intercourse was 09/11/21.

## 2021-09-12 NOTE — ED Triage Notes (Signed)
Pt reports being approx [redacted] weeks pregnant. Had intercourse last night and now has mild vaginal bleeding and cramping.

## 2021-09-12 NOTE — ED Provider Notes (Signed)
Emergency Medicine Provider Triage Evaluation Note  Megan Silva , a 25 y.o. female  was evaluated in triage.  Pt complains of vaginal bleeding since last night. Known IUP 6 weeks. Patient reports she did have sexual intercourse last night. Light bleeding, cramping 6/10 pain, nausea without vomiting. She is not feeling lightheaded. She is G3P2, one of her pregnancies did end in a still birth.  Review of Systems  Positive: As above Negative: As above  Physical Exam  LMP 07/27/2021  Gen:   Awake, no distress   Resp:  Normal effort  MSK:   Moves extremities without difficulty  Other:  Minimal ttp abdomen  Medical Decision Making  Medically screening exam initiated at 1:57 PM.  Appropriate orders placed.  Megan Silva was informed that the remainder of the evaluation will be completed by another provider, this initial triage assessment does not replace that evaluation, and the importance of remaining in the ED until their evaluation is complete.  Spoke with MAU APP [facility] who accepts patient, will call the MAU nurse and initiate transfer Workup initiated   Megan Floss, PA-C 09/12/21 1400    Megan Plan, DO 09/12/21 1616

## 2021-09-12 NOTE — MAU Provider Note (Signed)
History     CSN: [id number]  Arrival date and time: 09/12/21 1225  Event Date/Time  First Provider Initiated Contact with Patient 09/12/21 1656     Chief Complaint  Patient presents with   Vaginal Bleeding   Pelvic Pain   Cramps   HPI Megan Silva is a 25 y.o. G3P1001 with pregnancy of unknown location. She presents to MAU from [facility] for evaluation of recurrent vaginal spotting and suprapubic cramping. Current episode of bleeding and cramping began during intercourse yesterday 09/11/2021. Patient reports seeing smears of blood when she wipes. Her suprapubic pain is scored at 5/10. Her pain radiates to her pelvis. She denies aggravating or alleviating factors. She has not taken medication or tried other treatments for this complaint.   OB History     Gravida  3   Para  2   Term  1   Preterm  0   AB  0   Living  1      SAB  0   IAB  0   Ectopic  0   Multiple  0   Live Births  1           Past Medical History:  Diagnosis Date   Anxiety    Depression    DKA (diabetic ketoacidosis) ([place])    Dysuria 01/26/2020   GBS bacteriuria 05/27/2018   Headache    Preterm delivery 12/27/2020   11/2020: in the setting of untreated DKA. Scant or no prenatal care. 22-23wks based on neonate size. Unknown if pprom first or pprom during course of PTL.  Neonate passed. APGAR 0/0/0   Uncontrolled type 1 diabetes mellitus with hyperglycemia [facility])     Past Surgical History:  Procedure Laterality Date   CESAREAN SECTION N/A 07/06/2018   Procedure: CESAREAN SECTION;  Surgeon: Chancy Milroy, MD;  Location: [facility];  Service: Obstetrics;  Laterality: N/A;   WISDOM TOOTH EXTRACTION      Family History  Problem Relation Age of Onset   Diabetes Maternal Grandmother    Cancer Paternal Grandmother     Social History   Tobacco Use   Smoking status: Never   Smokeless tobacco: Never  Vaping Use   Vaping Use: Never used  Substance Use Topics   Alcohol use:  Yes    Comment: social   Drug use: Never    Allergies: No Known Allergies  Medications Prior to Admission  Medication Sig Dispense Refill Last Dose   Accu-Chek Softclix Lancets lancets USE ONE LANCET TO CHECK BLOOD SUGAR FOUR TIMES DAILY AS DIRECTED 100 each 11    amLODipine (NORVASC) 5 MG tablet Take 1 tablet (5 mg total) by mouth daily. 42 tablet 0    BD PEN NEEDLE NANO 2ND GEN 32G X 4 MM MISC USE ONE PEN NEEDLE TO CHECK BLOOD SUGAR AS DIRECTED 100 each 11    Blood Gluc Meter Disp-Strips (BLOOD GLUCOSE METER DISPOSABLE) DEVI Use one blood glucose strip to check blood sugar as directed 1 each 11    Blood Glucose Monitoring Suppl (ACCU-CHEK GUIDE) w/Device KIT USE AS DIRECTED TO CHECK BLOOD SUGAR AS DIRECTED 1 kit 11    glucose blood test strip USE AS DIRECTED TO CHECK BLOOD SUGARS FOUR TIMES DAILY 100 strip 11    insulin aspart (NOVOLOG) 100 UNIT/ML FlexPen INJECT 4 UNITS INTO THE SKIN THREE TIMES DAILY WITH MEALS. 15 mL 11    insulin aspart (NOVOLOG) 100 UNIT/ML FlexPen Inject 4 Units into the skin 3 (three)  times daily with meals. 15 mL 11    insulin glargine (LANTUS) 100 UNIT/ML Solostar Pen INJECT 25 UNITS INTO THE SKIN DAILY. 15 mL 11    insulin glargine (LANTUS) 100 UNIT/ML Solostar Pen Inject 25 Units into the skin daily. 15 mL 11    Insulin Pen Needle (PEN NEEDLES 31GX5/16") 31G X 8 MM MISC 1 Dose by Does not apply route 4 (four) times daily. Use 4 pen tips daily to inject insulin into the skin 100 each 2    Insulin Pen Needle 31G X 8 MM MISC USE AS DIRECTED FOUR TIMES DAILY. TO INJECT INSULIN INTO THE SKIN 360 each 1    Insulin Pen Needle 32G X 4 MM MISC USE ONE PEN NEEDLE TO CHECK BLOOD SUGAR AS DIRECTED 100 each 11    Lancets Misc. MISC Use one lancet to check blood sugar as directed 1 each 11     Review of Systems  Gastrointestinal:  Positive for abdominal pain.  Genitourinary:  Positive for vaginal bleeding.  All other systems reviewed and are negative. Physical Exam    Blood pressure 111/69, pulse (!) 101, temperature 98.2 F (36.8 C), temperature source Oral, resp. rate 20, height _0  (1.702 m), weight 65.4 kg, last menstrual period 07/27/2021, SpO2 100 %, unknown if currently breastfeeding.  Physical Exam Vitals and nursing note reviewed. Exam conducted with a chaperone present.  Constitutional:      General: She is not in acute distress.    Appearance: Normal appearance. She is not ill-appearing.  Cardiovascular:     Rate and Rhythm: Normal rate and regular rhythm.     Pulses: Normal pulses.     Heart sounds: Normal heart sounds.  Pulmonary:     Effort: Pulmonary effort is normal.     Breath sounds: Normal breath sounds.  Abdominal:     General: Abdomen is flat.  Skin:    Capillary Refill: Capillary refill takes less than 2 seconds.  Neurological:     Mental Status: She is alert and oriented to person, place, and time.  Psychiatric:        Mood and Affect: Mood normal.        Behavior: Behavior normal.        Thought Content: Thought content normal.        Judgment: Judgment normal.    MAU Course/MDM  Procedures  --EMR reviewed. Unable to confirm IUP during previous MAU encounter on 08/27/2021. Patient unable to attend subsequent follow up in office. Discussed with patient if confirmed live IUP today blood work is not indicated. Blood work initiated in [facility] prior to transfer. Will defer additional blood work (Quant hCG) until results of ultrasound are received. Patient agreeable with this plan.  --Vaginal swabs declined by patient  Orders Placed This Encounter  Procedures   Wet prep, genital   US OB LESS THAN 14 WEEKS WITH OB TRANSVAGINAL   CBC with Differential   Basic metabolic panel   Urinalysis, Routine w reflex microscopic Urine, Clean Catch   Urinalysis, Microscopic (reflex)   Discharge patient   Patient Vitals for the past 24 hrs:  BP Temp Temp src Pulse Resp SpO2 Height Weight  09/12/21 1716 -- -- -- -- 15 100 % -- --   09/12/21 1714 124/73 -- -- 98 -- -- -- --  09/12/21 1534 111/69 98.2 F (36.8 C) Oral (!) 101 20 100 % -- --  09/12/21 1528 -- -- -- -- -- -- _1  (1.702 m) 65.4 kg  09/12/21 1404 131/86 98.8 F (37.1 C) Oral (!) 102 16 100 % -- --   Results for orders placed or performed during the hospital encounter of 09/12/21 (from the past 24 hour(s))  CBC with Differential     Status: Abnormal   Collection Time: 09/12/21  1:59 PM  Result Value Ref Range   WBC 13.3 (H) 4.0 - 10.5 K/uL   RBC 4.98 3.87 - 5.11 MIL/uL   Hemoglobin 12.0 12.0 - 15.0 g/dL   HCT 39.9 36.0 - 46.0 %   MCV 80.1 80.0 - 100.0 fL   MCH 24.1 (L) 26.0 - 34.0 pg   MCHC 30.1 30.0 - 36.0 g/dL   RDW 15.5 11.5 - 15.5 %   Platelets 451 (H) 150 - 400 K/uL   nRBC 0.0 0.0 - 0.2 %   Neutrophils Relative % 62 %   Neutro Abs 8.4 (H) 1.7 - 7.7 K/uL   Lymphocytes Relative 26 %   Lymphs Abs 3.5 0.7 - 4.0 K/uL   Monocytes Relative 8 %   Monocytes Absolute 1.0 0.1 - 1.0 K/uL   Eosinophils Relative 2 %   Eosinophils Absolute 0.3 0.0 - 0.5 K/uL   Basophils Relative 1 %   Basophils Absolute 0.1 0.0 - 0.1 K/uL   Immature Granulocytes 1 %   Abs Immature Granulocytes 0.07 0.00 - 0.07 K/uL  Basic metabolic panel     Status: None   Collection Time: 09/12/21  1:59 PM  Result Value Ref Range   Sodium 138 135 - 145 mmol/L   Potassium 4.6 3.5 - 5.1 mmol/L   Chloride 106 98 - 111 mmol/L   CO2 23 22 - 32 mmol/L   Glucose, Bld 88 70 - 99 mg/dL   BUN 12 6 - 20 mg/dL   Creatinine, Ser 0.61 0.44 - 1.00 mg/dL   Calcium 10.3 8.9 - 10.3 mg/dL   GFR, Estimated >60 >60 mL/min   Anion gap 9 5 - 15  Urinalysis, Routine w reflex microscopic Urine, Clean Catch     Status: Abnormal   Collection Time: 09/12/21  2:18 PM  Result Value Ref Range   Color, Urine YELLOW YELLOW   APPearance CLEAR CLEAR   Specific Gravity, Urine 1.025 1.005 - 1.030   pH 5.5 5.0 - 8.0   Glucose, UA >=500 (A) NEGATIVE mg/dL   Hgb urine dipstick TRACE (A) NEGATIVE   Bilirubin  Urine NEGATIVE NEGATIVE   Ketones, ur NEGATIVE NEGATIVE mg/dL   Protein, ur NEGATIVE NEGATIVE mg/dL   Nitrite NEGATIVE NEGATIVE   Leukocytes,Ua NEGATIVE NEGATIVE  Urinalysis, Microscopic (reflex)     Status: None   Collection Time: 09/12/21  2:18 PM  Result Value Ref Range   RBC / HPF 0-5 0 - 5 RBC/hpf   WBC, UA 0-5 0 - 5 WBC/hpf   Bacteria, UA NONE SEEN NONE SEEN   Squamous Epithelial / LPF 6-10 0 - 5   US OB LESS THAN 14 WEEKS WITH OB TRANSVAGINAL  Result Date: 09/12/2021 CLINICAL DATA:  Vaginal bleeding, pain EXAM: OBSTETRIC <14 WK [place] AND TRANSVAGINAL OB US TECHNIQUE: Both transabdominal and transvaginal ultrasound examinations were performed for complete evaluation of the gestation as well as the maternal uterus, adnexal regions, and pelvic cul-de-sac. Transvaginal technique was performed to assess early pregnancy. COMPARISON:  None. FINDINGS: Intrauterine gestational sac: Single Yolk sac:  Visualized. Embryo:  Visualized. Cardiac Activity: Visualized. Heart Rate: 115 bpm MSD: 19.5 mm   6 w   6 d CRL:  2.0 mm  too early to estimate by crown-rump length Subchorionic hemorrhage:  None visualized. Maternal uterus/adnexae: IMPRESSION: Early intrauterine gestation. Fetal pole is seen with fetal heart rate 115 beats per minute. Fetal pole too small to accurately date at this time. Approximate gestational age [redacted] weeks 6 days by mean sac diameter. No acute complicating feature. Electronically Signed   By: Rolm Baptise M.D.   On: 09/12/2021 16:45     Assessment and Plan  --25 y.o. G3P1001 at 17w5dwith live IUP --FHT 115 bpm --Hgb 12.0, blood type B POS --Discharge home in stable condition  SDarlina Rumpf CNM 09/12/2021, 5:47 PM

## 2021-09-18 NOTE — L&D Delivery Note (Signed)
OB/GYN Faculty Practice Delivery Note ? ?Megan Silva is a 26 y.o. [id number] s/p VBAC at [redacted]w[redacted]d. She was admitted for PPROM and labor.  ? ?ROM: 4h 70m with meconium stained fluid ?Maximum Maternal Temperature: 100.3 ? ?Delivery Date/Time: 12/28/21 at 1541  ? ?Delivery: Called to room due to patient feeling more pain and urge to push. After the next few contractions, fetus delivered in frank breech presentation. Shoulders and head easily delivered. Baby then wrapped with a blanket and placed on maternal chest, heart rate noted. Cord clamped x 2 and cut by FOB under supervision. Minimal bleeding noted after delivery. Pitocin started. ? ?Will reassess at 1 hour if placenta remains in place. Plan to give 1000 mcg of rectal Cytotec if placenta not delivered in 1 hour after discussion with Dr. Roselie Awkward.  ? ?Placenta: Not yet delivered, will plan to send to pathology  ?Complications: None  ?Lacerations: None  ?EBL: 75 cc ?Analgesia: None  ? ?Infant: Living female, previable status. Discussed that baby is too early for resuscitative measures. Will discuss whether mom would like to pursue [place] genetic testing and/or autopsy after recovery.  ? ?Vilma Meckel, MD ?OB/GYN Fellow, Faculty Practice  ? ? ?

## 2021-09-26 ENCOUNTER — Encounter: Payer: Self-pay | Admitting: Internal Medicine

## 2021-09-26 ENCOUNTER — Other Ambulatory Visit (HOSPITAL_COMMUNITY): Payer: Self-pay

## 2021-09-26 ENCOUNTER — Other Ambulatory Visit: Payer: Self-pay

## 2021-09-26 ENCOUNTER — Ambulatory Visit: Payer: Medicaid Other | Admitting: Internal Medicine

## 2021-09-26 VITALS — BP 119/80 | HR 103 | Temp 98.2°F | Resp 28 | Ht 67.0 in | Wt 148.2 lb

## 2021-09-26 DIAGNOSIS — Z0000 Encounter for general adult medical examination without abnormal findings: Secondary | ICD-10-CM | POA: Insufficient documentation

## 2021-09-26 DIAGNOSIS — Z3A09 9 weeks gestation of pregnancy: Secondary | ICD-10-CM

## 2021-09-26 DIAGNOSIS — O24011 Pre-existing diabetes mellitus, type 1, in pregnancy, first trimester: Secondary | ICD-10-CM

## 2021-09-26 DIAGNOSIS — E1010 Type 1 diabetes mellitus with ketoacidosis without coma: Secondary | ICD-10-CM

## 2021-09-26 DIAGNOSIS — Z3490 Encounter for supervision of normal pregnancy, unspecified, unspecified trimester: Secondary | ICD-10-CM | POA: Insufficient documentation

## 2021-09-26 MED ORDER — INSULIN GLARGINE 100 UNIT/ML SOLOSTAR PEN
30.0000 [IU] | PEN_INJECTOR | Freq: Every day | SUBCUTANEOUS | 3 refills | Status: DC
  Filled 2021-09-26: qty 15, 50d supply, fill #0

## 2021-09-26 MED ORDER — INSULIN ASPART 100 UNIT/ML FLEXPEN
4.0000 [IU] | PEN_INJECTOR | Freq: Three times a day (TID) | SUBCUTANEOUS | 3 refills | Status: DC
  Filled 2021-09-26: qty 9, 75d supply, fill #0
  Filled 2021-10-24: qty 9, 75d supply, fill #1

## 2021-09-26 MED ORDER — INSULIN PEN NEEDLE 32G X 4 MM MISC
11 refills | Status: DC
  Filled 2021-09-26: qty 100, 25d supply, fill #0
  Filled 2021-10-24: qty 100, 25d supply, fill #1

## 2021-09-26 NOTE — Progress Notes (Signed)
CC: diabetes follow up  HPI:  [name] is a 26 y.o. female with a past medical history stated below and presents today for diabetes follow up. Please see problem based assessment and plan for additional details.  Past Medical History:  Diagnosis Date   Anxiety    Depression    DKA (diabetic ketoacidosis) ([place])    Dysuria 01/26/2020   GBS bacteriuria 05/27/2018   Headache    Preterm delivery 12/27/2020   11/2020: in the setting of untreated DKA. Scant or no prenatal care. 22-23wks based on neonate size. Unknown if pprom first or pprom during course of PTL.  Neonate passed. APGAR 0/0/0   Uncontrolled type 1 diabetes mellitus with hyperglycemia ([facility])     Current Outpatient Medications on File Prior to Visit  Medication Sig Dispense Refill   Accu-Chek Softclix Lancets lancets USE ONE LANCET TO CHECK BLOOD SUGAR FOUR TIMES DAILY AS DIRECTED 100 each 11   amLODipine (NORVASC) 5 MG tablet Take 1 tablet (5 mg total) by mouth daily. 42 tablet 0   BD PEN NEEDLE NANO 2ND GEN 32G X 4 MM MISC USE ONE PEN NEEDLE TO CHECK BLOOD SUGAR AS DIRECTED 100 each 11   Blood Gluc Meter Disp-Strips (BLOOD GLUCOSE METER DISPOSABLE) DEVI Use one blood glucose strip to check blood sugar as directed 1 each 11   Blood Glucose Monitoring Suppl (ACCU-CHEK GUIDE) w/Device KIT USE AS DIRECTED TO CHECK BLOOD SUGAR AS DIRECTED 1 kit 11   glucose blood test strip USE AS DIRECTED TO CHECK BLOOD SUGARS FOUR TIMES DAILY 100 strip 11   insulin aspart (NOVOLOG) 100 UNIT/ML FlexPen INJECT 4 UNITS INTO THE SKIN THREE TIMES DAILY WITH MEALS. 15 mL 11   insulin aspart (NOVOLOG) 100 UNIT/ML FlexPen Inject 4 Units into the skin 3 (three) times daily with meals. 15 mL 11   insulin glargine (LANTUS) 100 UNIT/ML Solostar Pen INJECT 25 UNITS INTO THE SKIN DAILY. 15 mL 11   insulin glargine (LANTUS) 100 UNIT/ML Solostar Pen Inject 25 Units into the skin daily. 15 mL 11   Insulin Pen Needle (PEN NEEDLES 31GX5/16") 31G X 8 MM  MISC 1 Dose by Does not apply route 4 (four) times daily. Use 4 pen tips daily to inject insulin into the skin 100 each 2   Insulin Pen Needle 31G X 8 MM MISC USE AS DIRECTED FOUR TIMES DAILY. TO INJECT INSULIN INTO THE SKIN 360 each 1   Insulin Pen Needle 32G X 4 MM MISC USE ONE PEN NEEDLE TO CHECK BLOOD SUGAR AS DIRECTED 100 each 11   Lancets Misc. MISC Use one lancet to check blood sugar as directed 1 each 11   No current facility-administered medications on file prior to visit.    Family History  Problem Relation Age of Onset   Diabetes Maternal Grandmother    Cancer Paternal Grandmother     Social History: Currently pregnant. Lives in [place] with family, has significant other in [place]. Occasional marijuana use, no cigarette/other tobacco use (never smoker).  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  Review of Systems: ROS negative except for what is noted on the assessment and plan.  Vitals:   09/26/21 1338  BP: 119/80  Pulse: (!) 103  Resp: (!) 28  Temp: 98.2 F (36.8 C)  TempSrc: Oral  SpO2: 100%  Weight: 148 lb 3.2 oz (67.2 kg)  Height: 5' 7"  (1.702 m)    Physical Exam: General: Well appearing african [place] female, NAD HENT: normocephalic, atraumatic EYES:  conjunctiva non-erythematous, no scleral icterus CV: regular rate, normal rhythm, no murmurs, rubs, gallops.  No lower extremity edema. Pulmonary: normal work of breathing on RA, lungs clear to auscultation, no rales, wheezes, rhonchi Abdominal: gravid abdomen, soft, non-tender to palpation, normal BS Skin: Warm and dry, no rashes or lesions Neurological: MS: awake, alert and oriented x3, normal speech and fund of knowledge Motor: moves all extremities antigravity Psych: normal affect  Foot exam documented in chart.    Assessment & Plan:   See Encounters Tab for problem based charting.  Patient discussed with Dr.  Dorothea Ogle, M.D. [facility], PGY-1 Pager:  [phone] Date 09/26/2021 Time 2:55 PM

## 2021-09-26 NOTE — Assessment & Plan Note (Signed)
Plan: -Referral placed to ophthalmology for eye exam -Foot exam completed -Urine microalbumin completed -Pap smear to be completed at OB/GYN -Discuss vaccinations at next OV

## 2021-09-26 NOTE — Assessment & Plan Note (Signed)
Patient is G3 P1001, currently [redacted] weeks pregnant per dating from LMP.  Obstetric ultrasound confirmed intrauterine pregnancy on 12/26.  Patient has yet to decide if she would like to establish with [facility] or [facility] for this baby's prenatal care.  Reports she is currently taking an OTC prenatal vitamin with folate. UA 12/26 with trace hemoglobin, glucose >500, otherwise unremarkable with no signs of bacteriuria or UTI.  Today patient denies vaginal bleeding, cramping, vaginal fluid leak, dysuria, vaginal itching.  Patient is due for Pap exam, would like to have that done at OB/GYN visit.  Plan: -Continue prenatal vitamin -Establish care with desired OB/GYN for prenatal visit -Pap smear at OB/GYN visit

## 2021-09-26 NOTE — Assessment & Plan Note (Addendum)
Patient presents for T1DM follow-up.  Patient is currently pregnant and good blood sugar control will be crucial for health of mom and baby.  Patient is currently taking Lantus 25 units, NovoLog 8 units with meals, sliding scale 1-2 additional units.  Unclear from chart who prescribed higher NovoLog dose and SSI, was not in our last [facility] OV note. On glucometer check, high 488, low 56, average 226.  Patient is aware of when she has highs and lows, endorsing polydipsia/polyuria with highs and feeling hot and shaky with lows.  She reports lows are rare, though occur when she is having small meals or exercising alot.  Due for eye exam, foot exam, urine protein.  Patient was unable to get established with diabetes coordinator as of yet.  Denies burning/tingling/numbness and blurry vision.  Blood sugars currently uncontrolled on current regimen.  Patient on average is having elevated blood sugars to the 200s, though is also having symptomatic CBGs <70 following small meals.  At this time I would like to go back to 4 units NovoLog without sliding scale correction, and increase her long-acting insulin, in hopes of preventing intermittent lows and decreasing total average blood sugars.  Patient would benefit from establishing with our diabetes coordinator, continuous glucose monitoring would be very helpful in this patient who is currently pregnant.  Plan: -Increase Lantus to 30 units once daily -Go back to 4 units NovoLog mealtime without sliding scale -Urine protein today -Foot exam completed today -Referral to ophthalmology for eye exam -Referral to diabetes coordinator -Follow-up in March for hemoglobin A1c check if able to get in to see Ms. Lupita Leash Plyler, if patient is unable to get an appointment with diabetes coordinator prior to March, she was instructed to make an appointment sooner in late January early February.  ADDENDUM: Urine microalbumin/Cr ratio <4

## 2021-09-26 NOTE — Patient Instructions (Signed)
Thank you, [name] for allowing [place] to provide your care today. Today we discussed:  Type 1 diabetes mellitus: Thank you for bringing your glucose monitoring today.  It seems like your blood sugars are still quite high that you are having intermittent lows where you are feeling symptoms.  We would like you to switch your regimen to:  Lantus 30 units once daily NovoLog 4 units with meals, no sliding scale added insulin  I will also put in a referral to an eye doctor, their office will call you.  I also put in a referral for you to see our diabetic coordinator [name].  We will check urine protein today.  We completed a foot exam today.  Pregnancy: Glad that you are feeling well today.  Make sure to set up an appointment with your OB/GYN for prenatal visit.  Let them know that you had a confirmed intrauterine pregnancy on ultrasound on 12/26. Continue taking your prenatal vitamin with folate.  Let your OB/GYN know that you are also due for Pap smear and that you would like to get that done at their office.  Refrain from smoking.  I have ordered the following labs for you:   Lab Orders         Microalbumin / Creatinine Urine Ratio      My Chart Access: [email or web address]  Please follow-up in 2 months, however, if you do not hear back from [place] to schedule with [name] (diabetes coordinator), please call to set up an appointment sooner either at the end of January beginning of February so that we can see how your blood sugars are doing.  Please make sure to arrive 15 minutes prior to your next appointment. If you arrive late, you may be asked to reschedule.    We look forward to seeing you next time. Please call our clinic at [phone] if you have any questions or concerns. The best time to call is Monday-Friday from 9am-4pm, but there is someone available 24/7. If after hours or the weekend, call the main hospital number and ask for  the Internal Medicine Resident On-Call. If you need medication refills, please notify your pharmacy one week in advance and they will send [place] a request.   Thank you for letting us take part in your care. Wishing you the best!  Wayland Denis, MD 09/26/2021, 2:55 PM IM Resident, PGY-1

## 2021-09-26 NOTE — Progress Notes (Signed)
Internal Medicine Clinic Attending ? ?Case discussed with Dr. Zinoviev  At the time of the visit.  We reviewed the resident?s history and exam and pertinent patient test results.  I agree with the assessment, diagnosis, and plan of care documented in the resident?s note.  ?

## 2021-09-27 LAB — MICROALBUMIN / CREATININE URINE RATIO
Creatinine, Urine: 70.6 mg/dL
Microalb/Creat Ratio: <4 mg/g creat (ref 0–29)
Microalbumin, Urine: <3 ug/mL

## 2021-10-11 ENCOUNTER — Ambulatory Visit (INDEPENDENT_AMBULATORY_CARE_PROVIDER_SITE_OTHER): Payer: Medicaid Other | Admitting: Licensed Clinical Social Worker

## 2021-10-11 DIAGNOSIS — Z634 Disappearance and death of family member: Secondary | ICD-10-CM

## 2021-10-11 DIAGNOSIS — F331 Major depressive disorder, recurrent, moderate: Secondary | ICD-10-CM
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
## 2021-10-13 ENCOUNTER — Telehealth (INDEPENDENT_AMBULATORY_CARE_PROVIDER_SITE_OTHER): Payer: Medicaid Other

## 2021-10-13 DIAGNOSIS — Z3A00 Weeks of gestation of pregnancy not specified: Secondary | ICD-10-CM

## 2021-10-13 DIAGNOSIS — O0990 Supervision of high risk pregnancy, unspecified, unspecified trimester: Secondary | ICD-10-CM

## 2021-10-13 DIAGNOSIS — Z8759 Personal history of other complications of pregnancy, childbirth and the puerperium: Secondary | ICD-10-CM

## 2021-10-13 DIAGNOSIS — O09899 Supervision of other high risk pregnancies, unspecified trimester: Secondary | ICD-10-CM

## 2021-10-13 DIAGNOSIS — O24911 Unspecified diabetes mellitus in pregnancy, first trimester: Secondary | ICD-10-CM

## 2021-10-13 DIAGNOSIS — O24919 Unspecified diabetes mellitus in pregnancy, unspecified trimester: Secondary | ICD-10-CM

## 2021-10-13 MED ORDER — BLOOD PRESSURE MONITORING DEVI: 1.0000 | 0 refills | Status: AC

## 2021-10-13 NOTE — Progress Notes (Addendum)
New OB Intake  I connected with  Megan Silva on 10/13/21 at  9:15 AM EST by MyChart Video Visit and verified that I am speaking with the correct person using two identifiers. Nurse is located at [facility] and pt is located at home.  I discussed the limitations, risks, security and privacy concerns of performing an evaluation and management service by telephone and the availability of in person appointments. I also discussed with the patient that there may be a patient responsible charge related to this service. The patient expressed understanding and agreed to proceed.  I explained I am completing New OB Intake today. We discussed her EDD of 05/03/22 that is based on LMP of 07/27/21. Pt is G3/P1. I reviewed her allergies, medications, Medical/Surgical/OB history, and appropriate screenings. I informed her of [facility] services. Based on history, this is a/an  pregnancy complicated by gestational diabetes .   Patient Active Problem List   Diagnosis Date Noted   Intrauterine pregnancy 09/26/2021   Healthcare maintenance 09/26/2021   Elevated alkaline phosphatase level 12/30/2020   Postpartum hypertension 12/27/2020   Type 1 diabetes mellitus (HCC) 06/30/2019   Noncompliance with medication regimen    DKA (diabetic ketoacidoses) 06/24/2019   Diabetes mellitus complicating pregnancy 05/27/2018    Concerns addressed today  Delivery Plans:  Plans to deliver at [facility].   MyChart/Babyscripts MyChart access verified. I explained pt will have some visits in office and some virtually. Babyscripts instructions given and order placed. Patient verifies receipt of registration text/e-mail. Account successfully created and app downloaded.  Blood Pressure Cuff  Blood pressure cuff ordered for patient to pick-up from [employer]. Explained after first prenatal appt pt will check weekly and document in Babyscripts.  Weight scale: Patient does / does not  have weight scale. Weight scale ordered for  patient to pick up from [employer].   Anatomy US Explained first scheduled [place] will be around 19 weeks. Anatomy US scheduled for 12/06/21 at 1015a. Pt notified to arrive at 1000a.  Labs Discussed Avelina Laine genetic screening with patient. Would like both Panorama and Horizon drawn at new OB visit. Routine prenatal labs needed.  Covid Vaccine Patient has covid vaccine.   CenteringPregnancy Candidate?  If yes, offer as possibility  Mother/ Baby Dyad Candidate?    If yes, offer as possibility  Informed patient of [facility] website  and placed link in her AVS.   Social Determinants of Health Food Insecurity: Patient expresses food insecurity. Food Market information given to patient; explained patient may visit at the end of first OB appointment. WIC Referral: Patient is interested in referral to [facility].  Transportation: Patient denies transportation needs. Childcare: Discussed no children allowed at ultrasound appointments. Offered childcare services; patient declines childcare services at this time.  Send link to Pregnancy Navigators   Placed OB Box on problem list and updated  First visit review I reviewed new OB appt with pt. I explained she will have a pelvic exam, ob bloodwork with genetic screening, and PAP smear. Explained pt will be seen by Dr. Annia Friendly at first visit; encounter routed to appropriate provider. Explained that patient will be seen by pregnancy navigator following visit with provider. [facility] information placed in AVS.   Henrietta Dine, CMA 10/13/2021  9:30 AM

## 2021-10-13 NOTE — Patient Instructions (Signed)
  At our [facility], we work as an integrated team, providing care to address both physical and emotional health. Your medical provider may refer you to see our Behavioral Health Clinician (BHC) on the same day you see your medical provider, as availability permits; often scheduled virtually at your convenience.  Our BHC is available to all patients, visits generally last between 20-30 minutes, but can be longer or shorter, depending on patient need. The BHC offers help with stress management, coping with symptoms of depression and anxiety, major life changes , sleep issues, changing risky behavior, grief and loss, life stress, working on personal life goals, and  behavioral health issues, as these all affect your overall health and wellness.  The BHC is NOT available for the following: FMLA paperwork, court-ordered evaluations, specialty assessments (custody or disability), letters to employers, or obtaining certification for an emotional support animal. The BHC does not provide long-term therapy. You have the right to refuse integrated behavioral health services, or to reschedule to see the BHC at a later date.  Confidentiality exception: If it is suspected that a child or disabled adult is being abused or neglected, we are required by law to report that to either Child Protective Services or Adult Protective Services.  If you have a diagnosis of Bipolar affective disorder, Schizophrenia, or recurrent Major depressive disorder, we will recommend that you establish care with a psychiatrist, as these are lifelong, chronic conditions, and we want your overall emotional health and medications to be more closely monitored. If you anticipate needing extended maternity leave due to mental health issues postpartum, it it recommended you inform your medical provider, so we can put in a referral to a psychiatrist as soon as possible. The BHC is unable to recommend an extended maternity leave for mental  health issues. Your medical provider or BHC may refer you to a therapist for ongoing, traditional therapy, or to a psychiatrist, for medication management, if it would benefit your overall health. Depending on your insurance, you may have a copay or be charged a deductible, depending on your insurance, to see the BHC. If you are uninsured, it is recommended that you apply for financial assistance. (Forms may be requested at the front desk for in-person visits, via MyChart, or request a form during a virtual visit).  If you see the BHC more than 6 times, you will have to complete a comprehensive clinical assessment interview with the BHC to resume integrated services.  For virtual visits with the BHC, you must be physically in the state of [place] at the time of the visit. For example, if you live in [place], you will have to do an in-person visit with the BHC, and your out-of-state insurance may not cover behavioral health services in [place]. If you are going out of the state or country for any reason, the BHC may see you virtually when you return to [place], but not while you are physically outside of [place].    

## 2021-10-24 ENCOUNTER — Telehealth: Payer: Self-pay | Admitting: *Deleted

## 2021-10-24 ENCOUNTER — Other Ambulatory Visit: Payer: Self-pay

## 2021-10-24 ENCOUNTER — Other Ambulatory Visit (HOSPITAL_COMMUNITY)
Admission: RE | Admit: 2021-10-24 | Discharge: 2021-10-24 | Disposition: A | Discharge status: Home / Self Care | Payer: Medicaid Other | Source: Ambulatory Visit | Attending: Family Medicine | Admitting: Family Medicine

## 2021-10-24 ENCOUNTER — Encounter: Payer: Self-pay | Admitting: Family Medicine

## 2021-10-24 ENCOUNTER — Ambulatory Visit (INDEPENDENT_AMBULATORY_CARE_PROVIDER_SITE_OTHER): Payer: Medicaid Other | Admitting: Family Medicine

## 2021-10-24 ENCOUNTER — Encounter: Payer: Self-pay | Admitting: *Deleted

## 2021-10-24 ENCOUNTER — Other Ambulatory Visit (HOSPITAL_COMMUNITY): Payer: Self-pay

## 2021-10-24 VITALS — BP 129/79 | HR 105 | Wt 151.5 lb

## 2021-10-24 DIAGNOSIS — O0990 Supervision of high risk pregnancy, unspecified, unspecified trimester: Secondary | ICD-10-CM | POA: Insufficient documentation

## 2021-10-24 DIAGNOSIS — E1065 Type 1 diabetes mellitus with hyperglycemia: Secondary | ICD-10-CM

## 2021-10-24 DIAGNOSIS — Z8759 Personal history of other complications of pregnancy, childbirth and the puerperium: Secondary | ICD-10-CM

## 2021-10-24 DIAGNOSIS — F4321 Adjustment disorder with depressed mood: Secondary | ICD-10-CM

## 2021-10-24 DIAGNOSIS — Z98891 History of uterine scar from previous surgery: Secondary | ICD-10-CM

## 2021-10-24 DIAGNOSIS — Z8679 Personal history of other diseases of the circulatory system: Secondary | ICD-10-CM

## 2021-10-24 DIAGNOSIS — Z634 Disappearance and death of family member: Secondary | ICD-10-CM

## 2021-10-24 DIAGNOSIS — E1010 Type 1 diabetes mellitus with ketoacidosis without coma: Secondary | ICD-10-CM

## 2021-10-24 MED ORDER — INSULIN ASPART 100 UNIT/ML FLEXPEN: 4.0000 [IU] | PEN_INJECTOR | Freq: Three times a day (TID) | SUBCUTANEOUS | 3 refills | Status: DC

## 2021-10-24 MED ORDER — LEVEMIR FLEXPEN 100 UNIT/ML ~~LOC~~ SOPN: 15.0000 [IU] | PEN_INJECTOR | Freq: Two times a day (BID) | SUBCUTANEOUS | 1 refills | Status: DC

## 2021-10-24 MED ORDER — INSULIN PEN NEEDLE 32G X 4 MM MISC: 11 refills | Status: AC

## 2021-10-24 MED ORDER — ASPIRIN EC 81 MG PO TBEC: 81.0000 mg | DELAYED_RELEASE_TABLET | Freq: Every day | ORAL | 11 refills | Status: DC

## 2021-10-24 NOTE — Telephone Encounter (Signed)
Received a phone call from Babyscripts that patient reported bp of 143/80 and c/o headache, nausea. Per chart review pt is [redacted]w[redacted]d and has new  ob scheduled this afternoon.  HX Diabetes, postpartum hypertension. I called [facility] and she has not repeated her blood pressure. She states she took 1 extra strength tylenol and it helped her headache some, not completely. I advised her per instructions on package she can take second tylenol if needed- follow instructions on package. I asked her to bring bp cuff with her so we can verify it works correctly and we will recheck at her appointment today. She verifies she plans to come today. I also will inform Dr Annia Friendly whom she will see today. Brianny Soulliere,RN

## 2021-10-24 NOTE — Progress Notes (Signed)
Unable to find FHR, will use bedside US.

## 2021-10-24 NOTE — Progress Notes (Signed)
History:   Megan Silva is a 26 y.o. G3P1001 at 65w6dby LMP (c/s with early [place] being seen today for her first obstetrical visit. Patient does intend to breast feed. Pregnancy history fully reviewed. Medical history includes type I diabetes and previous postpartum hypertension on norvasc (not on BP meds currently).   Type I diabetic:  Diagnosed around age [age]per patient (2015)  Previously followed with an endocrinologist a few years ago in [place] but moved so no longer following with Endocrinology. Seeing the [facility] Medicine Clinic for control.  A1c 13% in 08/2021. It has been >13% since 2020, and still not under control prior to that. Several previous episodes of DKA.  Currently uses lantus 30U (just increased in 09/2021 by PCP) with 4 U novolog with meals and sometimes doing SSI based on carbs. She endorses daily compliance to her medication.   CBGs usually in the 200's fasting and with meals  Denies any current N/V/abdominal pain. Feels at baseline.   History of postpartum hypertension: Previously on Norvasc after last delivery, has not been on any medications. BP WNL today.    She also has a history of PPROM at ~ [redacted] weeks gestation on 12/11/2020 in the setting of DKA with fetal demise. She had a C/S in 2017 at 37 weeks d/t suspected LGA infant. She is still tearful thinking about her delivery in 2022, following with a counselor on a regular basis.      HISTORY: OB History  Gravida Para Term Preterm AB Living  3 2 1  0 0 1  SAB IAB Ectopic Multiple Live Births  0 0 0 0 1    # Outcome Date GA Lbr Len/2nd Weight Sex Delivery Anes PTL Lv  3 Current           2 Para 12/11/20     VBAC None  FD     Name: BNATALIE, MCEUENFD     Apgar1: 0  Apgar5: 0  1 Term 07/06/18 376w0d3:28 / 01:37 9 lb 11.4 oz (4.405 kg) M CS-LTranv Spinal  LIV     Birth Comments: LGA     Name: Carusone,BOY Jolina     Apgar1: 8  Apgar5: 9     Past Medical History:  Diagnosis Date   Anxiety     Depression    DKA (diabetic ketoacidoses) 06/24/2019   [place]   Dysuria 01/26/2020   Elevated alkaline phosphatase level 12/30/2020   GBS bacteriuria 05/27/2018   Headache    Hypertension    Preterm delivery 12/27/2020   11/2020: in the setting of untreated DKA. Scant or no prenatal care. 22-23wks based on neonate size. Unknown if pprom first or pprom during course of PTL.  Neonate passed. APGAR 0/0/0   Uncontrolled type 1 diabetes mellitus with hyperglycemia ([facility]   Past Surgical History:  Procedure Laterality Date   CESAREAN SECTION N/A 07/06/2018   Procedure: CESAREAN SECTION;  Surgeon: ErChancy MilroyMD;  [facility]: Obst[facility]s;  Laterality: N/A;   WISDOM TOOTH EXTRACTION     Family History  Problem Relation Age of Onset   Diabetes Maternal Grandmother    Cancer Paternal Grandmother    Social History   Tobacco Use   Smoking status: Never   Smokeless tobacco: Never  Vaping Use   Vaping Use: Never used  Substance Use Topics   Alcohol use: Yes    Comment: social   Drug use: Not Currently  Types: Marijuana   No Known Allergies Current Outpatient Medications on File Prior to Visit  Medication Sig Dispense Refill   BD PEN NEEDLE NANO 2ND GEN 32G X 4 MM MISC USE ONE PEN NEEDLE TO CHECK BLOOD SUGAR AS DIRECTED 100 each 11   Blood Gluc Meter Disp-Strips (BLOOD GLUCOSE METER DISPOSABLE) DEVI Use one blood glucose strip to check blood sugar as directed 1 each 11   Blood Glucose Monitoring Suppl (ACCU-CHEK GUIDE) w/Device KIT USE AS DIRECTED TO CHECK BLOOD SUGAR AS DIRECTED 1 kit 11   Blood Pressure Monitoring DEVI 1 each by Does not apply route once a week. 1 each 0   glucose blood test strip USE AS DIRECTED TO CHECK BLOOD SUGARS FOUR TIMES DAILY 100 strip 11   Lancets Misc. MISC Use one lancet to check blood sugar as directed 1 each 11   Prenatal Vit-Fe Fumarate-FA (MULTIVITAMIN-PRENATAL) 27-0.8 MG TABS tablet Take 1  tablet by mouth daily at 12 noon.     ReliOn Ultra Thin Lancets MISC by Does not apply route.     No current facility-administered medications on file prior to visit.    Review of Systems Pertinent items noted in HPI and remainder of comprehensive ROS otherwise negative. Physical Exam:   Vitals:   10/24/21 1431  BP: 129/79  Pulse: (!) 105  Weight: 151 lb 8 oz (68.7 kg)     Bedside Ultrasound for FHR check: Viable intrauterine pregnancy with positive cardiac activity note, fetal heart rate appeared around 140 bpm Patient informed that the ultrasound is considered a limited obstetric ultrasound and is not intended to be a complete ultrasound exam.  Patient also informed that the ultrasound is not being completed with the intent of assessing for fetal or placental anomalies or any pelvic abnormalities.  Explained that the purpose of todays ultrasound is to assess for fetal heart rate.  Patient acknowledges the purpose of the exam and the limitations of the study.  Constitutional: Well-developed, well-nourished pregnant female in no acute distress.  HEENT: PERRLA Skin: normal color and turgor, no rash Cardiovascular: normal rate  Respiratory: normal effort throughout GI: Abd soft, non-tender MS: Extremities nontender, no edema, normal ROM Neurologic: Alert and oriented x 4.  GU: no CVA tenderness Pelvic: NEFG, moderate amount of white creamy discharge, no blood, cervix clean. Pap/swabs collected. Psych: Tearful, depressed mood and affect   Assessment:    Pregnancy: G3P1001 Patient Active Problem List   Diagnosis Date Noted   Supervision of high risk pregnancy, antepartum 10/13/2021   History of preterm premature rupture of membranes (PPROM) 10/13/2021   Intrauterine pregnancy 09/26/2021   Postpartum hypertension 12/27/2020   Type 1 diabetes mellitus ([place]) 06/30/2019   Noncompliance with medication regimen    Diabetes mellitus complicating pregnancy 09/98/3382     Plan:     1. Supervision of high risk pregnancy, antepartum Doing well overall. Start ASA. - Cytology - PAP - Genetic Screening - CBC/D/Plt+RPR+Rh+ABO+RubIgG... - Hemoglobin A1c - Culture, OB Urine - Cervicovaginal ancillary only( [place]) - Comprehensive metabolic panel - Protein / creatinine ratio, urine - TSH - aspirin EC 81 MG tablet; Take 1 tablet (81 mg total) by mouth daily. Swallow whole.  Dispense: 30 tablet; Refill: 11 - AMBULATORY REFERRAL TO [facility] - AMB referral to maternal fetal medicine  2. Type 1 diabetes mellitus with hyperglycemia (HCC) Poorly controlled for several years, likely multifactorial. Last A1c 13%. Switching to Levemir BID and will definitely need titration of her insulin in the  next several weeks. Will send a message to her PCP about management (vs through our office) until she is able to establish with Endocrinology. Eye exam referral already placed by PCP. Will need a fetal echo > 20 weeks.  - Hemoglobin A1c - Comprehensive metabolic panel - Protein / creatinine ratio, urine - TSH - insulin detemir (LEVEMIR FLEXPEN) 100 UNIT/ML FlexPen; Inject 15 Units into the skin 2 (two) times daily.  Dispense: 15 mL; Refill: 1 - Insulin Pen Needle 32G X 4 MM MISC; Use as directed to inject insulin.  Dispense: 100 each; Refill: 11 - insulin aspart (NOVOLOG) 100 UNIT/ML FlexPen; Inject 4 Units into the skin 3 (three) times daily with meals.  Dispense: 15 mL; Refill: 3 - AMB referral to maternal fetal medicine - Ambulatory referral to Endocrinology  3. History of preterm premature rupture of membranes (PPROM) Around 24 weeks with fetal demise, in the setting of suspected DKA at that time. She had a full term delivery prior to this with macrosomia. Spoke with Dr. Roselie Awkward regarding progesterone +/- cervical length, recommended holding off of any progesterone supplementation but obtain cervical length with anatomy scan.  - AMB referral to maternal fetal medicine  4.  Grief at loss of child Appropriate and provided supportive listening. Following with counseling.    5. History of postpartum hypertension BP 129/79 today, will watch closely. Concern for possible chronic hypertension. Baseline labs collected.    6. Encounter for pap smear  Performed today.   7. History of C-section  For presumed macrosomia at term, never labored. She is undecided, but planning on repeat C/S currently.    Initial labs drawn. Continue prenatal vitamins. Problem list reviewed and updated. Genetic Screening discussed: ordered. Ultrasound discussed; fetal anatomic survey: ordered. Anticipatory guidance about prenatal visits given including labs, ultrasounds, and testing. Discussed usage of Babyscripts and virtual visits as additional source of managing and completing prenatal visits in midst of coronavirus and pandemic.   Encouraged to complete MyChart Registration for her ability to review results, send requests, and have questions addressed.  The nature of [facility]Faculty Practice with multiple MDs and Advanced Practice Providers was explained to patient; also emphasized that residents, students are part of our team. Routine obstetric precautions reviewed. Encouraged to seek out care at office or emergency room [facility] preferred) for urgent and/or emergent concerns.   Darrelyn Hillock, DO Ob Fellow

## 2021-10-25 LAB — PROTEIN / CREATININE RATIO, URINE
Creatinine, Urine: 46.8 mg/dL
Protein, Ur: 7.3 mg/dL
Protein/Creat Ratio: 156 mg/g creat (ref 0–200)

## 2021-10-25 LAB — CERVICOVAGINAL ANCILLARY ONLY
Bacterial Vaginitis (gardnerella): POSITIVE — AB
Candida Glabrata: NEGATIVE
Candida Vaginitis: POSITIVE — AB
Chlamydia: NEGATIVE
Comment: NEGATIVE
Comment: NEGATIVE
Comment: NEGATIVE
Comment: NEGATIVE
Comment: NEGATIVE
Comment: NORMAL
Neisseria Gonorrhea: NEGATIVE
Trichomonas: NEGATIVE

## 2021-10-26 ENCOUNTER — Encounter: Payer: Self-pay | Admitting: Family Medicine

## 2021-10-26 DIAGNOSIS — Z98891 History of uterine scar from previous surgery: Secondary | ICD-10-CM | POA: Insufficient documentation

## 2021-10-26 LAB — CBC/D/PLT+RPR+RH+ABO+RUBIGG...
Basophils Absolute: 0.1 10*3/uL (ref 0.0–0.2)
Basos: 1 %
EOS (ABSOLUTE): 0.1 10*3/uL (ref 0.0–0.4)
Eos: 1 %
HCV Ab: <0.1 s/co ratio (ref 0.0–0.9)
HIV Screen 4th Generation wRfx: NONREACTIVE
Hematocrit: 36.7 % (ref 34.0–46.6)
Hemoglobin: 11.6 g/dL (ref 11.1–15.9)
Hepatitis B Surface Ag: NEGATIVE
Immature Grans (Abs): 0 10*3/uL (ref 0.0–0.1)
Immature Granulocytes: 0 %
Lymphocytes Absolute: 2.2 10*3/uL (ref 0.7–3.1)
Lymphs: 24 %
MCH: 24.8 pg — ABNORMAL LOW (ref 26.6–33.0)
MCHC: 31.6 g/dL (ref 31.5–35.7)
MCV: 79 fL (ref 79–97)
Monocytes Absolute: 0.6 10*3/uL (ref 0.1–0.9)
Monocytes: 6 %
Neutrophils Absolute: 6.3 10*3/uL (ref 1.4–7.0)
Neutrophils: 68 %
Platelets: 366 10*3/uL (ref 150–450)
RBC: 4.67 x10E6/uL (ref 3.77–5.28)
RDW: 14.3 % (ref 11.7–15.4)
RPR Ser Ql: NONREACTIVE
Rh Factor: POSITIVE
Rubella Antibodies, IGG: 1.87 index (ref >0.99)
WBC: 9.3 10*3/uL (ref 3.4–10.8)

## 2021-10-26 LAB — CYTOLOGY - PAP
Chlamydia: NEGATIVE
Comment: NEGATIVE
Comment: NEGATIVE
Comment: NEGATIVE
Comment: NORMAL
Diagnosis: NEGATIVE
Diagnosis: REACTIVE
High risk HPV: NEGATIVE
Neisseria Gonorrhea: NEGATIVE
Trichomonas: NEGATIVE

## 2021-10-26 LAB — COMPREHENSIVE METABOLIC PANEL
ALT: 6 IU/L (ref 0–32)
AST: 7 IU/L (ref 0–40)
Albumin/Globulin Ratio: 1.3 (ref 1.2–2.2)
Albumin: 4.2 g/dL (ref 3.9–5.0)
Alkaline Phosphatase: 59 IU/L (ref 44–121)
BUN/Creatinine Ratio: 20 (ref 9–23)
BUN: 11 mg/dL (ref 6–20)
Bilirubin Total: 0.4 mg/dL (ref 0.0–1.2)
CO2: 21 mmol/L (ref 20–29)
Calcium: 9.7 mg/dL (ref 8.7–10.2)
Chloride: 103 mmol/L (ref 96–106)
Creatinine, Ser: 0.56 mg/dL — ABNORMAL LOW (ref 0.57–1.00)
Globulin, Total: 3.2 g/dL (ref 1.5–4.5)
Glucose: 266 mg/dL — ABNORMAL HIGH (ref 70–99)
Potassium: 4.6 mmol/L (ref 3.5–5.2)
Sodium: 137 mmol/L (ref 134–144)
Total Protein: 7.4 g/dL (ref 6.0–8.5)
eGFR: 131 mL/min/{1.73_m2} (ref >59)

## 2021-10-26 LAB — AB SCR+ANTIBODY ID: Antibody Screen: POSITIVE — AB

## 2021-10-26 LAB — HEMOGLOBIN A1C
Est. average glucose Bld gHb Est-mCnc: 258 mg/dL
Hgb A1c MFr Bld: 10.6 % — ABNORMAL HIGH (ref 4.8–5.6)

## 2021-10-26 LAB — HCV INTERPRETATION

## 2021-10-26 LAB — TSH: TSH: 1.02 u[IU]/mL (ref 0.450–4.500)

## 2021-10-27 LAB — URINE CULTURE, OB REFLEX

## 2021-10-27 LAB — CULTURE, OB URINE

## 2021-11-01 ENCOUNTER — Other Ambulatory Visit: Payer: Self-pay | Admitting: Family Medicine

## 2021-11-01 ENCOUNTER — Ambulatory Visit (HOSPITAL_COMMUNITY): Payer: Medicaid Other | Admitting: Licensed Clinical Social Worker

## 2021-11-01 DIAGNOSIS — B379 Candidiasis, unspecified: Secondary | ICD-10-CM

## 2021-11-01 MED ORDER — TERCONAZOLE 0.4 % VA CREA: 1.0000 | TOPICAL_CREAM | Freq: Every day | VAGINAL | 0 refills | Status: DC

## 2021-11-08 ENCOUNTER — Other Ambulatory Visit: Payer: Self-pay

## 2021-11-08 ENCOUNTER — Encounter: Payer: Medicaid Other | Attending: Family Medicine | Admitting: Registered"

## 2021-11-08 ENCOUNTER — Ambulatory Visit (INDEPENDENT_AMBULATORY_CARE_PROVIDER_SITE_OTHER): Payer: Medicaid Other | Admitting: Registered"

## 2021-11-08 ENCOUNTER — Telehealth: Payer: Self-pay

## 2021-11-08 DIAGNOSIS — Z8759 Personal history of other complications of pregnancy, childbirth and the puerperium: Secondary | ICD-10-CM

## 2021-11-08 DIAGNOSIS — E1010 Type 1 diabetes mellitus with ketoacidosis without coma: Secondary | ICD-10-CM | POA: Diagnosis present

## 2021-11-08 DIAGNOSIS — O24911 Unspecified diabetes mellitus in pregnancy, first trimester: Secondary | ICD-10-CM

## 2021-11-08 NOTE — Progress Notes (Signed)
Patient was seen on 11/08/21 for T1D in Pregnancy self-management.   EDD 05/03/22; [redacted]w[redacted]d.   Date of T1D diagnosis: 2015 A1c: 10.6%  Previous pregnancy diabetes management: 2016-17 [place] team managing diabetes: Internal medicine & [facility]  Current medication:  Novolog 8 u + correction bolus tid  Levemir flexpen 15 u bid  Barrier to healthcare: food insecurity, living in hotel x1/ month, states on long waiting list for housing/shelter. Patient received food from [employer] today.  FBS >200. After starting Levemir last week FBS went down for 3 days: 77, 140, 80 mg/dL but went back of to >200. Pt not able to identify what may explain the change.  Hypoglycemia: Pt reports hypoglycemic sxs including waking up feeling hot, fast heart beat with CBG 77 mg/dL. Pt states drank a sip of juice, 15 min later BG was 100 and felt better.  Patient thinks 20 units of Levemir at night would help with the stability of blood sugar while sleeping. Provider may need to see more blood sugar data before making changes to insulin.  Dietary recall: * B: cereal or frozen breakfast sandwich, iced coffee or water  *Did not do a complete dietary recall after patient states her diet is limited due to living in a hotel. Pt states she would like to visit the food market while here today.  Education Topics covered: Importance of knowing how much carbs are consumed to evaluate if patient needs to adjust insulin Omnipod / CGM options  Plan:  Aim for consistent carbohydrate intake  (for pregnancy 3 Carbohydrate Choices per meal (45 grams) +/- 1 either way is recommended) Begin reading food labels for Total Carbohydrate of foods If OK with your MD, consider increaseing your activity level by walking, Arm Chair Exercises or other activity daily as tolerated. Be aware of hypoglycemic symptoms and treat with rule of 15.  In addition to checking blood sugar prior to meals, check 2  hours after and record on log sheet Bring Log Book/Sheet and meter to every medical appointment  Take medication as directed by MD  If you receive notification that the Omnipod5 and Dexcom G7 are at your pharmacy, please pick up and contact Diabetes Educator.  Patient instructed to monitor glucose levels: FBS: 80 - 95 mg/dl 2 hour: 80-120 mg/dl  Patient received the following handouts: Pre-existing diabetes in pregnancy Carbohydrate Counting List Blood glucose Log Sheet  Patient will be seen for follow-up in 2 weeks or as needed.

## 2021-11-08 NOTE — Telephone Encounter (Addendum)
-----   Message from Allayne Stack, DO sent at 11/07/2021  1:00 PM EST ----- Regarding: Add cervical length to anatomy US Hi ladies,   This patient has her anatomy US scheduled for later in March-- can we please add on a cervical length to that appointment please?   Thank you  Dr Annia Friendly  ---------------------------------------------------------- Called MFM to add cervical length. Order requested for transvaginal US, order placed. Will be able to be completed with anatomy US, no change to appt date/time.

## 2021-11-09 ENCOUNTER — Telehealth: Payer: Self-pay | Admitting: *Deleted

## 2021-11-09 ENCOUNTER — Encounter: Payer: Self-pay | Admitting: *Deleted

## 2021-11-09 NOTE — Patient Instructions (Addendum)
Plan:  Aim for consistent carbohydrate intake  (for pregnancy 3 Carbohydrate Choices per meal (45 grams) +/- 1 either way is recommended) Begin reading food labels for Total Carbohydrate of foods If OK with your MD, consider increaseing your activity level by walking, Arm Chair Exercises or other activity daily as tolerated. Be aware of hypoglycemic symptoms and treat with rule of 15.  In addition to checking blood sugar prior to meals, check 2 hours after and record on log sheet Bring Log Book/Sheet and meter to every medical appointment  Take medication as directed by MD  If you receive notification that the Omnipod5 and Dexcom G7 are at your pharmacy, please pick up and contact Diabetes Educator.

## 2021-11-09 NOTE — Telephone Encounter (Signed)
Addendum: Arlena called back and asked if I would call  her. I called Anisha and informed her of results and recommendations and she voices understanding. [id number]

## 2021-11-09 NOTE — Telephone Encounter (Signed)
Received results of natera Horizon indicating She is a silent carrier for Alpha-thalassemia. I called her mobile number and heard message she has a voice mailbox that has not been set up. I called her home number and heard message " we cannot complete this call at this time, please call back later".  Will send MyChart message. Nancy Fetter

## 2021-11-10 ENCOUNTER — Other Ambulatory Visit: Payer: Self-pay | Admitting: Obstetrics and Gynecology

## 2021-11-10 ENCOUNTER — Other Ambulatory Visit: Payer: Self-pay | Admitting: Lactation Services

## 2021-11-10 MED ORDER — DEXCOM G7 RECEIVER DEVI: 1.0000 | 0 refills | Status: AC | PRN

## 2021-11-10 MED ORDER — DEXCOM G7 SENSOR MISC: 1.0000 | 6 refills | Status: AC | PRN

## 2021-11-10 MED ORDER — OMNIPOD 5 DEXG7G6 PODS GEN 5 MISC: 1.0000 | 6 refills | Status: AC

## 2021-11-10 MED ORDER — OMNIPOD 5 DEXG7G6 INTRO GEN 5 KIT: 1.0000 | PACK | 0 refills | Status: AC

## 2021-11-10 NOTE — Progress Notes (Signed)
Ordered Omnipod 5 and Dexcom 7 per Heywood Bene, RD

## 2021-11-12 ENCOUNTER — Encounter: Payer: Self-pay | Admitting: Registered"

## 2021-11-14 DIAGNOSIS — E1065 Type 1 diabetes mellitus with hyperglycemia: Secondary | ICD-10-CM

## 2021-11-16 ENCOUNTER — Other Ambulatory Visit: Payer: Self-pay

## 2021-11-20 ENCOUNTER — Encounter: Payer: Self-pay | Admitting: Obstetrics and Gynecology

## 2021-11-20 DIAGNOSIS — D563 Thalassemia minor: Secondary | ICD-10-CM | POA: Insufficient documentation

## 2021-11-20 NOTE — Progress Notes (Signed)
? ?PRENATAL VISIT NOTE ? ?Subjective:  ?Megan Silva is a 26 y.o. G3P1001 at 65w4dbeing seen today for ongoing prenatal care.  She is currently monitored for the following issues for this high-risk pregnancy and has Diabetes mellitus complicating pregnancy; Type 1 diabetes mellitus ([place]ff[place]; Supervision of high risk pregnancy, antepartum; History of preterm premature rupture of membranes (PPROM); History of C-section; and Alpha thalassemia silent carrier on their problem list. ? ?Patient reports  green vaginal discharge .  Contractions: Not present. Vag. Bleeding: Small.  Movement: Present. Denies leaking of fluid.  ? ?The following portions of the patient's history were reviewed and updated as appropriate: allergies, current medications, past family history, past medical history, past social history, past surgical history and problem list.  ? ?Objective:  ? ?Vitals:  ? 11/21/21 1325  ?BP: 121/75  ?Pulse: 98  ?Weight: 152 lb 6.4 oz (69.1 kg)  ? ? ?Fetal Status: Fetal Heart Rate (bpm): 156   Movement: Present    ?BSUS done for patient reassurance. Reviewed limits of this [place] only to look into viability.  ? ?General:  Alert, oriented and cooperative. Patient is in no acute distress.  ?Skin: Skin is warm and dry. No rash noted.   ?Cardiovascular: Normal heart rate noted  ?Respiratory: Normal respiratory effort, no problems with respiration noted  ?Abdomen: Soft, gravid, appropriate for gestational age.  Pain/Pressure: Absent     ?Pelvic: Cervical exam deferred        ?Extremities: Normal range of motion.  Edema: None  ?Mental Status: Normal mood and affect. Normal behavior. Normal judgment and thought content.  ? ?Assessment and Plan:  ?Pregnancy: G3P1001 at 1109w4d1. History of C-section ?- Desires repeat c-section  ? ?2. History of preterm premature rupture of membranes (PPROM) ?- H/o PPROM at 2467 at this time IUFD based on my review of the delivery note [facility]. Prior delivery was c-section for macrosomia. Pt was in DKA at this time and A1C was ~13.  ?- CL to be done on 12/06/21 ? ?3. Supervision of high risk pregnancy, antepartum ?- Has anatomy scan scheduled for 3/21 ?- Previously antibody positive that was Anti-M and insignificant. We will still recheck today but then likely does not need to have followed  ?- Small amount of S. Aureus bacteriuria on screening culture. Recheck today  ?- Self swab for green discharge ?- Recommended AFP and higher risk for NTD. She declines at this time.  ?- Normal NIPS ? ?4. Type 1 diabetes mellitus with hyperglycemia (HCC) ?- See below ? ?5. Diabetes mellitus affecting pregnancy in first trimester ?- Fetal echo ordered - she is scheduled in March for this with Dr. CoFilbert Schilder- Recommended eye exam - we will get her scheduled ?- Last EKG for pt was in 2020 - We will send her to Dr. ToHarriet Masson ?- Baseline P/C ratio 156, Cr 0.56, LFTs wnl ?- Recommended ldASA ?- Current regimen Levemir 16bid, Novolog 8u plus correction for meals -- plan is to transition to Omnipod and Dexcom. She reports all sugars are in the 200s, she did not bring her log. Offered inpatient management to adjust CBGs. She will try to make arrangements to do so. In the interim will do Levemir 20 bid. We will call her on Wednesday to see how she is progressing.  ? ?6. Alpha thalassemia silent carrier ?- Recommended genetic testing. She was given to number to [facility] on partner  kit testing - she declines.  ? ?Preterm labor symptoms and general obstetric precautions including but not limited to vaginal bleeding, contractions, leaking of fluid and fetal movement were reviewed in detail with the patient. ?Please refer to After Visit Summary for other counseling recommendations.  ? ?No follow-ups on file. ? ?Future Appointments  ?Date Time Provider [place]  ?11/22/2021  3:15 PM [facility]-EDUCATION [facility] [place]  ?11/23/2021  9:00 AM Hermine Messick, LCSW [facility]  None  ?11/24/2021  1:15 PM Marianna Payment, MD [facility]  ?11/24/2021  2:15 PM Plyler, Chauncey Reading, RD [facility]  ?12/06/2021 10:00 AM [facility] NURSE [facility]  ?12/06/2021 10:15 AM [facility] US2 [facility]  ?12/06/2021 11:00 AM [facility] MD RM [facility]  ?12/29/2021  9:00 AM Hermine Messick, LCSW [facility] None  ? ? ?Radene Gunning, MD ?

## 2021-11-21 ENCOUNTER — Other Ambulatory Visit (HOSPITAL_COMMUNITY)
Admission: RE | Admit: 2021-11-21 | Discharge: 2021-11-21 | Disposition: A | Discharge status: Home / Self Care | Payer: Medicaid Other | Source: Ambulatory Visit | Attending: Obstetrics and Gynecology | Admitting: Obstetrics and Gynecology

## 2021-11-21 ENCOUNTER — Other Ambulatory Visit (HOSPITAL_COMMUNITY): Payer: Self-pay

## 2021-11-21 ENCOUNTER — Ambulatory Visit (INDEPENDENT_AMBULATORY_CARE_PROVIDER_SITE_OTHER): Payer: Medicaid Other | Admitting: Obstetrics and Gynecology

## 2021-11-21 ENCOUNTER — Other Ambulatory Visit: Payer: Self-pay

## 2021-11-21 VITALS — BP 121/75 | HR 98 | Wt 152.4 lb

## 2021-11-21 DIAGNOSIS — Z98891 History of uterine scar from previous surgery: Secondary | ICD-10-CM | POA: Diagnosis not present

## 2021-11-21 DIAGNOSIS — N898 Other specified noninflammatory disorders of vagina: Secondary | ICD-10-CM | POA: Insufficient documentation

## 2021-11-21 DIAGNOSIS — Z8759 Personal history of other complications of pregnancy, childbirth and the puerperium: Secondary | ICD-10-CM | POA: Diagnosis not present

## 2021-11-21 DIAGNOSIS — O26899 Other specified pregnancy related conditions, unspecified trimester: Secondary | ICD-10-CM | POA: Diagnosis not present

## 2021-11-21 DIAGNOSIS — O0990 Supervision of high risk pregnancy, unspecified, unspecified trimester: Secondary | ICD-10-CM | POA: Diagnosis not present

## 2021-11-21 DIAGNOSIS — E1065 Type 1 diabetes mellitus with hyperglycemia: Secondary | ICD-10-CM

## 2021-11-21 DIAGNOSIS — D563 Thalassemia minor: Secondary | ICD-10-CM

## 2021-11-21 DIAGNOSIS — Z5941 Food insecurity: Secondary | ICD-10-CM

## 2021-11-21 DIAGNOSIS — O24911 Unspecified diabetes mellitus in pregnancy, first trimester: Secondary | ICD-10-CM

## 2021-11-21 NOTE — Progress Notes (Signed)
Green discharge started on 3/5 ?

## 2021-11-21 NOTE — Addendum Note (Signed)
Addended by: Brita Romp E on: 11/21/2021 02:22 PM ? ? Modules accepted: Orders ? ?

## 2021-11-22 ENCOUNTER — Ambulatory Visit (INDEPENDENT_AMBULATORY_CARE_PROVIDER_SITE_OTHER): Payer: Medicaid Other | Admitting: Registered"

## 2021-11-22 ENCOUNTER — Encounter: Payer: Medicaid Other | Attending: Family Medicine | Admitting: Registered"

## 2021-11-22 DIAGNOSIS — E1010 Type 1 diabetes mellitus with ketoacidosis without coma: Secondary | ICD-10-CM | POA: Diagnosis present

## 2021-11-22 DIAGNOSIS — O24911 Unspecified diabetes mellitus in pregnancy, first trimester: Secondary | ICD-10-CM

## 2021-11-22 LAB — URINE CULTURE

## 2021-11-22 LAB — CERVICOVAGINAL ANCILLARY ONLY
Bacterial Vaginitis (gardnerella): NEGATIVE
Candida Glabrata: NEGATIVE
Candida Vaginitis: POSITIVE — AB
Chlamydia: NEGATIVE
Comment: NEGATIVE
Comment: NEGATIVE
Comment: NEGATIVE
Comment: NEGATIVE
Comment: NEGATIVE
Comment: NORMAL
Neisseria Gonorrhea: NEGATIVE
Trichomonas: NEGATIVE

## 2021-11-22 NOTE — Progress Notes (Signed)
CGM Training - Dexcom G6 ? ?Start time:1508    End time: 1600 ?Total time: 52 min ? ?[x]   Download Dexcom G6 and Clarity apps to phone ?[x]   Accept invite to share data through Clarity ?[x]   Getting to know device: ?Sensor:  ?2 hour warmup for new sensor/transmitter ?Waterproof ?Receiver: Phone vs reader. Turn on Location and bluetooth ?[x]   Setting up device (high alert  250, low alert 70  ) ?[x]   Setting alert profile: ?alarms are very loud. Also, not to panic if getting message that blood sugar is dropping ?[x]   Inserting sensor: ?After application it takes 2 hr initial warm up before first reading.  ?[x]   Ending sensor session ?[x]   Trouble shooting:  ?[]   Tape guide:  ?[x]   Insulin dosing from CGM readings ?[x]   Placement in preparing to start Omnipod ? ?Patient has Dexcom tech support and my contact information.  ?

## 2021-11-23 ENCOUNTER — Telehealth: Payer: Self-pay | Admitting: Lactation Services

## 2021-11-23 ENCOUNTER — Other Ambulatory Visit: Payer: Self-pay | Admitting: Lactation Services

## 2021-11-23 ENCOUNTER — Ambulatory Visit (HOSPITAL_COMMUNITY): Payer: Medicaid Other | Admitting: Licensed Clinical Social Worker

## 2021-11-23 MED ORDER — INSULIN LISPRO 100 UNIT/ML IJ SOLN: INTRAMUSCULAR | 6 refills | Status: AC

## 2021-11-23 MED ORDER — DEXCOM G6 TRANSMITTER MISC: 1.0000 | 3 refills | Status: AC

## 2021-11-23 MED ORDER — DEXCOM G6 SENSOR MISC: 1.0000 | 6 refills | Status: AC

## 2021-11-23 NOTE — Progress Notes (Signed)
Dexcom G6 ordered to replace Dexcom G7 as not covered by Patients insurance. Insulin ordered per Heywood Bene, RD ?

## 2021-11-23 NOTE — Telephone Encounter (Signed)
PA completed for Dexcom G6  transmitter and Sensor, approved.  ? ?Called Pharmacy to inform then that PA has been completed and approved. Olivia at the Pharmacy reports they did receive fax for approval.  ? ?Called patient and she did not answer. LM that prescriptions has been sent to your pharmacy and to check her My Chart message.  ?

## 2021-11-24 ENCOUNTER — Telehealth: Payer: Self-pay | Admitting: *Deleted

## 2021-11-24 ENCOUNTER — Other Ambulatory Visit: Payer: Self-pay

## 2021-11-24 ENCOUNTER — Encounter: Payer: Self-pay | Admitting: Internal Medicine

## 2021-11-24 ENCOUNTER — Telehealth: Payer: Self-pay

## 2021-11-24 ENCOUNTER — Encounter: Payer: Medicaid Other | Admitting: Dietician

## 2021-11-24 ENCOUNTER — Encounter: Payer: Medicaid Other | Admitting: Internal Medicine

## 2021-11-24 LAB — AB SCR+ANTIBODY ID: Antibody Screen: POSITIVE — AB

## 2021-11-24 LAB — ANTIBODY SCREEN

## 2021-11-24 NOTE — Telephone Encounter (Signed)
Left pt message to return call to office.  ?Seirra Kos,RN  ?

## 2021-11-24 NOTE — Telephone Encounter (Signed)
Call placed to patient for today's missed appt. No answer. Left message on VM requesting return call.  

## 2021-11-24 NOTE — Telephone Encounter (Signed)
-----   Message from Milas Hock, MD sent at 11/21/2021  2:09 PM EST ----- ?Regarding: INpatient admission ?Can you call her and see if she was able to make arrangements for inpatient admission for adjustment of her CBGs. She has been in the 200s and is a T1DM with history of DKA/  ? ?

## 2021-11-25 NOTE — Telephone Encounter (Signed)
Called pt and she did not answer.  Message left that I am following up on the possibility of hospital admission in order to get blood sugar under better control. I requested pt to please call us back and leave a message stating her ability for hospital admission or if she had questions/concerns.  ?

## 2021-11-29 ENCOUNTER — Telehealth: Payer: Self-pay | Admitting: Registered"

## 2021-11-29 NOTE — Telephone Encounter (Signed)
Will send MyChart message for instructions for Omnipod training visit tomorrow at 10:30 am ?

## 2021-11-30 NOTE — Telephone Encounter (Signed)
Per chart review, pt has been scheduled for diabetes education this AM. Called pt to follow up. Pt states she plans to come to that appt.  ?

## 2021-12-06 ENCOUNTER — Ambulatory Visit (HOSPITAL_BASED_OUTPATIENT_CLINIC_OR_DEPARTMENT_OTHER): Payer: Medicaid Other | Admitting: Obstetrics

## 2021-12-06 ENCOUNTER — Other Ambulatory Visit: Payer: Self-pay

## 2021-12-06 ENCOUNTER — Encounter: Payer: Self-pay | Admitting: *Deleted

## 2021-12-06 ENCOUNTER — Ambulatory Visit: Payer: Medicaid Other | Admitting: *Deleted

## 2021-12-06 ENCOUNTER — Other Ambulatory Visit: Payer: Self-pay | Admitting: *Deleted

## 2021-12-06 ENCOUNTER — Ambulatory Visit: Payer: Medicaid Other | Attending: Family Medicine

## 2021-12-06 VITALS — BP 125/76 | HR 88

## 2021-12-06 DIAGNOSIS — Z8759 Personal history of other complications of pregnancy, childbirth and the puerperium: Secondary | ICD-10-CM

## 2021-12-06 DIAGNOSIS — O09292 Supervision of pregnancy with other poor reproductive or obstetric history, second trimester: Secondary | ICD-10-CM | POA: Diagnosis present

## 2021-12-06 DIAGNOSIS — O24012 Pre-existing diabetes mellitus, type 1, in pregnancy, second trimester: Secondary | ICD-10-CM

## 2021-12-06 DIAGNOSIS — Z3A19 19 weeks gestation of pregnancy: Secondary | ICD-10-CM | POA: Diagnosis present

## 2021-12-06 DIAGNOSIS — O0990 Supervision of high risk pregnancy, unspecified, unspecified trimester: Secondary | ICD-10-CM | POA: Diagnosis present

## 2021-12-06 DIAGNOSIS — O24911 Unspecified diabetes mellitus in pregnancy, first trimester: Secondary | ICD-10-CM | POA: Insufficient documentation

## 2021-12-06 DIAGNOSIS — O24112 Pre-existing diabetes mellitus, type 2, in pregnancy, second trimester: Secondary | ICD-10-CM

## 2021-12-06 NOTE — Progress Notes (Deleted)
?Cardio-Obstetrics Clinic ? ?New Evaluation ? ?Date:  12/07/2021  ? ?Megan Silva, DOB [birth year], MRN [id number] ? ?PCP:  Dellia Cloud, MD ?  ?[facility] Providers ?Cardiologist:  None  ?Electrophysiologist:  None   { ?Click to update primary MD,subspecialty MD or APP then REFRESH:1}   ? ?Referring MD: Milas Hock, MD  ? ?Chief Complaint: DMI ? ?History of Present Illness:   ? ?Megan Silva is a 26 y.o. female [G3P1001] who is being seen today for the evaluation of high risk pregnancy due to DMI at the request of Milas Hock, MD.  ? ? ?Prior CV Studies Reviewed: ?The following studies were reviewed today: ?*** ? ?Past Medical History:  ?Diagnosis Date  ? Anxiety   ? Depression   ? DKA (diabetic ketoacidoses) 06/24/2019  ? DKA (diabetic ketoacidosis) (HCC)   ? Dysuria 01/26/2020  ? Elevated alkaline phosphatase level 12/30/2020  ? GBS bacteriuria 05/27/2018  ? Headache   ? Hypertension   ? Preterm delivery 12/27/2020  ? 11/2020: in the setting of untreated DKA. Scant or no prenatal care. 22-23wks based on neonate size. Unknown if pprom first or pprom during course of PTL.  Neonate passed. APGAR 0/0/0  ? Uncontrolled type 1 diabetes mellitus with hyperglycemia (HCC)   ? ? ?Past Surgical History:  ?Procedure Laterality Date  ? CESAREAN SECTION N/A 07/06/2018  ? Procedure: CESAREAN SECTION;  Surgeon: Hermina Staggers, MD;  Location: [facility] BIRTHING SUITES;  Service: Obstetrics;  Laterality: N/A;  ? WISDOM TOOTH EXTRACTION    ? { ?Click here to update PMH, PSH, OB Hx then refresh note  :1}  ? ?OB History   ? ? Gravida  ?3  ? Para  ?2  ? Term  ?1  ? Preterm  ?0  ? AB  ?0  ? Living  ?1  ?  ? ? SAB  ?0  ? IAB  ?0  ? Ectopic  ?0  ? Multiple  ?0  ? Live Births  ?1  ?   ?  ?  ?  { ?Click here to update OB Charting then refresh note  :1}  ? ? ?Current Medications: ?No outpatient medications have been marked as taking for the 12/09/21 encounter (Appointment) with Meriam Sprague, MD.  ?  ? ?Allergies:   Patient  has no known allergies.  ? ?Social History  ? ?Socioeconomic History  ? Marital status: Single  ?  Spouse name: Not on file  ? Number of children: Not on file  ? Years of education: Not on file  ? Highest education level: Not on file  ?Occupational History  ? Not on file  ?Tobacco Use  ? Smoking status: Never  ? Smokeless tobacco: Never  ?Vaping Use  ? Vaping Use: Never used  ?Substance and Sexual Activity  ? Alcohol use: Yes  ?  Comment: social  ? Drug use: Not Currently  ?  Types: Marijuana  ? Sexual activity: Yes  ?  Birth control/protection: None  ?Other Topics Concern  ? Not on file  ?Social History Narrative  ? Not on file  ? ?Social Determinants of Health  ? ?Financial Resource Strain: Not on file  ?Food Insecurity: Food Insecurity Present  ? Worried About Programme researcher, broadcasting/film/video in the Last Year: Sometimes true  ? Ran Out of Food in the Last Year: Sometimes true  ?Transportation Needs: No Transportation Needs  ? Lack of Transportation (Medical): No  ? Lack of Transportation (Non-Medical): No  ?  Physical Activity: Not on file  ?Stress: Not on file  ?Social Connections: Not on file  ?{ ?Click here to update SDOH then refresh :1}  ? ? ?Family History  ?Problem Relation Age of Onset  ? Diabetes Maternal Grandmother   ? Cancer Paternal Grandmother   ? { ?Click here to update FH then refresh note    :1}  ? ?ROS:   ?Please see the history of present illness.    ?*** ?All other systems reviewed and are negative. ? ? ?Labs/EKG Reviewed:   ? ?EKG:   ?EKG is *** ordered today.  The ekg ordered today demonstrates *** ? ?Recent Labs: ?10/24/2021: ALT 6; BUN 11; Creatinine, Ser 0.56; Hemoglobin 11.6; Platelets 366; Potassium 4.6; Sodium 137; TSH 1.020  ? ?Recent Lipid Panel ?No results found for: CHOL, TRIG, HDL, CHOLHDL, LDLCALC, LDLDIRECT ? ?Physical Exam:   ? ?VS:  LMP 07/27/2021 (Exact Date)    ? ?Wt Readings from Last 3 Encounters:  ?11/21/21 152 lb 6.4 oz (69.1 kg)  ?10/24/21 151 lb 8 oz (68.7 kg)  ?09/26/21 148 lb 3.2 oz  (67.2 kg)  ?  ? ?GEN: *** Well nourished, well developed in no acute distress ?HEENT: Normal ?NECK: No JVD; No carotid bruits ?LYMPHATICS: No lymphadenopathy ?CARDIAC: ***RRR, no murmurs, rubs, gallops ?RESPIRATORY:  Clear to auscultation without rales, wheezing or rhonchi  ?ABDOMEN: Soft, non-tender, non-distended ?MUSCULOSKELETAL:  No edema; No deformity  ?SKIN: Warm and dry ?NEUROLOGIC:  Alert and oriented x 3 ?PSYCHIATRIC:  Normal affect  ? ? ?Risk Assessment/Risk Calculators:   ?{ ?Click to calculate CARPREG II - THEN refresh note :1}  ?  ?{ ?Click to caclulate Mod WHO Class of CV Risk - THEN refresh note :1}  ?   ?{ ?Click for CHADS2VASc Score - THEN Refresh Note    :[id number]}  ?  ? ? ?ASSESSMENT & PLAN:   ? ?#High Risk Pregnancy: ?#Type I DM: ?-Check ECG ?There are no Patient Instructions on file for this visit. ? ? ?Dispo:  No follow-ups on file.  ? ?Medication Adjustments/Labs and Tests Ordered: ?Current medicines are reviewed at length with the patient today.  Concerns regarding medicines are outlined above.  ?Tests Ordered: ?No orders of the defined types were placed in this encounter. ? ?Medication Changes: ?No orders of the defined types were placed in this encounter. ?  ?

## 2021-12-07 NOTE — Progress Notes (Signed)
MFM Note ? ?Megan Silva was seen for a detailed fetal anatomy scan due to pregestational diabetes.  She was diagnosed with type 2 diabetes in 2015.  She has an Omnipod insulin pump and a Dexcom continuous glucose monitor in place.  Her hemoglobin A1c drawn earlier in her current pregnancy was 10.6%.  Her baseline PIH labs were all within normal limits and her baseline P/C ratio did not indicate significant proteinuria.  Her a.1ntibody screen was positive for anti-M antibodies. ? ?Her last pregnancy in March 2022 resulted in an IUFD at 24 weeks when she presented with diabetic ketoacidosis and PPROM.  Her hemoglobin A1c at that time was 13.1%. ? ?She denies any other significant past medical history and denies any problems in her current pregnancy.   ? ?She had a cell free DNA test earlier in her pregnancy which indicated a low risk for trisomy 48, 19, and 13. A female fetus is predicted.  ? ?She was informed that the fetal growth and amniotic fluid level were appropriate for her gestational age.  ? ?There were no obvious fetal anomalies noted on today's ultrasound exam.  However, today's exam was limited due to the fetal position and maternal body habitus. ? ?The patient was informed that anomalies may be missed due to technical limitations. If the fetus is in a suboptimal position or maternal habitus is increased, visualization of the fetus in the maternal uterus may be impaired. ? ?A transvaginal ultrasound performed today due to her prior PPROM showed a cervical length of 3.39 cm long without any signs of funneling.  The patient was reassured that her risk of a preterm delivery is low based on the normal cervical length visualized today. ? ?The following were discussed during today's consultation: ? ?Pregestational diabetes and pregnancy ? ?The implications and management of diabetes in pregnancy was discussed in detail with the patient.  ? ?She was advised to monitor her blood glucose values using the Dexcom  monitor.   ? ?She was advised that the goals for her blood glucose values are fasting values of 90-95 or less and two-hour postprandial values of 120 or less.   ? ?Should the majority of her blood glucose measurements be above these values, her insulin dose may have to be increased to help her achieve better glycemic control.  ? ?The patient was advised that getting her fingerstick values as close to these goals as possible would provide her with the most optimal obstetrical outcome and prevent another episode of diabetic ketoacidosis. ? ?We will continue to follow her with monthly growth ultrasounds.   ? ?Due to pregestational diabetes, she was referred to [facility] pediatric cardiology for a fetal echocardiogram. ? ?Weekly fetal testing should be started at around 32 weeks. ? ?The increased risk of polyhydramnios, fetal macrosomia, and preeclampsia associated with diabetes was also discussed.  She should continue taking a daily baby aspirin for preeclampsia prophylaxis. ? ?The patient was advised that delivery for well-controlled diabetes in pregnancy is usually recommended at around 39 weeks.  Delivery at 37 weeks may be considered should her glycemic control be poor. ? ?Positive anti-M antibodies ? ?As the anti-M antibodies are of the IgM subtype, these antibodies will not cross the placenta and therefore her fetus will not be affected by these antibodies. ? ?A follow-up exam was scheduled in 4 weeks to assess the fetal growth and to complete the views of the fetal anatomy.   ? ?The patient stated that all of her questions were answered today. ? ?  A total of 30 minutes was spent counseling and coordinating the care for this patient.  Greater than 50% of the time was spent in direct face-to-face contact. ?

## 2021-12-09 ENCOUNTER — Ambulatory Visit: Payer: Medicaid Other | Admitting: Cardiology

## 2021-12-14 ENCOUNTER — Encounter: Payer: Self-pay | Admitting: *Deleted

## 2021-12-19 ENCOUNTER — Encounter: Payer: Medicaid Other | Admitting: Obstetrics & Gynecology

## 2021-12-28 ENCOUNTER — Encounter (HOSPITAL_COMMUNITY): Payer: Self-pay | Admitting: Obstetrics & Gynecology

## 2021-12-28 ENCOUNTER — Inpatient Hospital Stay (HOSPITAL_COMMUNITY)
Admission: AD | Admit: 2021-12-28 | Discharge: 2021-12-30 | DRG: 805 | Disposition: A | Discharge status: Home / Self Care | Payer: Medicaid Other | Attending: Obstetrics and Gynecology | Admitting: Obstetrics and Gynecology

## 2021-12-28 DIAGNOSIS — Z3A22 22 weeks gestation of pregnancy: Secondary | ICD-10-CM

## 2021-12-28 DIAGNOSIS — O4290 Premature rupture of membranes, unspecified as to length of time between rupture and onset of labor, unspecified weeks of gestation: Secondary | ICD-10-CM | POA: Diagnosis present

## 2021-12-28 DIAGNOSIS — Z794 Long term (current) use of insulin: Secondary | ICD-10-CM | POA: Diagnosis not present

## 2021-12-28 DIAGNOSIS — Z98891 History of uterine scar from previous surgery: Secondary | ICD-10-CM

## 2021-12-28 DIAGNOSIS — O42013 Preterm premature rupture of membranes, onset of labor within 24 hours of rupture, third trimester: Secondary | ICD-10-CM

## 2021-12-28 DIAGNOSIS — Z148 Genetic carrier of other disease: Secondary | ICD-10-CM

## 2021-12-28 DIAGNOSIS — O34211 Maternal care for low transverse scar from previous cesarean delivery: Secondary | ICD-10-CM

## 2021-12-28 DIAGNOSIS — R109 Unspecified abdominal pain: Secondary | ICD-10-CM | POA: Diagnosis present

## 2021-12-28 DIAGNOSIS — Z9641 Presence of insulin pump (external) (internal): Secondary | ICD-10-CM | POA: Diagnosis present

## 2021-12-28 DIAGNOSIS — O99344 Other mental disorders complicating childbirth: Secondary | ICD-10-CM | POA: Diagnosis present

## 2021-12-28 DIAGNOSIS — E109 Type 1 diabetes mellitus without complications: Secondary | ICD-10-CM | POA: Diagnosis present

## 2021-12-28 DIAGNOSIS — D563 Thalassemia minor: Secondary | ICD-10-CM | POA: Diagnosis present

## 2021-12-28 DIAGNOSIS — O42912 Preterm premature rupture of membranes, unspecified as to length of time between rupture and onset of labor, second trimester: Secondary | ICD-10-CM | POA: Diagnosis present

## 2021-12-28 DIAGNOSIS — O99324 Drug use complicating childbirth: Secondary | ICD-10-CM

## 2021-12-28 DIAGNOSIS — O328XX0 Maternal care for other malpresentation of fetus, not applicable or unspecified: Secondary | ICD-10-CM | POA: Diagnosis present

## 2021-12-28 DIAGNOSIS — O34219 Maternal care for unspecified type scar from previous cesarean delivery: Secondary | ICD-10-CM | POA: Diagnosis present

## 2021-12-28 DIAGNOSIS — O0990 Supervision of high risk pregnancy, unspecified, unspecified trimester: Principal | ICD-10-CM

## 2021-12-28 DIAGNOSIS — O9279 Other disorders of lactation: Secondary | ICD-10-CM | POA: Diagnosis present

## 2021-12-28 DIAGNOSIS — Z8759 Personal history of other complications of pregnancy, childbirth and the puerperium: Secondary | ICD-10-CM

## 2021-12-28 DIAGNOSIS — O2402 Pre-existing diabetes mellitus, type 1, in childbirth: Secondary | ICD-10-CM | POA: Diagnosis present

## 2021-12-28 DIAGNOSIS — F430 Acute stress reaction: Secondary | ICD-10-CM | POA: Diagnosis present

## 2021-12-28 DIAGNOSIS — O24424 Gestational diabetes mellitus in childbirth, insulin controlled: Secondary | ICD-10-CM

## 2021-12-28 LAB — COMPREHENSIVE METABOLIC PANEL
ALT: 7 U/L (ref 0–44)
AST: 12 U/L — ABNORMAL LOW (ref 15–41)
Albumin: 2.7 g/dL — ABNORMAL LOW (ref 3.5–5.0)
Alkaline Phosphatase: 61 U/L (ref 38–126)
Anion gap: 7 (ref 5–15)
BUN: 6 mg/dL (ref 6–20)
CO2: 22 mmol/L (ref 22–32)
Calcium: 9 mg/dL (ref 8.9–10.3)
Chloride: 106 mmol/L (ref 98–111)
Creatinine, Ser: 0.58 mg/dL (ref 0.44–1.00)
GFR, Estimated: >60 mL/min (ref >60)
Glucose, Bld: 165 mg/dL — ABNORMAL HIGH (ref 70–99)
Potassium: 3.5 mmol/L (ref 3.5–5.1)
Sodium: 135 mmol/L (ref 135–145)
Total Bilirubin: 0.5 mg/dL (ref 0.3–1.2)
Total Protein: 7 g/dL (ref 6.5–8.1)

## 2021-12-28 LAB — CBC
HCT: 33.6 % — ABNORMAL LOW (ref 36.0–46.0)
Hemoglobin: 11.1 g/dL — ABNORMAL LOW (ref 12.0–15.0)
MCH: 26.5 pg (ref 26.0–34.0)
MCHC: 33 g/dL (ref 30.0–36.0)
MCV: 80.2 fL (ref 80.0–100.0)
Platelets: 418 10*3/uL — ABNORMAL HIGH (ref 150–400)
RBC: 4.19 MIL/uL (ref 3.87–5.11)
RDW: 13.6 % (ref 11.5–15.5)
WBC: 25.4 10*3/uL — ABNORMAL HIGH (ref 4.0–10.5)
nRBC: 0 % (ref 0.0–0.2)

## 2021-12-28 LAB — GLUCOSE, CAPILLARY: Glucose-Capillary: 200 mg/dL — ABNORMAL HIGH (ref 70–99)

## 2021-12-28 MED ORDER — SIMETHICONE 80 MG PO CHEW: 80.0000 mg | CHEWABLE_TABLET | ORAL | Status: DC | PRN

## 2021-12-28 MED ORDER — OXYCODONE-ACETAMINOPHEN 5-325 MG PO TABS: 1.0000 | ORAL_TABLET | ORAL | Status: DC | PRN

## 2021-12-28 MED ORDER — LACTATED RINGERS IV SOLN: 500.0000 mL | INTRAVENOUS | Status: DC | PRN

## 2021-12-28 MED ORDER — DIBUCAINE (PERIANAL) 1 % EX OINT: 1.0000 "application " | TOPICAL_OINTMENT | CUTANEOUS | Status: DC | PRN

## 2021-12-28 MED ORDER — SENNOSIDES-DOCUSATE SODIUM 8.6-50 MG PO TABS: 2.0000 | ORAL_TABLET | Freq: Every day | ORAL | Status: DC

## 2021-12-28 MED ORDER — WITCH HAZEL-GLYCERIN EX PADS: 1.0000 "application " | MEDICATED_PAD | CUTANEOUS | Status: DC | PRN

## 2021-12-28 MED ORDER — SOD CITRATE-CITRIC ACID 500-334 MG/5ML PO SOLN: 30.0000 mL | ORAL | Status: DC | PRN

## 2021-12-28 MED ORDER — OXYCODONE HCL 5 MG PO TABS: 5.0000 mg | ORAL_TABLET | ORAL | Status: DC | PRN

## 2021-12-28 MED ORDER — ONDANSETRON HCL 4 MG/2ML IJ SOLN: 4.0000 mg | Freq: Four times a day (QID) | INTRAMUSCULAR | Status: DC | PRN

## 2021-12-28 MED ORDER — OXYCODONE-ACETAMINOPHEN 5-325 MG PO TABS: 2.0000 | ORAL_TABLET | ORAL | Status: DC | PRN

## 2021-12-28 MED ORDER — FENTANYL CITRATE (PF) 100 MCG/2ML IJ SOLN
INTRAMUSCULAR | Status: AC
  Filled 2021-12-28: qty 2

## 2021-12-28 MED ORDER — ACETAMINOPHEN 325 MG PO TABS: 650.0000 mg | ORAL_TABLET | ORAL | Status: DC | PRN

## 2021-12-28 MED ORDER — LIDOCAINE HCL (PF) 1 % IJ SOLN: 30.0000 mL | INTRAMUSCULAR | Status: DC | PRN

## 2021-12-28 MED ORDER — DIPHENHYDRAMINE HCL 25 MG PO CAPS: 25.0000 mg | ORAL_CAPSULE | Freq: Four times a day (QID) | ORAL | Status: DC | PRN

## 2021-12-28 MED ORDER — ONDANSETRON HCL 4 MG/2ML IJ SOLN: 4.0000 mg | INTRAMUSCULAR | Status: DC | PRN

## 2021-12-28 MED ORDER — OXYTOCIN-SODIUM CHLORIDE 30-0.9 UT/500ML-% IV SOLN
2.5000 [IU]/h | INTRAVENOUS | Status: DC
  Filled 2021-12-28: qty 500

## 2021-12-28 MED ORDER — BUTORPHANOL TARTRATE 1 MG/ML IJ SOLN
1.0000 mg | Freq: Once | INTRAMUSCULAR | Status: AC
  Administered 2021-12-28: 1 mg via INTRAVENOUS
  Filled 2021-12-28: qty 1

## 2021-12-28 MED ORDER — OXYTOCIN BOLUS FROM INFUSION
333.0000 mL | Freq: Once | INTRAVENOUS | Status: AC
  Administered 2021-12-28: 333 mL via INTRAVENOUS

## 2021-12-28 MED ORDER — BENZOCAINE-MENTHOL 20-0.5 % EX AERO: 1.0000 "application " | INHALATION_SPRAY | CUTANEOUS | Status: DC | PRN

## 2021-12-28 MED ORDER — ONDANSETRON HCL 4 MG PO TABS: 4.0000 mg | ORAL_TABLET | ORAL | Status: DC | PRN

## 2021-12-28 MED ORDER — COCONUT OIL OIL: 1.0000 "application " | TOPICAL_OIL | Status: DC | PRN

## 2021-12-28 MED ORDER — IBUPROFEN 600 MG PO TABS
600.0000 mg | ORAL_TABLET | Freq: Four times a day (QID) | ORAL | Status: DC
  Administered 2021-12-28 – 2021-12-30 (×4): 600 mg via ORAL
  Filled 2021-12-28 (×4): qty 1

## 2021-12-28 MED ORDER — INSULIN PUMP
SUBCUTANEOUS | Status: DC
  Administered 2021-12-28 (×2): 6 via SUBCUTANEOUS
  Filled 2021-12-28: qty 1

## 2021-12-28 MED ORDER — PRENATAL MULTIVITAMIN CH: 1.0000 | ORAL_TABLET | Freq: Every day | ORAL | Status: DC

## 2021-12-28 MED ORDER — FENTANYL CITRATE (PF) 100 MCG/2ML IJ SOLN
100.0000 ug | Freq: Once | INTRAMUSCULAR | Status: AC
  Administered 2021-12-28: 100 ug via INTRAVENOUS

## 2021-12-28 MED ORDER — OXYCODONE HCL 5 MG PO TABS: 10.0000 mg | ORAL_TABLET | ORAL | Status: DC | PRN

## 2021-12-28 MED ORDER — LACTATED RINGERS IV SOLN: INTRAVENOUS | Status: DC

## 2021-12-28 NOTE — Progress Notes (Signed)
Assessed patient at bedside. Feeling pressure in her vaginal area. SVE performed, placenta palpated in vaginal canal. Gentle traction placed on cord and edge of placenta. Placenta then delivered intact. Sent to pathology. Minimal bleeding noted. Will continue to monitor. Stable for transfer to postpartum.  ? ?Evalina Field, MD ?

## 2021-12-28 NOTE — Progress Notes (Signed)
Talked with patient at bedside regarding options for Anora testing and autopsy of her baby. Patient stated that she "does not want to know" and declined further testing. All questions and concerns addressed.  ? ?Megan Field, MD  ?

## 2021-12-28 NOTE — Progress Notes (Addendum)
Inpatient Diabetes Program Recommendations ? ?AACE/ADA: New Consensus Statement on Inpatient Glycemic Control (2015) ? ?Target Ranges:  Prepandial:   less than 140 mg/dL ?     Peak postprandial:   less than 180 mg/dL (1-2 hours) ?     Critically ill patients:  140 - 180 mg/dL  ? ?Lab Results  ?Component Value Date  ? GLUCAP 69 (L) 08/28/2021  ? HGBA1C 10.6 (H) 10/24/2021  ? ? ?Review of Glycemic Control ? Latest Reference Range & Units 12/28/21 12:59  ?Glucose 70 - 99 mg/dL 622 (H)  ? ?Diabetes history: DM 1 ?Outpatient Diabetes medications: Insulin pump started in March 2023 ?Prior to pregnancy patient was taking Lantus 25 units daily and Novolog 4 units tid with meals ?Current orders for Inpatient glycemic control:  ?Insulin pump-omnipod with Dexcom sensor ? ?Inpatient Diabetes Program Recommendations:   ?Note q 4 hour CBG's ordered with insulin pump- May need to stop insulin pump if blood sugars drop <80 mg/dL.  If insulin pump stopped, patient will need IV insulin using EndoTool during labor.  She has history of Type 1 DM and therefore will need basal/bolus insulin ordered if insulin pump stopped.   ? ? Note that prior to pregnancy she was taking Lantus 25 units daily and Novolog 4 units tid with meals.  ? ?Thanks,  ?Beryl Meager, RN, BC-ADM ?Inpatient Diabetes Coordinator ?Pager [phone]  (8a-5p) ?

## 2021-12-28 NOTE — H&P (Signed)
Megan Silva is a 26 y.o. female presenting for abdominal and back pain and leaking vaginal fluid. Pain started this morning and has gush of fluid per vagina, called EMS. ?OB History   ? ? Gravida  ?3  ? Para  ?2  ? Term  ?1  ? Preterm  ?0  ? AB  ?0  ? Living  ?1  ?  ? ? SAB  ?0  ? IAB  ?0  ? Ectopic  ?0  ? Multiple  ?0  ? Live Births  ?1  ?   ?  ?  ? ?Past Medical History:  ?Diagnosis Date  ? Anxiety   ? Depression   ? DKA (diabetic ketoacidoses) 06/24/2019  ? DKA (diabetic ketoacidosis) (HCC)   ? Dysuria 01/26/2020  ? Elevated alkaline phosphatase level 12/30/2020  ? GBS bacteriuria 05/27/2018  ? Headache   ? Hypertension   ? Preterm delivery 12/27/2020  ? 11/2020: in the setting of untreated DKA. Scant or no prenatal care. 22-23wks based on neonate size. Unknown if pprom first or pprom during course of PTL.  Neonate passed. APGAR 0/0/0  ? Uncontrolled type 1 diabetes mellitus with hyperglycemia (HCC)   ? ?Past Surgical History:  ?Procedure Laterality Date  ? CESAREAN SECTION N/A 07/06/2018  ? Procedure: CESAREAN SECTION;  Surgeon: Hermina Staggers, MD;  Location: [facility] BIR[facility] SU[facility];  Service: Obstetrics;  Laterality: N/A;  ? WISDOM TOOTH EXTRACTION    ? ?Family History: family history includes Cancer in her paternal grandmother; Diabetes in her maternal grandmother. ?Social History:  reports that she has never smoked. She has never used smokeless tobacco. She reports current alcohol use. She reports that she does not currently use drugs after having used the following drugs: Marijuana. ? ? ?  ?Maternal Diabetes: Yes:  Diabetes Type:  Pre-pregnancy, Insulin/Medication controlled ?Genetic Screening: Normal ?Maternal Ultrasounds/Referrals: Normal ?Fetal Ultrasounds or other Referrals:  None ?Maternal Substance Abuse:  Yes:  Type: Marijuana ?Significant Maternal Medications:  Meds include: Other: insulin ?Significant Maternal Lab Results:  None ?Other Comments:   22 weeks PPROM breech ? ?Review of Systems ?Maternal  Medical History:  ?Reason for admission: Rupture of membranes and contractions.  ? ?Contractions: Onset was 1-2 hours ago.   ?Fetal activity: Perceived fetal activity is normal.   ?Prenatal Complications - Diabetes: type 2. ?Diabetes is managed by insulin pump.   ? ?  ?Blood pressure 129/69, pulse (!) 116, temperature 98.5 ?F (36.9 ?C), temperature source Oral, resp. rate 18, last menstrual period 07/27/2021, SpO2 100 %, unknown if currently breastfeeding. ?Maternal Exam:  ?Uterine Assessment: Contraction strength is moderate.  Abdomen: Patient reports no abdominal tenderness. Fetal presentation: breech ?Introitus: Normal vulva. Vagina is positive for vaginal discharge (gross ROM).  ?Ferning test: positive.  ?Amniotic fluid character: meconium stained. ? ?Physical Exam ?Vitals and nursing note reviewed. Exam conducted with a chaperone present.  ?Constitutional:   ?   Appearance: She is well-developed. She is not ill-appearing.  ?HENT:  ?   Head: Normocephalic and atraumatic.  ?Cardiovascular:  ?   Rate and Rhythm: Normal rate.  ?Pulmonary:  ?   Effort: Pulmonary effort is normal.  ?Abdominal:  ?   Palpations: Abdomen is soft.  ?Genitourinary: ?   General: Normal vulva.  ?   Vagina: Vaginal discharge (gross ROM) present.  ?Skin: ?   General: Skin is warm and dry.  ?Neurological:  ?   Mental Status: She is alert.  ?  ?Prenatal labs: ?ABO, Rh: B/Positive/-- (02/06 1525) ?  Antibody: Positive, See Final Results (03/06 1412) ?Rubella: 1.87 (02/06 1525) ?RPR: Non Reactive (02/06 1525)  ?HBsAg: Negative (02/06 1525)  ?HIV: Non Reactive (02/06 1525)  ?GBS:    ? ?Assessment/Plan: ?Admit to L&D. Bedside US breech footling. Advised that 22 weeks is expected to be pre-viable and I offered condolences. Exam by S. Weinhold digital vaginal no cervix felt.   ? ?Scheryl Darter ?12/28/2021, 1:03 PM ? ? ? ? ?

## 2021-12-28 NOTE — MAU Note (Signed)
Megan Silva is a 26 y.o. at [redacted]w[redacted]d here in MAU reporting: arrived by EMS, ? ROM.  Started leaking around 1120, sheet that was on the stretcher was soaked, +fern test.  Was having constant back pain prior to leaking.  Abd started cramping after leaking started. ? ?Onset of complaint: 0400 ?Pain score: back 8/10, abd  4 ?Vitals:  ? 12/28/21 1245  ?BP: 129/69  ?Pulse: (!) 116  ?Resp: 18  ?Temp: 98.5 ?F (36.9 ?C)  ?SpO2: 100%  ?   ?FHT:154 ?Lab orders placed from triage:   ?

## 2021-12-28 NOTE — Progress Notes (Signed)
Patient received to room 111, patient responding with nods but not verbalizing her responses. Patient instructed to call for her dinner tray.  ?

## 2021-12-28 NOTE — Discharge Summary (Shared)
? ?  Postpartum Discharge Summary ? ?Date of Service updated*** ? ?   ?Patient Name: Megan Silva ?DOB: [birth year] ?MRN: [id number] ? ?Date of admission: 12/28/2021 ?Delivery date:12/28/2021  ?Delivering provider: Genia Del  ?Date of discharge: 12/28/2021 ? ?Admitting diagnosis: Premature rupture of membranes [O42.90] ?Intrauterine pregnancy: [redacted]w[redacted]d    ?Secondary diagnosis:  Principal Problem: ?  VBAC (vaginal birth after Cesarean) ?Active Problems: ?  Type 1 diabetes mellitus ([facility] ?  Supervision of high risk pregnancy, antepartum ?  History of preterm premature rupture of membranes (PPROM) ?  History of C-section ?  Alpha thalassemia silent carrier ?  Premature rupture of membranes ? ?Additional problems: ***    ?Discharge diagnosis: Preterm Pregnancy Delivered                                              ?Post partum procedures:{Postpartum procedures:23558} ?Augmentation: N/A ?Complications: None ? ?Hospital course: Onset of Labor With Vaginal Delivery      ?26y.o. yo G3P1001 at 269w0das admitted in Latent Labor on 12/28/2021 after PPROM. She had an uncomplicated VBAC.  ?Membrane Rupture Time/Date: 11:20 AM ,12/28/2021   ?Delivery Method:VBAC, Spontaneous  ?Episiotomy: None  ?Lacerations:  None  ?Patient had an uncomplicated postpartum course. She is ambulating, tolerating a regular diet, passing flatus, and urinating well. Patient is discharged home in stable condition on 12/28/21. ? ?Newborn Data: ?Birth date:12/28/2021  ?Birth time:3:41 PM  ?Gender:  ?Living status:Living  ?Apgars: ,  ?Weight:  ? ?Magnesium Sulfate received: No ?BMZ received: No ?Rhophylac: N/A ?MMR: N/A ?Transfusion: No *** ? ?Physical exam  ?Vitals:  ? 12/28/21 1559 12/28/21 1605 12/28/21 1615 12/28/21 1630  ?BP: 131/79 126/72 123/74 125/71  ?Pulse: 99 91 99 100  ?Resp: 16     ?Temp: 100 ?F (37.8 ?C)     ?TempSrc: Axillary     ?SpO2:      ? ?General: {Exam; general:21111117} ?Lochia: {Desc;  appropriate/inappropriate:30686::"appropriate"} ?Uterine Fundus: {Desc; firm/soft:30687} ?Incision: {Exam; incision:21111123} ?DVT Evaluation: {Exam; dvJIR:6789381} ?Labs: ?Lab Results  ?Component Value Date  ? WBC 25.4 (H) 12/28/2021  ? HGB 11.1 (L) 12/28/2021  ? HCT 33.6 (L) 12/28/2021  ? MCV 80.2 12/28/2021  ? PLT 418 (H) 12/28/2021  ? ? ?  Latest Ref Rng & Units 12/28/2021  ? 12:59 PM  ?CMP  ?Glucose 70 - 99 mg/dL 165    ?BUN 6 - 20 mg/dL 6    ?Creatinine 0.44 - 1.00 mg/dL 0.58    ?Sodium 135 - 145 mmol/L 135    ?Potassium 3.5 - 5.1 mmol/L 3.5    ?Chloride 98 - 111 mmol/L 106    ?CO2 22 - 32 mmol/L 22    ?Calcium 8.9 - 10.3 mg/dL 9.0    ?Total Protein 6.5 - 8.1 g/dL 7.0    ?Total Bilirubin 0.3 - 1.2 mg/dL 0.5    ?Alkaline Phos 38 - 126 U/L 61    ?AST 15 - 41 U/L 12    ?ALT 0 - 44 U/L 7    ? ?Edinburgh Score: ? ?  12/27/2020  ?  9:35 AM  ?Edinburgh Postnatal Depression Scale Screening Tool  ?I have been able to laugh and see the funny side of things. 0  ?I have looked forward with enjoyment to things. 0  ?I have blamed myself unnecessarily when things went wrong. 2  ?I  have been anxious or worried for no good reason. 2  ?I have felt scared or panicky for no good reason. 0  ?Things have been getting on top of me. 0  ?I have been so unhappy that I have had difficulty sleeping. 2  ?I have felt sad or miserable. 0  ?I have been so unhappy that I have been crying. 2  ?The thought of harming myself has occurred to me. 0  ?Edinburgh Postnatal Depression Scale Total 8  ? ? ? ?After visit meds:  ?Allergies as of 12/28/2021   ?No Known Allergies ?  ?Med Rec must be completed prior to using this [facility]*** ? ? ? ? ? ? ? ?Discharge home in stable condition ?Infant Disposition:{CHL IP OB HOME WITH MOTHER:23581} ?Discharge instruction: per After Visit Summary and Postpartum booklet. ?Activity: Advance as tolerated. Pelvic rest for 6 weeks.  ?Diet: routine diet ?Future Appointments: ?Future Appointments  ?Date Time Provider  [place]  ?01/03/2022 12:30 PM [facility] NURSE [facility]  ?01/03/2022  1:00 PM [facility] US1 [facility]  ? ?Follow up Visit: ?Message sent to [facility] by Dr. Gwenlyn Perking on 12/28/21.  ? ?Please schedule this patient for a In person postpartum visit in 4 weeks with the following provider: Any provider. ?Additional Postpartum F/U: Postpartum Depression checkup  ?High risk pregnancy complicated by:  C7TV, hx of CS, hx of IUFD ?Delivery mode:  VBAC, Spontaneous  ?Anticipated Birth Control:  Unsure ? ?12/28/2021 ?Genia Del, MD ? ? ? ?

## 2021-12-29 ENCOUNTER — Ambulatory Visit (HOSPITAL_COMMUNITY): Payer: Medicaid Other | Admitting: Licensed Clinical Social Worker

## 2021-12-29 DIAGNOSIS — F430 Acute stress reaction: Secondary | ICD-10-CM

## 2021-12-29 LAB — GC/CHLAMYDIA PROBE AMP (~~LOC~~) NOT AT ARMC
Chlamydia: NEGATIVE
Comment: NEGATIVE
Comment: NORMAL
Neisseria Gonorrhea: NEGATIVE

## 2021-12-29 LAB — RPR: RPR Ser Ql: NONREACTIVE

## 2021-12-29 MED ORDER — INSULIN ASPART 100 UNIT/ML IJ SOLN
4.0000 [IU] | Freq: Three times a day (TID) | INTRAMUSCULAR | Status: DC
  Administered 2021-12-29 – 2021-12-30 (×2): 4 [IU] via SUBCUTANEOUS

## 2021-12-29 MED ORDER — INSULIN ASPART 100 UNIT/ML IJ SOLN
0.0000 [IU] | INTRAMUSCULAR | Status: DC
  Administered 2021-12-29: 2 [IU] via SUBCUTANEOUS
  Administered 2021-12-29: 3 [IU] via SUBCUTANEOUS
  Administered 2021-12-30: 5 [IU] via SUBCUTANEOUS
  Administered 2021-12-30: 2 [IU] via SUBCUTANEOUS

## 2021-12-29 MED ORDER — LORAZEPAM 2 MG/ML IJ SOLN: 2.0000 mg | Freq: Once | INTRAMUSCULAR | Status: DC

## 2021-12-29 MED ORDER — LORAZEPAM 2 MG/ML IJ SOLN
2.0000 mg | Freq: Once | INTRAMUSCULAR | Status: DC
  Filled 2021-12-29: qty 1

## 2021-12-29 MED ORDER — INSULIN GLARGINE-YFGN 100 UNIT/ML ~~LOC~~ SOLN
20.0000 [IU] | SUBCUTANEOUS | Status: DC
  Administered 2021-12-29: 20 [IU] via SUBCUTANEOUS
  Filled 2021-12-29 (×2): qty 0.2

## 2021-12-29 NOTE — Progress Notes (Signed)
Post Partum Day 1 ?Subjective: ?Patient respond to questions without verbalizing and only minimal head nods and had covers pulled up to her face ? ?Objective: ?Blood pressure (!) 121/59, pulse 80, temperature 97.8 ?F (36.6 ?C), temperature source Oral, resp. rate 15, last menstrual period 07/27/2021, SpO2 97 %, unknown if currently breastfeeding. ? ?Physical Exam:  ?General: alert and as above  ?Lochia: appropriate ?Uterine Fundus: firm ?DVT Evaluation: No evidence of DVT seen on physical exam. ? ?Recent Labs  ?  12/28/21 ?1259  ?HGB 11.1*  ?HCT 33.6*  ? ?CBG (last 3)  ?Recent Labs  ?  12/28/21 ?2332  ?GLUCAP 200*  ? ? ?Assessment/Plan: ?Needs mental health assessment in house- SW and Dr. Lovette Cliche have been notified ?Diabetes management with her current insulin pump ? LOS: 1 day  ? ?Megan Silva ?12/29/2021, 10:48 AM  ? ? ?

## 2021-12-29 NOTE — Progress Notes (Addendum)
Inpatient Diabetes Program Recommendations ? ?AACE/ADA: New Consensus Statement on Inpatient Glycemic Control  ? ?Target Ranges:  Prepandial:   less than 140 mg/dL ?     Peak postprandial:   less than 180 mg/dL (1-2 hours) ?     Critically ill patients:  140 - 180 mg/dL  ? ? Latest Reference Range & Units 12/28/21 23:32  ?Glucose-Capillary 70 - 99 mg/dL 654 (H)  ? ? Latest Reference Range & Units 12/28/21 12:59  ?Glucose 70 - 99 mg/dL 650 (H)  ? ?Review of Glycemic Control ? ?Diabetes history: DM1 (makes NO insulin; requires basal, correction, and carb coverage insulin) ?Outpatient Diabetes medications: Insulin Pump with Dexcom CGM ?Current orders for Inpatient glycemic control: Insulin Pump Q4H ? ?Inpatient Diabetes Program Recommendations:   ? ?Insulin: Recommend removing insulin pump and order Semglee 20 units Q24H, CBGs Q4H, Novolog 0-9 units Q4H. If patient starts eating, please consider ordering Novolog 4 units TID with meals for meal coverage. ? ?NOTE: Patient has Type 1 DM and uses an insulin pump for DM control. Per chart, patient was started on insulin pump in March 2023 and prior to pregnancy she was taking Lantus 25 units daily and Novolog 4  units TID with meals. Patient was [redacted] weeks pregnant and came in PPROM and labor. Patient now grieving loss of infant. Patient has insulin pump on and is assumed to be using it for DM control at this time. Per notes in the chart, patient is not allowing staff to check glucose and she is not responding verbally to staff.  Went to speak with patient to get more information on insulin pump and discuss DM control. Patient lying in bed with cell phone in hand and appeared to be texting on her phone. Introduced self and offered condolences on her loss. Asked patient if I could see her insulin pump or if she could provide any information regarding or insulin pump or glucose. Patient would not make eye contact and continued to hold her cell phone in hand and stare at screen.   Expressed concern about DM control and that she has a lot to process right now and we want to ensure her DM is controlled. Stayed in room about 5 minutes talking with patient at times and providing therapeutic silence as well. Patient never responded at all to any questions, not even with a nodding of her head. Spoke with Addy, RN regarding concern about patient being on insulin pump and she would not respond to me at all (verbally nor nonverbally). Addy, RN reports that she was concerned as well and that SW has talked with patient and she had already consulted psych. Addy, RN and myself went back in room together to talk with patient. Patient lying on side facing the wall with her phone in hand.  Asked patient where her insulin pump was on her at and she did not respond and she would not point to where it was located either. Asked patient if she could allow [place] to look at her pump or her PDA device with pump settings and she did not respond at all. Explained that we were concerned about her DM and wanted to take that concern off of her right now and that we would like to remove her insulin pump and use SQ insulin injections for DM control. Aske patient if she would be agreeable to remove her insulin pump and allow [place] to use SQ insulin and patient shook her head side to side to indicate "  NO" that she did not want to remove her insulin pump. Patient has PDA device on bedside table. Asked patient if we could look at her insulin pump PDA but she did not respond. Picked up PDA from bedside table and asked patient for PIN number to access settings. She did not respond verbally and did not make any effort to put her PIN number in when asked to do so. Patient would not respond to any questions verbally or nonverbally at all other than when she indicated she did not want to remove her insulin pump.  Addy, RN and I left room. Dr. Gasper Sells (Psych) came to unit. Addy, RN, Dr. Gasper Sells, and myself discussed patient  situation and concerns about safety. At this point, due to concern about patient's mental health, grieving, and for safety of diabetes management,  it would be recommended that the insulin pump be removed and use SQ insulin for DM control.  ? ?Addendum 12/29/21@13 :50-Patient now under IVC and needing to remove insulin pump and use SQ insulin for DM control. Spoke with patient along with Pam, RN regarding safety concerns regarding using insulin pump and that insulin pump would need to be removed. Patient was on the phone with her aunt and her aunt stayed on the phone and was able to listen and also encouraged patient to be cooperative. After lengthy discussion, patient reluctantly agreed and she removed the OmniPod insulin pump from her right arm. Patient will keep Dexcom CGM on (currently on her left arm) and she will inform staff of glucose readings from CGM. Explained to patient that we will use Semglee and Novolog correction and if she feels like she can eat we would recommend ordering Novolog meal coverage as well. Patient states she was prescribed Novolog 4 units with meals (prior to pregnancy) but she was taking Novolog 8 units with meals and glucose was still running high. Explained that if she is deemed safe and cleared from IVC, would could discuss getting her back on her insulin pump. Explained that patient would need to get family to bring pump supplies to the hospital once she is cleared to restart her insulin pump. Diabetes team will continue to follow along while inpatient. ? ?Thanks, ?Orlando Penner, RN, MSN, CDE ?Diabetes Coordinator ?Inpatient Diabetes Program ?[phone] (Team Pager from 8am to 5pm) ? ?

## 2021-12-29 NOTE — Progress Notes (Signed)
CSW acknowledged and completed chart review.  CSW attempted to meet with patient in room 111.  When CSW arrived, patient was sitting at the edge of bed staring a the floor.  CSW introduced self and explained CSW role.  Patient did not provide any contact with CSW.  CSW asked patient to confirm and DOB; MOB did not provide any information.  CSW verbalized concern for patient and patient continued to not respond.  CSW sat in therapeutic silence with patient and patient continued to not respond.  ? ?CSW provided RN with an update and recommended a psych consult.  ? ?Laurey Arrow, MSW, LCSW ?Clinical Social Work ?[phone] ? ?

## 2021-12-29 NOTE — Progress Notes (Addendum)
PT refusing 0800 vitals, physical assessment, and insulin pump check.  ?

## 2021-12-29 NOTE — Progress Notes (Signed)
Numerous attempts made to assess patient's cbg, vitals, and postpartum state. Patient withdrawn, avoiding eye contact. Patient refusing to engage with staff. Off going shift reported consistent concerns for patient's behavior.  ?Concerns voiced to patient.  ?MD and SW notified of concern for patient's immediate safety due to the extreme grief, withdrawn behavior, and access to insulin (insulin pump in place).  ?

## 2021-12-29 NOTE — Progress Notes (Signed)
RN and NT to bedside for morning rounds. Due to CGM results for blood sugar, RN asked NT to get a POCT blood glucose. Patient refused at this time. Fundal check refused as well. RN encouraged patient and family to call for any changes. MD aware. No new orders at this time.  ?

## 2021-12-29 NOTE — Progress Notes (Signed)
RN and Milinda Antis Diabetes coordinator in room to talk to patient and discuss her POC. Patient allowed to talk through her concerns about her IVC and being overwhelmed that she had so many people coming in to see her this morning. RN reassured her that she only had that many people coming to see her to provide her with the best care and to keep her safe. Patient reluctantly but willingly removed her insulin pump. Patient verbalized concern that her blood sugar will become out of control after removal of the insulin pump. RN reassured patient that we would manage her blood sugars with insulin until we could safely replace the insulin pump.  ?

## 2021-12-29 NOTE — Progress Notes (Signed)
CSW faxed completed IVC paperwork to [facility]. Magistrate sent back completed paperwork (Findings and Custody Order Involuntary Commitment). CSW contacted non emergency law enforcement for patient to be served. CSW placed paperwork on patient's chart.  ? ?Celso Sickle, LCSW ?Clinical Social Worker ?[facility] ?Cell#: [phone] ? ?

## 2021-12-29 NOTE — Consult Note (Signed)
[facility]-to[facility]Face [facility]iatric [facility] ? ? ?Service Date: December 29, 2021 ?LOS:  LOS: 1 day  ? ? ?Assessment  ?Megan Silva is a 26 y.o. female admitted medically for 12/28/2021 12:36 PM for PPROM and ultimately fetal demise. She carries the psychiatric diagnoses of passive SI earlier this pregnancy and has a past medical history of  DMI.Psychiatry was consulted for concern for postpartum psychosis by Dr. Debroah LoopArnold.  ? ? ?On exam, pt was not responsive to any questions. She did not nod or shake her head or squeeze finger when prompted. She had not spoken per nurse all AM and had not allowed any blood sugar assessment or for nurse to check insulin pump. Due to prior suicidal thoughts while being overwhelmed, inability to engage in any conversation or risk assessment, and pt's refusal of medical care with access to highly lethal means for overdose IVC paperwork was initiated to allow for necessary medical care - attempted to discuss this with pt with no success. Differential for underlying psychiatric illness at this point is broad and includes acute stress disorder (likely present regardless of other diagnoses), postpartum depression, and postpartum psychosis. Catatonia was on differential prior to exam based on collateral from nursing staff and SW however sx of withdrawal, mutism, etc appear to be intermittent with at least some volitional component- pt has been through significant trauma which people process differently. Will attempt full evaluation of pt tomorrow.  ? ? ?Diagnoses:  ?Active Hospital problems: ?Principal Problem: ?  VBAC (vaginal birth after Cesarean) ?Active Problems: ?  Type 1 diabetes mellitus (HCC) ?  Supervision of high risk pregnancy, antepartum ?  History of preterm premature rupture of membranes (PPROM) ?  History of C-section ?  Alpha thalassemia silent carrier ?  Premature rupture of membranes ?  ? ? ?Plan  ?## Safety and Observation Level:  ?- Based on my  clinical evaluation, I estimate the patient to be at moderate to high risk of self harm in the current setting ?- At this time, we recommend a 1:1 level of observation. This decision is based on my review of the chart including patient's history and current presentation, interview of the patient, mental status examination, and consideration of suicide risk including evaluating suicidal ideation, plan, intent, suicidal or self-harm behaviors, risk factors, and protective factors. This judgment is based on our ability to directly address suicide risk, implement suicide prevention strategies and develop a safety plan while the patient is in the clinical setting. Please contact our team if there is a concern that risk level has changed. ? ? ?## Medications:  ?-- OK to give ativan 2 mg for agitation ? ? ?## Medical Decision Making Capacity:  ?Not formally assessed. Functionally lacked at time of my assessment 2/2 inability or refusal to speak with team.  ? ?## Further Work-up:  ?-- None at present moment ? ?-- prior EKG from 2020 with Qtc in 400s ? ?# Legal status ?-- under IVC to allow for needed medical care; would allow pt as much contact with family as is possible (ie phone) ?-- would allow pt to remain in normal hospital attire/clothes so long as no strings are available.  ? ?## Disposition:  ?-- Unclear at this time. Likely inpt psych unless pt able to participate in extensive safety planning tomorrow.  ? ?Thank you for this consult request. Recommendations have been communicated to the primary team.  We will continue to follow at this time.  ? ?Claris CheMargaret A Eduar Kumpf ? ? ?  New history   ?Relevant Aspects of Hospital Course:  ?Admitted on 12/28/2021 for PROM at 22 weeks 0 days leading to delivery of a live infant who passed away shortly after birth. ? ?Patient Report:  ?Patient seen lying in bed. She does not respond to interview at any point although does occasionally grab tissues to cry uncontrollably. She did not  nod her head when I asked if her name was Megan Silva and did not squeeze finger on command. Had occasional full body jerks. I did discuss that I would be filling out paperwork to allow nurses to check blood sugar and for pt to have someone in the room with her to which there was no response. Attempted to normalize pt's response to trauma and provide validation/empathic listening with little success.   ? ?Later (after IVC filled out) briefly saw her texting an unknown individual, did show nursing staff phone screen with blood sugar number.  ? ?ROS:  ?Unable to assess - pt mute ? ?Collateral information:  ?From nursing  ? ?Psychiatric History:  ?Information collected from medical record ? ?Prior hx depression with SI with no plan, was in counseling both as a child and recently with Marybelle Killings ? ?Hx NSSIB (cutting) and no known hx suicide attempts ?Recently lost partner, has 1 prior pregnancy loss in addition to current pregnancy loss ?No hx inpt psychiatric tx ? ?Family psych history: Unknown ? ? ?Social History:  ?Unknown current domicile ?Was separated from partner in 12/22 ? ?Tobacco use: unknown ?Alcohol use: unknown ?Drug use: unknown  ? ?Family History:  ?The patient's family history includes Cancer in her paternal grandmother; Diabetes in her maternal grandmother; Healthy in her father and mother. ? ?Medical History: ?Past Medical History:  ?Diagnosis Date  ? Anxiety   ? Depression   ? DKA (diabetic ketoacidoses) 06/24/2019  ? DKA (diabetic ketoacidosis) (HCC)   ? Dysuria 01/26/2020  ? Elevated alkaline phosphatase level 12/30/2020  ? GBS bacteriuria 05/27/2018  ? Headache   ? Preterm delivery 12/27/2020  ? 11/2020: in the setting of untreated DKA. Scant or no prenatal care. 22-23wks based on neonate size. Unknown if pprom first or pprom during course of PTL.  Neonate passed. APGAR 0/0/0  ? Uncontrolled type 1 diabetes mellitus with hyperglycemia (HCC)   ? ? ?Surgical History: ?Past Surgical History:  ?Procedure  Laterality Date  ? CESAREAN SECTION N/A 07/06/2018  ? Procedure: CESAREAN SECTION;  Surgeon: Hermina Staggers, MD;  Location[facility] BIRTHING SUITES;  Service: Obstetrics;  Laterality: N/A;  ? WISDOM TOOTH EXTRACTION    ? ? ?Medications:  ? ?Current Facility-Administered Medications:  ?  acetaminophen (TYLENOL) tablet 650 mg, 650 mg, Oral, Q4H PRN, Worthy Rancher, MD ?  benzocaine-Menthol (DERMOPLAST) 20-0.5 % topical spray 1 application., 1 application., Topical, PRN, Worthy Rancher, MD ?  coconut oil, 1 application., Topical, PRN, Worthy Rancher, MD ?  witch hazel-glycerin (TUCKS) pad 1 application., 1 application., Topical, PRN **AND** dibucaine (NUPERCAINAL) 1 % rectal ointment 1 application., 1 application., Rectal, PRN, Worthy Rancher, MD ?  diphenhydrAMINE (BENADRYL) capsule 25 mg, 25 mg, Oral, Q6H PRN, Worthy Rancher, MD ?  ibuprofen (ADVIL) tablet 600 mg, 600 mg, Oral, Q6H, Worthy Rancher, MD, 600 mg at 12/28/21 2323 ?  insulin pump, , Subcutaneous, Q4H, Worthy Rancher, MD, 6 each at 12/28/21 2324 ?  LORazepam (ATIVAN) injection 2 mg, 2 mg, Intramuscular, Once, Lorimer Tiberio A ?  ondansetron (ZOFRAN) tablet 4 mg, 4 mg, Oral, Q4H  PRN **OR** ondansetron (ZOFRAN) injection 4 mg, 4 mg, Intravenous, Q4H PRN, Worthy Rancher, MD ?  oxyCODONE (Oxy IR/ROXICODONE) immediate release tablet 10 mg, 10 mg, Oral, Q4H PRN, Worthy Rancher, MD ?  oxyCODONE (Oxy IR/ROXICODONE) immediate release tablet 5 mg, 5 mg, Oral, Q4H PRN, Worthy Rancher, MD ?  prenatal multivitamin tablet 1 tablet, 1 tablet, Oral, Q1200, Worthy Rancher, MD ?  senna-docusate (Senokot-S) tablet 2 tablet, 2 tablet, Oral, Daily, Worthy Rancher, MD ?  simethicone [facility]) chewable tablet 80 mg, 80 mg, Oral, PRN, Worthy Rancher, MD ? ?Allergies: ?No Known Allergies ? ? ?  ?Objective  ?Vital signs:  ?Temp:  [97.8 ?F (36.6 ?C)-100.3 ?F (37.9 ?C)] 97.8 ?F (36.6 ?C) (04/13 0425) ?Pulse Rate:   [80-139] 80 (04/13 0425) ?Resp:  [15-18] 15 (04/13 0425) ?BP: (106-132)/(56-79) 121/59 (04/13 0425) ?SpO2:  [97 %-100 %] 97 % (04/13 0425) ? ?Psychiatric Specialty Exam: ? ?Presentation  ?General Appearance:

## 2021-12-29 NOTE — BH Specialist Note (Signed)
Pt did not arrive to video visit and did not answer the phone; Left HIPPA-compliant message to call back Eloyce Bultman from [facility] at  [phone] (Matteo Banke's office).  ?; left MyChart message for patient.  ? ?

## 2021-12-30 ENCOUNTER — Other Ambulatory Visit (HOSPITAL_COMMUNITY): Payer: Self-pay

## 2021-12-30 DIAGNOSIS — F430 Acute stress reaction: Secondary | ICD-10-CM

## 2021-12-30 LAB — SURGICAL PATHOLOGY

## 2021-12-30 LAB — GLUCOSE, CAPILLARY
Glucose-Capillary: 299 mg/dL — ABNORMAL HIGH (ref 70–99)
Glucose-Capillary: 178 mg/dL — ABNORMAL HIGH (ref 70–99)
Glucose-Capillary: 109 mg/dL — ABNORMAL HIGH (ref 70–99)

## 2021-12-30 MED ORDER — IBUPROFEN 600 MG PO TABS
600.0000 mg | ORAL_TABLET | Freq: Four times a day (QID) | ORAL | 0 refills | Status: AC
Start: 2021-12-30 — End: ?
  Filled 2021-12-30: qty 30, 8d supply, fill #0

## 2021-12-30 NOTE — Lactation Note (Signed)
Lactation Consultation Note ? ?Patient Name: Megan Silva ?Today's Date: 12/30/2021 ?Reason for consult: Follow-up assessment;Mother's request;Engorgement;Breastfeeding assistance ?Age:26 y.o. ? ?LC assisted removing milk causing discomfort with 21 flanges and hand pump. Mom started with pain of 8 and reduced to pain of 4. Breasts are hard and full, dependent areas under breasts are harder then top.  ? ?Once at home, Mom to use frozen peas for cold 20 mins on her back and follow up with cold cabbage leaves, crushing spines and replacing when wilted.  ?Mom to take motrin as directed.  ? ?Mom to wear support day and night. Mom to not over stimulate breast with pumping or hand expression.  ?All questions answered at the end of the visit.  ? ?Maternal Data ?  ? ?Feeding ?  ? ?LATCH Score ?  ? ?  ? ?  ? ?  ? ?  ? ?  ? ? ?Lactation Tools Discussed/Used ?Tools: Pump;Flanges ?Flange Size: 21 ?Breast pump type: Manual ?Reason for Pumping: relieve discomfort ?Pumping frequency: pump to relieve discomfort. Once at home, mom to use frozen peas, lay on her back 15-20 min, followed by using cold cabbage leaves crushing the spines and replace when wilted. ? ?Interventions ?  ? ?Discharge ?Discharge Education: Engorgement and breast care ? ?Consult Status ?Consult Status: Complete ? ? ? ?Ayano Douthitt  Nicholson-Springer ?12/30/2021, 1:02 PM ? ? ? ?

## 2021-12-30 NOTE — Discharge Summary (Signed)
? ?  Postpartum Discharge Summary ? ?Date of Service updated 12/30/2021 ? ?   ?Patient Name: Megan Silva ?DOB: October 20, 1995 ?MRN: [id number] ? ?Date of admission: 12/28/2021 ?Delivery date:12/28/2021  ?Delivering provider: Genia Del  ?Date of discharge: 12/30/2021 ? ?Admitting diagnosis: Premature rupture of membranes [O42.90] ?Intrauterine pregnancy: [redacted]w[redacted]d    ?Secondary diagnosis:  Principal Problem: ?  VBAC (vaginal birth after Cesarean) ?Active Problems: ?  Type 1 diabetes mellitus ([place] ?  Supervision of high risk pregnancy, antepartum ?  History of preterm premature rupture of membranes (PPROM) ?  History of C-section ?  Alpha thalassemia silent carrier ?  Premature rupture of membranes ?  Acute stress disorder ? ?Additional problems: risk of self harm and temporary IVC rescinded on 4/14    ?Discharge diagnosis:  premature pregnancy delivered 22 weeks                                               ?Post partum procedures: no ?Augmentation: N/A ?Complications: previable infant with demise ? ?Hospital course: Onset of Labor With Vaginal Delivery      ?26y.o. yo [id number] 220w0das admitted in Active Labor with gross ROM on 12/28/2021. Patient had a labor course as follows:  ?Membrane Rupture Time/Date: 11:20 AM ,12/28/2021   ?Delivery Method:VBAC, Spontaneous  ?Episiotomy: None  ?Lacerations:  None  ?She [facility] aspart temporarily after delivery as she was uncooperative and required a psychiatry consult and IVC for 24 h until her safety could be assessed. This was rescinded on 4/14.  She could then resume her insulin pump. She is ambulating, tolerating a regular diet, passing flatus, and urinating well. Patient is discharged home in stable condition on 12/30/21. ? ?Newborn Data: ?Birth date:12/28/2021  ?Birth time:3:41 PM  ?Gender:Female  ?Living status:Living  ?Apgars:1 ,1  ?Weight:471 g  ? ?Magnesium Sulfate received: No ?BMZ received: No ?Rhophylac:No ?MMR:No ?T-DaP: no ?Flu:  No ?Transfusion:No ? ?Physical exam  ?Vitals:  ? 12/29/21 2026 12/30/21 0101 12/30/21 0458 12/30/21 0802  ?BP: 130/75 (!) 107/49 112/62 124/76  ?Pulse: 89 72 77 75  ?Resp:  _0 ?Temp:  98 ?F (36.7 ?C) 97.9 ?F (36.6 ?C) 98.6 ?F (37 ?C)  ?TempSrc:  Oral Oral Oral  ?SpO2:  99% 100% 100%  ? ?General: alert, cooperative, and no distress ?Lochia: appropriate ?Uterine Fundus: firm ?Incision: N/A ?DVT Evaluation: No evidence of DVT seen on physical exam. ?Labs: ?Lab Results  ?Component Value Date  ? WBC 25.4 (H) 12/28/2021  ? HGB 11.1 (L) 12/28/2021  ? HCT 33.6 (L) 12/28/2021  ? MCV 80.2 12/28/2021  ? PLT 418 (H) 12/28/2021  ? ? ?  Latest Ref Rng & Units 12/28/2021  ? 12:59 PM  ?CMP  ?Glucose 70 - 99 mg/dL 165    ?BUN 6 - 20 mg/dL 6    ?Creatinine 0.44 - 1.00 mg/dL 0.58    ?Sodium 135 - 145 mmol/L 135    ?Potassium 3.5 - 5.1 mmol/L 3.5    ?Chloride 98 - 111 mmol/L 106    ?CO2 22 - 32 mmol/L 22    ?Calcium 8.9 - 10.3 mg/dL 9.0    ?Total Protein 6.5 - 8.1 g/dL 7.0    ?Total Bilirubin 0.3 - 1.2 mg/dL 0.5    ?Alkaline Phos 38 - 126 U/L 61    ?  AST 15 - 41 U/L 12    ?ALT 0 - 44 U/L 7    ? ?Edinburgh Score: ? ?  12/27/2020  ?  9:35 AM  ?Edinburgh Postnatal Depression Scale Screening Tool  ?I have been able to laugh and see the funny side of things. 0  ?I have looked forward with enjoyment to things. 0  ?I have blamed myself unnecessarily when things went wrong. 2  ?I have been anxious or worried for no good reason. 2  ?I have felt scared or panicky for no good reason. 0  ?Things have been getting on top of me. 0  ?I have been so unhappy that I have had difficulty sleeping. 2  ?I have felt sad or miserable. 0  ?I have been so unhappy that I have been crying. 2  ?The thought of harming myself has occurred to me. 0  ?Edinburgh Postnatal Depression Scale Total 8  ? ? ? ?After visit meds:  ?Allergies as of 12/30/2021   ?No Known Allergies ?  ? ?  ?Medication List  ?  ? ?TAKE these medications   ? ?Accu-Chek Guide w/Device Kit ?USE  AS DIRECTED TO CHECK BLOOD SUGAR AS DIRECTED ?  ?acetaminophen 500 MG tablet ?Commonly known as: TYLENOL ?Take 500 mg by mouth every 6 (six) hours as needed. ?  ?aspirin EC 81 MG tablet ?Take 1 tablet (81 mg total) by mouth daily. Swallow whole. ?  ?BD Pen Needle Nano 2nd Gen 32G X 4 MM Misc ?Generic drug: Insulin Pen Needle ?USE ONE PEN NEEDLE TO CHECK BLOOD SUGAR AS DIRECTED ?  ?Insulin Pen Needle 32G X 4 MM Misc ?Use as directed to inject insulin. ?  ?BLOOD GLUCOSE METER DISPOSABLE Devi ?Use one blood glucose strip to check blood sugar as directed ?  ?Blood Pressure Monitoring Devi ?1 each by Does not apply route once a week. ?  ?Dexcom G6 Transmitter Misc ?1 Device by Does not apply route every 3 (three) months. ?  ?Dexcom G7 Receiver [place] ?1 Device by Does not apply route as needed. ?  ?Dexcom G7 Sensor Misc ?Inject 1 Device into the skin as needed (change every 10 days). ?  ?glucose blood test strip ?USE AS DIRECTED TO CHECK BLOOD SUGARS FOUR TIMES DAILY ?  ?ibuprofen 600 MG tablet ?Commonly known as: ADVIL ?Take 1 tablet (600 mg total) by mouth every 6 (six) hours. ?  ?insulin aspart 100 UNIT/ML FlexPen ?Commonly known as: NOVOLOG ?Inject 4 Units into the skin 3 (three) times daily with meals. ?  ?insulin lispro 100 UNIT/ML injection ?Commonly known as: HumaLOG ?For continuous use in Omnipod. Currently on 52 units a day, plus priming,  to be increased per Provider. ?  ?Lancets Misc. Misc ?Use one lancet to check blood sugar as directed ?  ?Levemir FlexPen 100 UNIT/ML FlexPen ?Generic drug: insulin detemir ?Inject 15 Units into the skin 2 (two) times daily. ?  ?multivitamin-prenatal 27-0.8 MG Tabs tablet ?Take 1 tablet by mouth daily at 12 noon. ?  ?Omnipod 5 G6 Intro (Gen 5) Kit ?1 kit by Does not apply route every other day. ?  ?Omnipod 5 G6 Pod (Gen 5) Misc ?1 Device by Does not apply route every other day. ?  ?ReliOn Ultra Thin Lancets Misc ?by Does not apply route. ?  ?terconazole 0.4 % vaginal  cream ?Commonly known as: TERAZOL 7 ?Place 1 applicator vaginally at bedtime. ?  ? ?  ? ? ? ?Discharge home in stable condition ? ?Infant Disposition:morgue ?  Discharge instruction: per After Visit Summary and Postpartum booklet. ?Activity: Advance as tolerated. Pelvic rest for 6 weeks.  ?Diet: carb modified diet ?Future Appointments: ?Future Appointments  ?Date Time Provider [place]  ?01/11/2022  1:45 PM [facility]  ?02/07/2022  2:15 PM Genia Del, MD [facility]  ? ?Follow up Visit: ? Follow-up Information   ? ? [facility] Follow up in 2 week(s).   ?Specialty: Obstetrics and Gynecology ?Contact information: ?[address] ?[place] [address] ?[phone] ? ?  ?  ? ?  ?  ? ?  ? ? ? ?Please schedule this patient for a In person postpartum visit in  2 weeks  with the following provider: Any provider. ?Additional Postpartum F/U:Postpartum Depression checkup  ?High risk pregnancy complicated by:  tyle 1 dm and poor control and psychiatric history ?Delivery mode:  VBAC, Spontaneous  ?Anticipated Birth Control:  Unsure ? ? ?12/30/2021 ?Emeterio Reeve, MD ? ? ? ?

## 2021-12-30 NOTE — Consult Note (Addendum)
[facility] New Face-to-Face Psychiatric Evaluation ? ? ?Service Date: December 30, 2021 ?LOS:  LOS: 2 days  ? ? ?Assessment  ?Megan Silva is a 26 y.o. female admitted medically for 12/28/2021 12:36 PM for PPROM and ultimately fetal demise. She carries the psychiatric diagnoses of passive SI earlier this pregnancy and has a past medical history of  DMI.Psychiatry was consulted for concern for postpartum psychosis by Dr. Roselie Awkward.  ? ? ?On exam, pt was not responsive to any questions. She did not nod or shake her head or squeeze finger when prompted. She had not spoken per nurse all AM and had not allowed any blood sugar assessment or for nurse to check insulin pump. Due to prior suicidal thoughts while being overwhelmed, inability to engage in any conversation or risk assessment, and pt's refusal of medical care with access to highly lethal means for overdose IVC paperwork was initiated to allow for necessary medical care - attempted to discuss this with pt with no success. Differential for underlying psychiatric illness at this point is broad and includes acute stress disorder (likely present regardless of other diagnoses), postpartum depression, and postpartum psychosis. Catatonia was on differential prior to exam based on collateral from nursing staff and SW however sx of withdrawal, mutism, etc appear to be intermittent with at least some volitional component- pt has been through significant trauma which people process differently. Will attempt full evaluation of pt tomorrow.  ? ?4/14: Spoke to both patient and mother and explained reasons for involuntary plan and paperwork filled out yesterday.  Had extensive conversation with patient-denies any recent suicidal ideations and would be able to reach out to mom if this changed.  Unfortunately, has experience with this type of loss and last time took her several weeks to be able to reach out for counseling.  She is open to receiving referrals for  both outpatient counseling and in particular group therapy but does not feel like she will act on these for at least a couple of weeks.  We did discuss voluntary psychiatric hospitalization, which patient declined due to a desire to process her grief with her family.  Her mother (who has no concerns for patient self-harm although has very reasonable concerns about pt's overall mental health) is flying down from [place] in the near future to offer additional support. Inpatient psychiatric hospitalization is no longer the least restrictive environment which can meet her need for ongoing psychiatric care and as such she may be released form the hospital with outpatient resources in place. ?  ? ? ? ?Diagnoses:  ?Active Hospital problems: ?Principal Problem: ?  VBAC (vaginal birth after Cesarean) ?Active Problems: ?  Type 1 diabetes mellitus ([place]) ?  Supervision of high risk pregnancy, antepartum ?  History of preterm premature rupture of membranes (PPROM) ?  History of C-section ?  Alpha thalassemia silent carrier ?  Premature rupture of membranes ?  Acute stress disorder ?  ? ? ?Plan  ?## Safety and Observation Level:  ?- Based on my clinical evaluation, I estimate the patient to be at low risk of self harm in the current setting ?- At this time, we recommend a routine level of observation. This decision is based on my review of the chart including patient's history and current presentation, interview of the patient, mental status examination, and consideration of suicide risk including evaluating suicidal ideation, plan, intent, suicidal or self-harm behaviors, risk factors, and protective factors. This judgment is based on our ability  to directly address suicide risk, implement suicide prevention strategies and develop a safety plan while the patient is in the clinical setting. Please contact our team if there is a concern that risk level has changed. ? ? ?## Medications:  ?-- None ? ? ?## Medical Decision Making  Capacity:  ?Not formally assessed.  ? ?## Further Work-up:  ?-- None at present moment ? ?-- prior EKG from 2020 with Qtc in 400s ? ?# Legal status ?-- had placed under IVC to allow for needed medical care; now rescinded ? ? ?## Disposition:  ?-- Home with family support ? ?Thank you for this consult request. Recommendations have been communicated to the primary team.  We will continue to follow at this time.  ? ?Megan Silva ? ? ?New history   ?Relevant Aspects of Hospital Course:  ?Admitted on 12/28/2021 for PROM at 22 weeks 0 days leading to delivery of a live infant who passed away shortly after birth. ? ?Patient Report:  ?Patient seen in AM.  She is alert oriented and attentive (days of week backwards with no errors).  She is tearful but communicative; expresses anger that IVC paperwork was filled out yesterday.  Open and close conversation by apologizing for this and explaining reasoning behind paperwork being filled out. ? ?She reports that she is going through her loss and feels the need to grieve on her own.  She cries frequently through the assessment, particularly when discussing this loss.  She has not told her son what has happened but is looking forward to hopefully seeing him.  She reports that she is not currently ready to engage in outpatient counseling (did not connect well with her prior counselor) but will want this eventually.  In particular interested in group therapy with people who have been through similar losses. ? ?Patient reports no to minimal symptoms of depression leading into this loss-was sleeping well enjoy things much as always, had no feelings of guilt or worthlessness, and minor changes in appetite here for 3-week pregnancy.  Does not report significant anxiety prior to this.  Reports no psychosis or manic symptoms recently or historically.  Has been having some nightmares and flashbacks related to loss of child but does not feel any guilt or like it was her  fault. ? ?Denies any recent SI (had passive SI earlier this pregnancy).  Additionally denies ever intentionally skipping or taking extra insulin to self-harm. ? ?Reports she will be staying with her partner after discharge and that they have mended rupturing relationship referenced in prior psych notes.  Feels he has been supportive through this process.  Would reach out to her mom first with any worsening of mood or suicidal ideations, and would present to emergency resources if this occurred.  ? ? ? ?ROS:  ?Unable to assess - pt mute ? ?Collateral information:  ?Spoke to mother with patient's permission.  She additionally reports anger about involuntary commitment; dispelled some myths about this (ie will not show up on a background check unless 2nd opinion filled out) and explained rationale for involuntary commitment (emphasizing easy access to insulin and refusal of blood sugar checks).  She reports no immediate concerns for daughter reports that throughout her life, she has always dealt with grief by withdrawing and refusing to talk for a day or so before opening up.  She has significant long-term concerns for her daughter's mental health, especially as this is her second late pregnancy loss but does not report any immediate concerns for  self-harm or suicidality.  She shares that she lost her first pregnancy at 27 weeks and she felt like before this she could come for daughter, but has had difficulty imagining how she can step into the same role after the second loss.  Does ask to make sure that resources for outpatient group counseling will be made available to her daughter. ? ?Psychiatric History:  ?Information collected from medical record ? ?Prior hx depression with SI with no plan, was in counseling both as a child and recently with Gwyneth Revels (reported poor connection) ? ?Hx NSSIB (cutting) and no known hx suicide attempts ?Recently lost partner, has 1 prior pregnancy loss in addition to current  pregnancy loss ?No hx inpt psychiatric tx ?No hx psychiatric med trials ? ?Family psych history: Unknown ? ? ?Social History:  ?Will be staying with partner after discharge ?Does not identify as spiritual/religious ?No ac

## 2021-12-30 NOTE — Progress Notes (Signed)
CSW faxed rescinding IVC document to [facility].  CSW spoke with office to confirm document was received. CSW placed document in patient's folder along with faxed confirmation.  ? ?CSW met with patient in room 111.  When CSW arrived, patient was resting in bed.  Patient was more engaged with CSW today then yesterday.  Although patient was not that verbal she did utilize non verbal communication to communicate with CSW. Patient was not receptive to outpatient counseling resources, however she was receptive to support groups for fetal demises. Patient was appropriately tearful and denied SI and HI when assessed for safety.  Patient shared having the support of her family post discharge.  CSW provided patient with CSW contact information and encouraged patient to call CSW if a needed arise; patient agreed.  ? ?CSW updated RN.  ? ?There are no barriers to patient's discharge.  ? ?Laurey Arrow, MSW, LCSW ?Clinical Social Work ?[phone] ?  ?

## 2021-12-30 NOTE — Progress Notes (Signed)
Discharge instructions given to patient, all questions and concerns addressed, and patient verbalized understanding. Significant other at bedside during discharge. Resources provided by social work and RN encouraged patient to reach out for any need. Follow-up appointment, medications, and when to call MD discussed.   ?

## 2021-12-30 NOTE — Lactation Note (Signed)
Lactation Consultation Note ? ?Patient Name: Megan Silva ?Today's Date: 12/30/2021 ?  ?Age:26 y.o. ? ?Mom recently seen by Child psychotherapist and needs some time as stated by RN, [employer]. LC to follow up later when Mom is able.  ? ?Maternal Data ?  ? ?Feeding ?  ? ?LATCH Score ?  ? ?  ? ?  ? ?  ? ?  ? ?  ? ? ?Lactation Tools Discussed/Used ?  ? ?Interventions ?  ? ?Discharge ?  ? ?Consult Status ?  ? ? ? ?Kimya Mccahill  Nicholson-Springer ?12/30/2021, 11:27 AM ? ? ? ?

## 2021-12-30 NOTE — Progress Notes (Addendum)
This chaplain responded to spiritual care consult for fetal demise after Pt. premature rupture of membranes. This chaplain reviewed the chart notes and was updated by the Pt. RN-Crystal before the visit.  ? ?The chaplain understands the psychiatrist is present with the Pt. at the time of the visit. The chaplain will attempt a visit later in the day. ? ?**1137 This chaplain returned to the Pt. bedside when updated by the Pt. RN-Crystal, discharge is planned for today. The Pt. accepted the chaplain's invitation to sit beside the Pt. The Pt. is pumping at the time of the visit. The Pt. answered all communication with a nod and/or quiet yes or no.  ? ?The Pt. identified the visitor-Artis, who joined the chaplain visit after it started, as her support person. Artis is able to share the infants name and discussion about a funeral home. The chaplain understands resources for grief care were accepted by the Pt. and Artis in the space of the individuality of grief.  Discharge education was provided by the RN along with space to consider holding the infant with or without the chaplain.   ? ?This chaplain is available for F/U spiritual care as needed. ? ? ?Chaplain Sallyanne Kuster ?[phone] ?

## 2021-12-30 NOTE — Progress Notes (Signed)
Post Partum Day 2 ?Subjective: ?Breast engorgement ? ?Objective: ?Blood pressure 124/76, pulse 75, temperature 98.6 ?F (37 ?C), temperature source Oral, resp. rate 16, last menstrual period 07/27/2021, SpO2 100 %, unknown if currently breastfeeding. ? ?Physical Exam:  ?General: alert, cooperative, and no distress ?Lochia: appropriate ?Uterine Fundus: firm ?DVT Evaluation: No evidence of DVT seen on physical exam. ? ?Recent Labs  ?  12/28/21 ?1259  ?HGB 11.1*  ?HCT 33.6*  ? ? ?Assessment/Plan: ?Pumping for comfort, lactation has been consulted. IVC, awaiting plan from Behavioral health  ? ? LOS: 2 days  ? ?Megan Silva ?12/30/2021, 9:12 AM  ? ? ?

## 2021-12-30 NOTE — Progress Notes (Signed)
Inpatient Diabetes Program Recommendations ? ?AACE/ADA: New Consensus Statement on Inpatient Glycemic Control  ? ?Target Ranges:  Prepandial:   less than 140 mg/dL ?     Peak postprandial:   less than 180 mg/dL (1-2 hours) ?     Critically ill patients:  140 - 180 mg/dL  ? ? Latest Reference Range & Units 12/30/21 00:59 12/30/21 04:56 12/30/21 09:01  ?Glucose-Capillary 70 - 99 mg/dL 093 (H) 818 (H) 299 (H)  ? ?Review of Glycemic Control ? ?Diabetes history: DM1 (makes NO insulin; requires basal, correction, and carb coverage insulin) ?Outpatient Diabetes medications: Insulin Pump with Dexcom CGM ?Current orders for Inpatient glycemic control: Semglee 20 units Q24H, Novolog 0-9 units Q4H, Novolog 4 units TID with meals ? ?NOTE: Spoke with Crystal, RN regarding patient care plan. RN reports that patient has been seen again by psych today and has been cleared of IVC and patient will be discharging home. Spoke with patient at bedside. Patient did not speak verbally very much but nodded to indicate yes or no when asked questions. Patient indicates she plans to get restarted on her insulin pump when she gets home today. Reminded patient that she may need insulin pump adjustments since she has delivered as insulin needs have likely changed. Patient states she was working with RD at the [facility] regarding insulin pump management. Reminded patient that she received Semglee 20 units at 13:49 on 12/29/21 and it would work for about 24 hours. She should be fine to resume her insulin pump once she gets home today. Encouraged patient to reach out to RD at [facility] if she has any issues with hypoglycemia at all. Patient states she knows how to make changes with insulin pump settings if needed. Patient states she has everything she needs at home for DM control. Encouraged patient to follow up with providers regarding DM management and also to talk with providers about how she is feeling and grieving. Patient nodded head to  indicate she understands information discussed and that she has no questions at this time. Spoke with [employer], RN again regarding conversation with patient. In reviewing the chart, noted patient message by Heywood Bene, RD on 11/25/21 regarding appointment for 11/30/21 for OmniPod training. Sent secure chat to Heywood Bene, RD at [facility] for [facility] regarding following up with patient for insulin pump management post delivery.  ? ?Thanks, ?Orlando Penner, RN, MSN, CDE ?Diabetes Coordinator ?Inpatient Diabetes Program ?[phone] (Team Pager from 8am to 5pm) ? ?

## 2021-12-30 NOTE — Plan of Care (Signed)

## 2022-01-01 LAB — BPAM RBC
Blood Product Expiration Date: 202305032359
Blood Product Expiration Date: 202305032359
Unit Type and Rh: 7300
Unit Type and Rh: 7300

## 2022-01-01 LAB — TYPE AND SCREEN
ABO/RH(D): B POS
Antibody Screen: POSITIVE
Unit division: 0
Unit division: 0

## 2022-01-03 ENCOUNTER — Ambulatory Visit: Payer: Medicaid Other

## 2022-01-03 ENCOUNTER — Encounter: Payer: Self-pay | Admitting: *Deleted

## 2022-01-09 ENCOUNTER — Other Ambulatory Visit (HOSPITAL_COMMUNITY): Payer: Self-pay

## 2022-01-09 ENCOUNTER — Telehealth (HOSPITAL_COMMUNITY): Payer: Self-pay | Admitting: *Deleted

## 2022-01-09 NOTE — Telephone Encounter (Signed)
Left phone voicemail message.  Deserai Cansler, RN 

## 2022-01-11 ENCOUNTER — Ambulatory Visit: Payer: Medicaid Other | Admitting: Clinical

## 2022-01-11 DIAGNOSIS — Z91199 Patient's noncompliance with other medical treatment and regimen due to unspecified reason: Secondary | ICD-10-CM

## 2022-02-07 ENCOUNTER — Ambulatory Visit: Payer: Self-pay | Admitting: Family Medicine

## 2022-02-07 NOTE — Progress Notes (Deleted)
    Post Partum Visit Note  Megan Silva is a 26 y.o. [id number] female who presents for a postpartum visit. She is  5.6  weeks postpartum following a VBAC.  I have fully reviewed the prenatal and intrapartum course. The delivery was at 22 gestational weeks.  Anesthesia: none. Postpartum course has been ***. Baby is doing well***. Baby is feeding by {breastmilk/bottle:69}. Bleeding {vag bleed:12292}. Bowel function is {normal:32111}. Bladder function is {normal:32111}. Patient {is/is not:9024} sexually active. Contraception method is none. Postpartum depression screening: {gen negative/positive:315881}.   The pregnancy intention screening data noted above was reviewed. Potential methods of contraception were discussed. The patient elected to proceed with No data recorded.    Health Maintenance Due  Topic Date Due   COVID-19 Vaccine (1) Never done   OPHTHALMOLOGY EXAM  Never done   HPV VACCINES (1 - 2-dose series) Never done    {Common ambulatory SmartLinks:19316}  Review of Systems {ros; complete:30496}  Objective:  LMP 07/27/2021 (Exact Date)    General:  {gen appearance:16600}   Breasts:  {desc; normal/abnormal/not indicated:14647}  Lungs: {lung exam:16931}  Heart:  {heart exam:5510}  Abdomen: {abdomen exam:16834}   Wound {Wound assessment:11097}  GU exam:  {desc; normal/abnormal/not indicated:14647}       Assessment:    1. Postpartum care and examination ***   *** postpartum exam.   Plan:   Essential components of care per ACOG recommendations:  1.  Mood and well being: Patient with {gen negative/positive:315881} depression screening today. Reviewed local resources for support.  - Patient tobacco use? {tobacco use:25506}  - hx of drug use? {yes/no:25505}    2. Infant care and feeding:  -Patient currently breastmilk feeding? {yes/no:25502}  -Social determinants of health (SDOH) reviewed in EPIC. No concerns***The following needs were identified***  3.  Sexuality, contraception and birth spacing - Patient {DOES_DOES HTD:42876} want a pregnancy in the next year.  Desired family size is {NUMBER 1-10:22536} children.  - Reviewed reproductive life planning. Reviewed contraceptive methods based on pt preferences and effectiveness.  Patient desired {Upstream End Methods:24109} today.   - Discussed birth spacing of 18 months  4. Sleep and fatigue -Encouraged family/partner/community support of 4 hrs of uninterrupted sleep to help with mood and fatigue  5. Physical Recovery  - Discussed patients delivery and complications. She describes her labor as {description:25511} - Patient had a {CHL AMB DELIVERY:[phone]}. Patient had a {laceration:25518} laceration. Perineal healing reviewed. Patient expressed understanding - Patient has urinary incontinence? {yes/no:25515} - Patient {ACTION; IS/IS OTL:[id number]} safe to resume physical and sexual activity  6.  Health Maintenance - HM due items addressed {Yes or If no, why not?:20788} - Last pap smear  Diagnosis  Date Value Ref Range Status  10/24/2021   Final   - Negative for Intraepithelial Lesions or Malignancy (NILM)  10/24/2021 - Benign reactive/reparative changes  Final   Pap smear {done:10129} at today's visit.  -Breast Cancer screening indicated? {indicated:25516}  7. Chronic Disease/Pregnancy Condition follow up: {Follow up:25499}  - PCP follow up  Guy Begin, CMA [facility] [employer], [facility]

## 2022-03-11 ENCOUNTER — Encounter: Payer: Self-pay | Admitting: *Deleted

## 2022-03-27 ENCOUNTER — Emergency Department (HOSPITAL_COMMUNITY): Payer: Medicaid Other

## 2022-03-27 ENCOUNTER — Encounter (HOSPITAL_COMMUNITY): Payer: Self-pay | Admitting: *Deleted

## 2022-03-27 ENCOUNTER — Emergency Department (HOSPITAL_COMMUNITY)
Admission: EM | Admit: 2022-03-27 | Discharge: 2022-03-27 | Disposition: A | Discharge status: Home / Self Care | Payer: Medicaid Other | Attending: Emergency Medicine | Admitting: Emergency Medicine

## 2022-03-27 ENCOUNTER — Other Ambulatory Visit: Payer: Self-pay

## 2022-03-27 DIAGNOSIS — N200 Calculus of kidney: Secondary | ICD-10-CM | POA: Diagnosis not present

## 2022-03-27 DIAGNOSIS — R109 Unspecified abdominal pain: Secondary | ICD-10-CM | POA: Diagnosis present

## 2022-03-27 DIAGNOSIS — N12 Tubulo-interstitial nephritis, not specified as acute or chronic: Secondary | ICD-10-CM

## 2022-03-27 DIAGNOSIS — E1065 Type 1 diabetes mellitus with hyperglycemia: Secondary | ICD-10-CM | POA: Diagnosis not present

## 2022-03-27 DIAGNOSIS — Z794 Long term (current) use of insulin: Secondary | ICD-10-CM | POA: Diagnosis not present

## 2022-03-27 DIAGNOSIS — D72829 Elevated white blood cell count, unspecified: Secondary | ICD-10-CM | POA: Diagnosis not present

## 2022-03-27 LAB — COMPREHENSIVE METABOLIC PANEL
ALT: 11 U/L (ref 0–44)
AST: 11 U/L — ABNORMAL LOW (ref 15–41)
Albumin: 4 g/dL (ref 3.5–5.0)
Alkaline Phosphatase: 78 U/L (ref 38–126)
Anion gap: 10 (ref 5–15)
BUN: 6 mg/dL (ref 6–20)
CO2: 26 mmol/L (ref 22–32)
Calcium: 9.4 mg/dL (ref 8.9–10.3)
Chloride: 103 mmol/L (ref 98–111)
Creatinine, Ser: 0.6 mg/dL (ref 0.44–1.00)
GFR, Estimated: >60 mL/min (ref >60)
Glucose, Bld: 293 mg/dL — ABNORMAL HIGH (ref 70–99)
Potassium: 3.4 mmol/L — ABNORMAL LOW (ref 3.5–5.1)
Sodium: 139 mmol/L (ref 135–145)
Total Bilirubin: 1.1 mg/dL (ref 0.3–1.2)
Total Protein: 8.2 g/dL — ABNORMAL HIGH (ref 6.5–8.1)

## 2022-03-27 LAB — CBC WITH DIFFERENTIAL/PLATELET
Abs Immature Granulocytes: 0.09 10*3/uL — ABNORMAL HIGH (ref 0.00–0.07)
Basophils Absolute: 0.1 10*3/uL (ref 0.0–0.1)
Basophils Relative: 0 %
Eosinophils Absolute: 0 10*3/uL (ref 0.0–0.5)
Eosinophils Relative: 0 %
HCT: 40.9 % (ref 36.0–46.0)
Hemoglobin: 12.6 g/dL (ref 12.0–15.0)
Immature Granulocytes: 1 %
Lymphocytes Relative: 9 %
Lymphs Abs: 1.4 10*3/uL (ref 0.7–4.0)
MCH: 24.8 pg — ABNORMAL LOW (ref 26.0–34.0)
MCHC: 30.8 g/dL (ref 30.0–36.0)
MCV: 80.5 fL (ref 80.0–100.0)
Monocytes Absolute: 1.3 10*3/uL — ABNORMAL HIGH (ref 0.1–1.0)
Monocytes Relative: 9 %
Neutro Abs: 11.8 10*3/uL — ABNORMAL HIGH (ref 1.7–7.7)
Neutrophils Relative %: 81 %
Platelets: 308 10*3/uL (ref 150–400)
RBC: 5.08 MIL/uL (ref 3.87–5.11)
RDW: 15.2 % (ref 11.5–15.5)
WBC: 14.7 10*3/uL — ABNORMAL HIGH (ref 4.0–10.5)
nRBC: 0 % (ref 0.0–0.2)

## 2022-03-27 LAB — URINALYSIS, ROUTINE W REFLEX MICROSCOPIC
Bilirubin Urine: NEGATIVE
Glucose, UA: >=500 mg/dL — AB
Ketones, ur: 20 mg/dL — AB
Nitrite: POSITIVE — AB
Protein, ur: 30 mg/dL — AB
Specific Gravity, Urine: 1.02 (ref 1.005–1.030)
WBC, UA: >50 WBC/hpf — ABNORMAL HIGH (ref 0–5)
pH: 6 (ref 5.0–8.0)

## 2022-03-27 LAB — PREGNANCY, URINE: Preg Test, Ur: NEGATIVE

## 2022-03-27 LAB — LIPASE, BLOOD: Lipase: 19 U/L (ref 11–51)

## 2022-03-27 LAB — CBG MONITORING, ED
Glucose-Capillary: 254 mg/dL — ABNORMAL HIGH (ref 70–99)
Glucose-Capillary: 386 mg/dL — ABNORMAL HIGH (ref 70–99)

## 2022-03-27 MED ORDER — ONDANSETRON HCL 4 MG/2ML IJ SOLN
4.0000 mg | Freq: Once | INTRAMUSCULAR | Status: AC
  Administered 2022-03-27: 4 mg via INTRAVENOUS
  Filled 2022-03-27: qty 2

## 2022-03-27 MED ORDER — METOCLOPRAMIDE HCL 5 MG/ML IJ SOLN
10.0000 mg | Freq: Once | INTRAMUSCULAR | Status: AC
Start: 2022-03-27 — End: 2022-03-27
  Administered 2022-03-27: 10 mg via INTRAVENOUS
  Filled 2022-03-27: qty 2

## 2022-03-27 MED ORDER — SODIUM CHLORIDE 0.9 % IV SOLN
1.0000 g | Freq: Once | INTRAVENOUS | Status: AC
  Administered 2022-03-27: 1 g via INTRAVENOUS
  Filled 2022-03-27: qty 10

## 2022-03-27 MED ORDER — SODIUM CHLORIDE 0.9 % IV BOLUS
1000.0000 mL | Freq: Once | INTRAVENOUS | Status: AC
Start: 2022-03-27 — End: 2022-03-27
  Administered 2022-03-27: 1000 mL via INTRAVENOUS

## 2022-03-27 MED ORDER — CIPROFLOXACIN HCL 500 MG PO TABS: 500.0000 mg | ORAL_TABLET | Freq: Two times a day (BID) | ORAL | 0 refills | Status: AC

## 2022-03-27 MED ORDER — ONDANSETRON HCL 4 MG PO TABS: 4.0000 mg | ORAL_TABLET | Freq: Four times a day (QID) | ORAL | 0 refills | Status: AC

## 2022-03-27 MED ORDER — KETOROLAC TROMETHAMINE 30 MG/ML IJ SOLN
30.0000 mg | Freq: Once | INTRAMUSCULAR | Status: AC
  Administered 2022-03-27: 30 mg via INTRAVENOUS
  Filled 2022-03-27: qty 1

## 2022-03-27 MED ORDER — SODIUM CHLORIDE 0.9 % IV BOLUS
1000.0000 mL | Freq: Once | INTRAVENOUS | Status: AC
  Administered 2022-03-27: 1000 mL via INTRAVENOUS

## 2022-03-27 NOTE — ED Triage Notes (Signed)
Pt states she is a type one diabetic and has not been able to eat or drink for the last couple of days; pt states the pain is on her right side and radiates to her back

## 2022-03-27 NOTE — ED Provider Notes (Signed)
[facility] EMERGENCY DEPARTMENT Provider Note   CSN: [id number] Arrival date & time: 03/27/22  1301     History {Add pertinent medical, surgical, social history, OB history to HPI:1} Chief Complaint  Patient presents with   Abdominal Pain    Megan Silva is a 26 y.o. female.   Abdominal Pain      Home Medications Prior to Admission medications   Medication Sig Start Date End Date Taking? Authorizing Provider  acetaminophen (TYLENOL) 500 MG tablet Take 500 mg by mouth every 6 (six) hours as needed.   Yes [provider]  ibuprofen (ADVIL) 600 MG tablet Take 1 tablet (600 mg total) by mouth every 6 (six) hours. 12/30/21  Yes Woodroe Mode, MD  Insulin Disposable Pump (OMNIPOD 5 G6 INTRO, GEN 5,) KIT 1 kit by Does not apply route every other day. 11/10/21  Yes Aletha Halim, MD  Insulin Disposable Pump (OMNIPOD 5 G6 POD, GEN 5,) MISC 1 Device by Does not apply route every other day. 11/10/21  Yes Aletha Halim, MD  insulin lispro (HUMALOG) 100 UNIT/ML injection For continuous use in Omnipod. Currently on 52 units a day, plus priming,  to be increased per Provider. 11/23/21  Yes Clarnce Flock, MD  BD PEN NEEDLE NANO 2ND GEN 32G X 4 MM MISC USE ONE PEN NEEDLE TO CHECK BLOOD SUGAR AS DIRECTED 08/31/21   Gavin Pound, CNM  Blood Gluc Meter Disp-Strips (BLOOD GLUCOSE METER DISPOSABLE) DEVI Use one blood glucose strip to check blood sugar as directed 08/27/21   Gavin Pound, CNM  Blood Glucose Monitoring Suppl (ACCU-CHEK GUIDE) w/Device KIT USE AS DIRECTED TO CHECK BLOOD SUGAR AS DIRECTED 08/27/21 08/18/22  Gavin Pound, CNM  Blood Pressure Monitoring DEVI 1 each by Does not apply route once a week. 10/13/21   Patriciaann Clan, DO  Continuous Blood Gluc Receiver (DEXCOM G7 RECEIVER) DEVI 1 Device by Does not apply route as needed. 11/10/21   Aletha Halim, MD  Continuous Blood Gluc Sensor (DEXCOM G7 SENSOR) MISC Inject 1 Device into the skin as needed (change every  10 days). 11/10/21   Aletha Halim, MD  Continuous Blood Gluc Transmit (DEXCOM G6 TRANSMITTER) MISC 1 Device by Does not apply route every 3 (three) months. 11/23/21   Clarnce Flock, MD  glucose blood test strip USE AS DIRECTED TO CHECK BLOOD SUGARS FOUR TIMES DAILY 08/27/21 08/27/22  Gavin Pound, CNM  Insulin Pen Needle 32G X 4 MM MISC Use as directed to inject insulin. 10/24/21 10/24/22  Patriciaann Clan, DO  Lancets Misc. MISC Use one lancet to check blood sugar as directed 08/27/21   Gavin Pound, CNM  ReliOn Ultra Thin Lancets MISC by Does not apply route. 07/22/14   [provider]      Allergies    Patient has no known allergies.    Review of Systems   Review of Systems  Gastrointestinal:  Positive for abdominal pain.    Physical Exam Updated Vital Signs BP 121/84   Pulse (!) 114   Temp 98.7 F (37.1 C) (Oral)   Resp 18   Ht 5' 2"  (1.575 m)   Wt 63.5 kg   LMP 03/06/2022 (Approximate)   SpO2 97%   BMI 25.61 kg/m  Physical Exam  ED Results / Procedures / Treatments   Labs (all labs ordered are listed, but only abnormal results are displayed) Labs Reviewed  CBC WITH DIFFERENTIAL/PLATELET - Abnormal; Notable for the following components:  Result Value   WBC 14.7 (*)    MCH 24.8 (*)    Neutro Abs 11.8 (*)    Monocytes Absolute 1.3 (*)    Abs Immature Granulocytes 0.09 (*)    All other components within normal limits  URINALYSIS, ROUTINE W REFLEX MICROSCOPIC - Abnormal; Notable for the following components:   APPearance HAZY (*)    Glucose, UA >=500 (*)    Hgb urine dipstick MODERATE (*)    Ketones, ur 20 (*)    Protein, ur 30 (*)    Nitrite POSITIVE (*)    Leukocytes,Ua MODERATE (*)    WBC, UA >50 (*)    Bacteria, UA RARE (*)    All other components within normal limits  COMPREHENSIVE METABOLIC PANEL - Abnormal; Notable for the following components:   Potassium 3.4 (*)    Glucose, Bld 293 (*)    Total Protein 8.2 (*)    AST 11 (*)    All  other components within normal limits  CBG MONITORING, ED - Abnormal; Notable for the following components:   Glucose-Capillary 254 (*)    All other components within normal limits  CBG MONITORING, ED - Abnormal; Notable for the following components:   Glucose-Capillary 386 (*)    All other components within normal limits  URINE CULTURE  PREGNANCY, URINE  LIPASE, BLOOD    EKG None  Radiology CT Renal Stone Study  Result Date: 03/27/2022 CLINICAL DATA:  Right flank pain. EXAM: CT ABDOMEN AND PELVIS WITHOUT CONTRAST TECHNIQUE: Multidetector CT imaging of the abdomen and pelvis was performed following the standard protocol without IV contrast. RADIATION DOSE REDUCTION: This exam was performed according to the departmental dose-optimization program which includes automated exposure control, adjustment of the mA and/or kV according to patient size and/or use of iterative reconstruction technique. COMPARISON:  None Available. FINDINGS: Lower chest: No acute abnormality. Hepatobiliary: No focal liver abnormality is seen. No gallstones, gallbladder wall thickening, or biliary dilatation. Pancreas: Unremarkable. No pancreatic ductal dilatation or surrounding inflammatory changes. Spleen: Normal in size without focal abnormality. Adrenals/Urinary Tract: There is mild fat stranding surrounding the inferior pole the right kidney in right renal pelvis. No hydronephrosis or urinary tract calculi are seen. There is diffuse bladder wall thickening. Left kidney and adrenal glands are within normal limits. Stomach/Bowel: Stomach is within normal limits. Appendix appears normal. No evidence of bowel wall thickening, distention, or inflammatory changes. Vascular/Lymphatic: No significant vascular findings are present. No enlarged abdominal or pelvic lymph nodes. Reproductive: Uterus and bilateral adnexa are unremarkable. Other: Trace free fluid in the pelvis.  No abdominal wall hernia. Musculoskeletal: Choose lung  IMPRESSION: 1. Right perinephric and periureteral stranding. No urinary tract calculi. No hydronephrosis. Findings are concerning for infection. Please correlate clinically. 2. Diffuse bladder wall thickening concerning for cystitis. 3. Trace free fluid in the pelvis. Electronically Signed   By: Ronney Asters M.D.   On: 03/27/2022 18:09    Procedures Procedures  {Document cardiac monitor, telemetry assessment procedure when appropriate:1}  Medications Ordered in ED Medications  sodium chloride 0.9 % bolus 1,000 mL (0 mLs Intravenous Stopped 03/27/22 1533)  ondansetron (ZOFRAN) injection 4 mg (4 mg Intravenous Given 03/27/22 1355)  sodium chloride 0.9 % bolus 1,000 mL (0 mLs Intravenous Stopped 03/27/22 1838)  metoCLOPramide (REGLAN) injection 10 mg (10 mg Intravenous Given 03/27/22 1719)  ketorolac (TORADOL) 30 MG/ML injection 30 mg (30 mg Intravenous Given 03/27/22 1719)  cefTRIAXone (ROCEPHIN) 1 g in sodium chloride 0.9 % 100 mL IVPB (0  g Intravenous Stopped 03/27/22 1907)    ED Course/ Medical Decision Making/ A&P                           Medical Decision Making Amount and/or Complexity of Data Reviewed Labs: ordered. Radiology: ordered.  Risk Prescription drug management.   Pt offered hospital admission, but she declined.  Given IVF and Rocephin here, urine culture pending.  No clinical evidence of DKA.  Nontoxic-appearing.  Has Omnipod insulin delivery system and Dexcom glucose monitoring.  She is agreeable to prompt ER return if not imroving  {Document critical care time when appropriate:1} {Document review of labs and clinical decision tools ie heart score, Chads2Vasc2 etc:1}  {Document your independent review of radiology images, and any outside records:1} {Document your discussion with family members, caretakers, and with consultants:1} {Document social determinants of health affecting pt's care:1} {Document your decision making why or why not admission, treatments were  needed:1} Final Clinical Impression(s) / ED Diagnoses Final diagnoses:  None    Rx / DC Orders ED Discharge Orders     None

## 2022-03-27 NOTE — ED Triage Notes (Signed)
Pt brought in by [facility] for c/o right flank pain x 2-3 days ago

## 2022-03-27 NOTE — Discharge Instructions (Signed)
You have a urinary tract infection that has traveled to your kidney.  It is very important that you take the antibiotic as directed until it is finished.  Drink plenty of water.  Closely monitor your blood sugars, return to the emergency department if you develop any worsening symptoms such as increasing pain, persistent vomiting, fever or persistent elevated blood sugar despite your insulin use

## 2022-03-27 NOTE — ED Notes (Signed)
EDP at bedside  

## 2022-03-28 LAB — CBG MONITORING, ED: Glucose-Capillary: 378 mg/dL — ABNORMAL HIGH (ref 70–99)

## 2022-03-31 LAB — URINE CULTURE: Culture: >=100000 — AB

## 2022-04-01 ENCOUNTER — Telehealth: Payer: Self-pay | Admitting: *Deleted

## 2022-04-01 NOTE — Telephone Encounter (Signed)
Post ED Visit - Positive Culture Follow-up  Culture report reviewed by antimicrobial stewardship pharmacist: [facility] []  , Pharm.D. []  Enzo Bi, Pharm.D., BCPS AQ-ID []  , Pharm.D., BCPS []  Celedonio Miyamoto, Pharm.D., BCPS []  [place], Garvin Fila.D., BCPS, AAHIVP []  , Pharm.D., BCPS, AAHIVP []  Georgina Pillion, PharmD, BCPS []  , PharmD, BCPS []  [place], PharmD, BCPS []  [address], PharmD []  , PharmD, BCPS []  Estella Husk, PharmD  Pharmacy Team []  Lysle Pearl, PharmD []  , PharmD []  Phillips Climes, PharmD []  , Rph []  Agapito Games) , PharmD []  Verlan Friends, PharmD []  , PharmD []  Mervyn Gay, PharmD []  , PharmD []  Vinnie Level, PharmD []  [facility], PharmD []  , PharmD []  Len Childs, PharmD   Positive urine culture Treated with Ciprofloxacin, organism sensitive to the same and no further patient follow-up is required at this time. , PharmD  Greer Pickerel Talley 04/01/2022, 1:26 PM

## 2022-08-28 ENCOUNTER — Encounter: Payer: Self-pay | Admitting: Internal Medicine

## 2022-10-04 ENCOUNTER — Encounter: Payer: Self-pay | Admitting: Lactation Services

## 2022-11-28 ENCOUNTER — Telehealth: Payer: Self-pay | Admitting: Dietician

## 2022-11-28 NOTE — Telephone Encounter (Signed)
Calling to see if patient would like to continue care at our office. Left voicemail for return call  

## 2023-05-20 IMAGING — US US OB < 14 WEEKS - US OB TV
1 series · 15 of 28 positions shown · non-contrast
Comparison: None.

CLINICAL DATA: Abdominal pain. LMP: 07/27/2021 corresponding to an
estimated gestational age of 4 weeks, 3 days.

EXAM:
OBSTETRIC <14 WK US AND TRANSVAGINAL OB US
TECHNIQUE: Both transabdominal and transvaginal ultrasound examinations were
performed for complete evaluation of the gestation as well as the
maternal uterus, adnexal regions, and pelvic cul-de-sac.
Transvaginal technique was performed to assess early pregnancy.

[Series 1: us ob < 14 weeks - us ob tv · 15 of 48 slices shown]
[im 1/48]
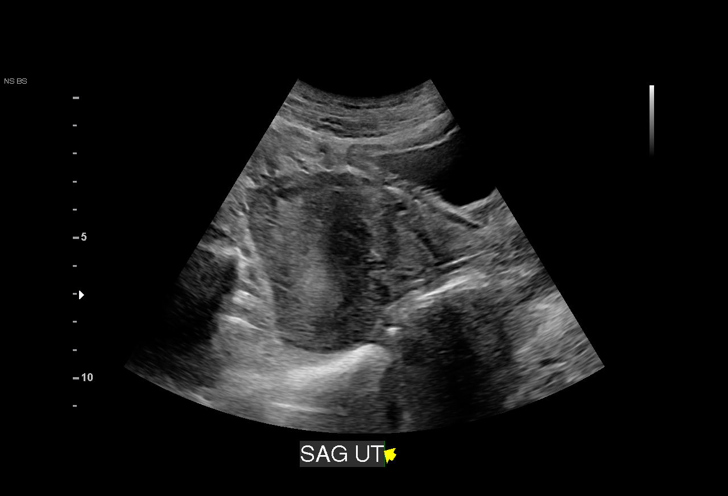
[im 4/48]
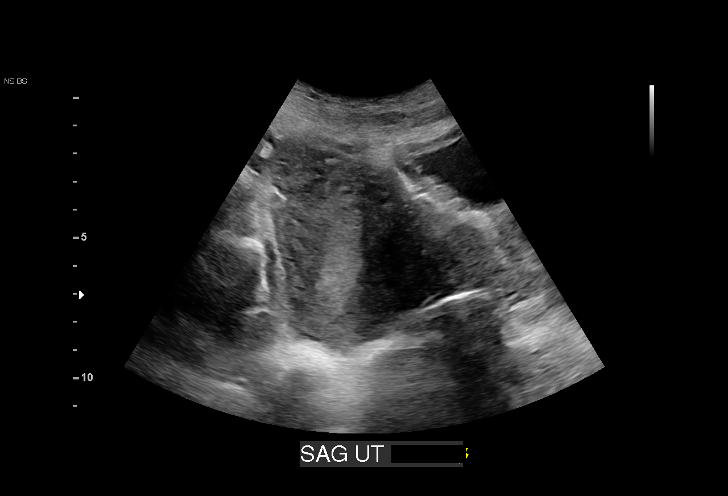
[im 7/48]
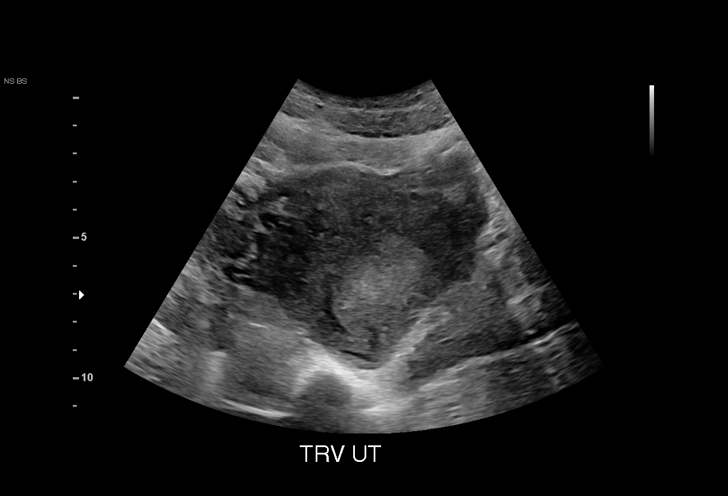
[im 11/48]
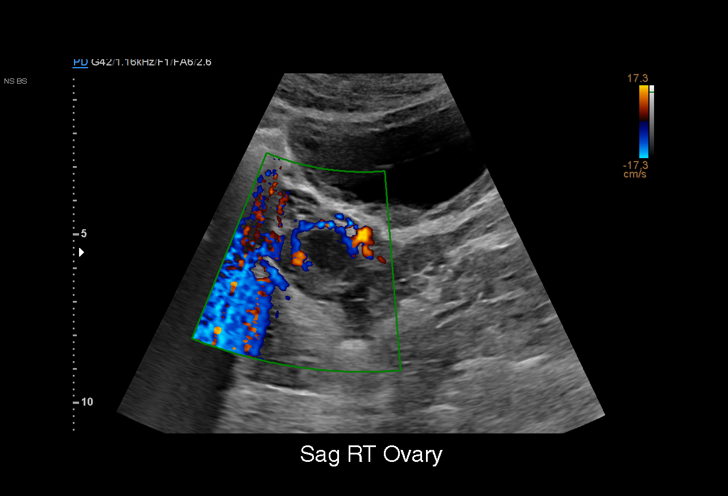
[im 14/48]
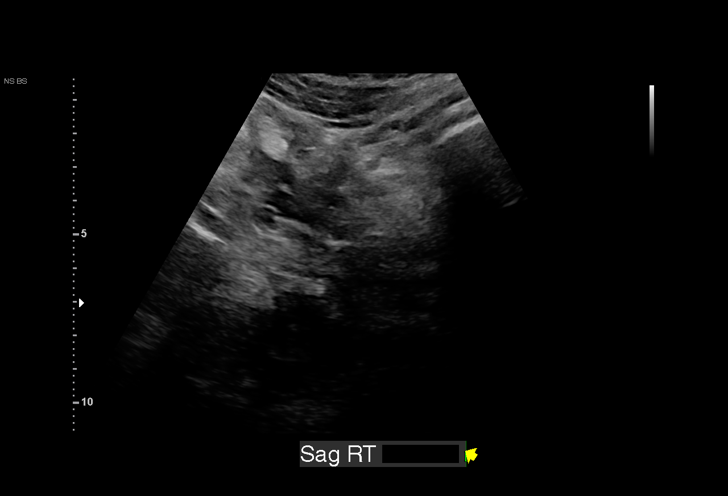
[im 18/48]
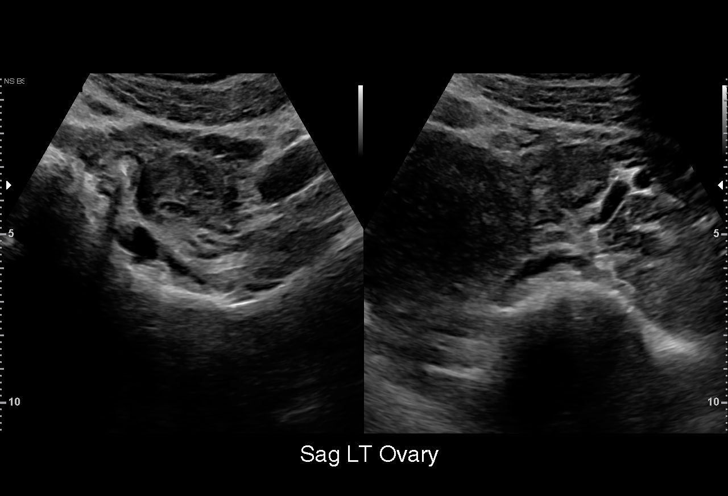
[im 21/48]
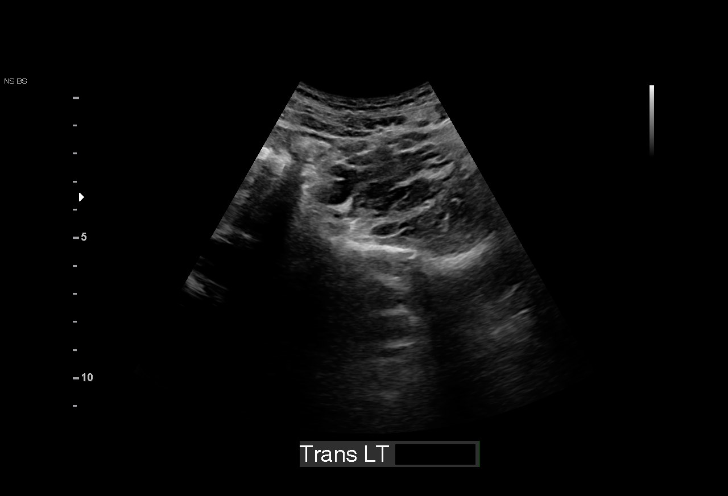
[im 25/48]
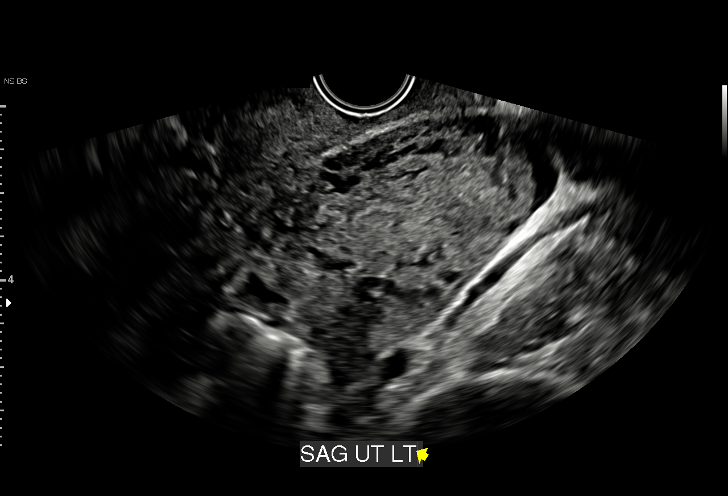
[im 27/48]
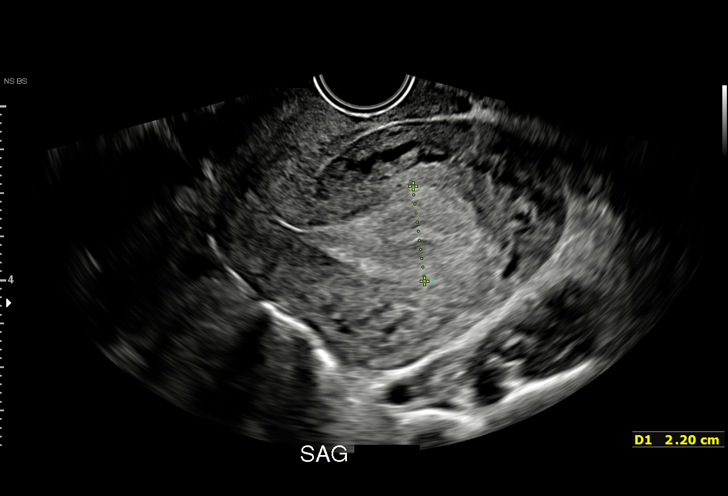
[im 30/48]
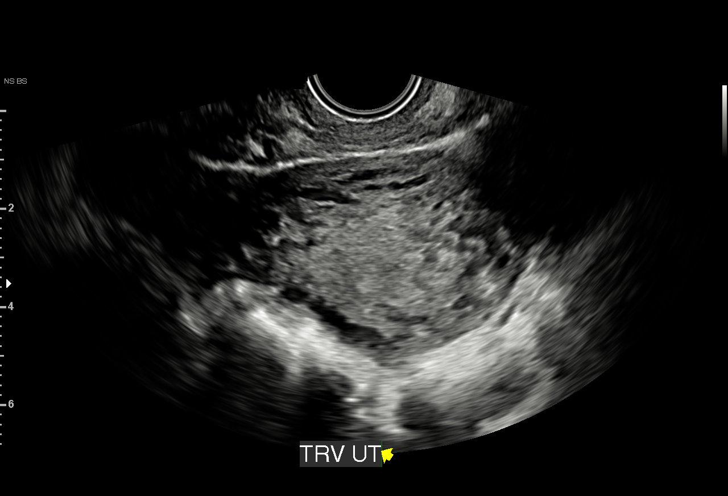
[im 34/48]
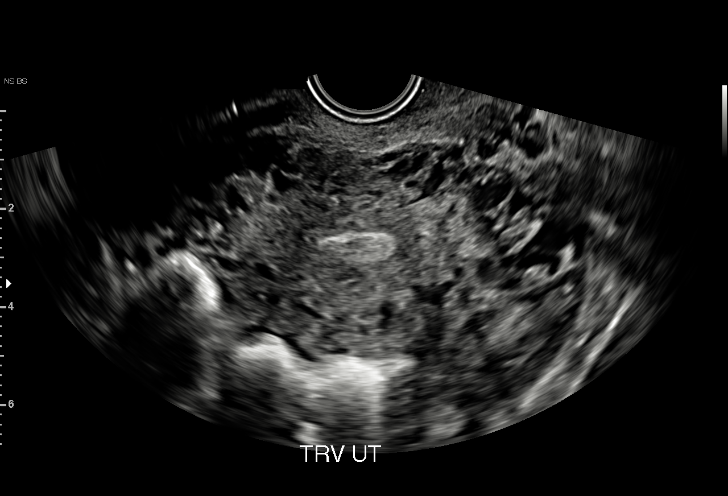
[im 37/48]
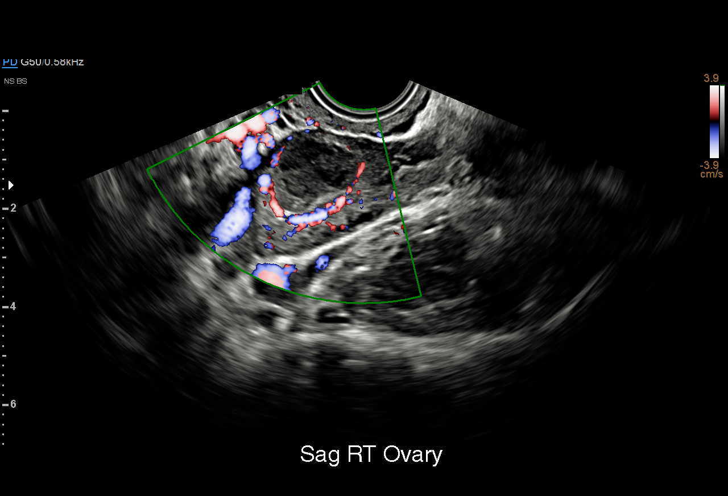
[im 41/48]
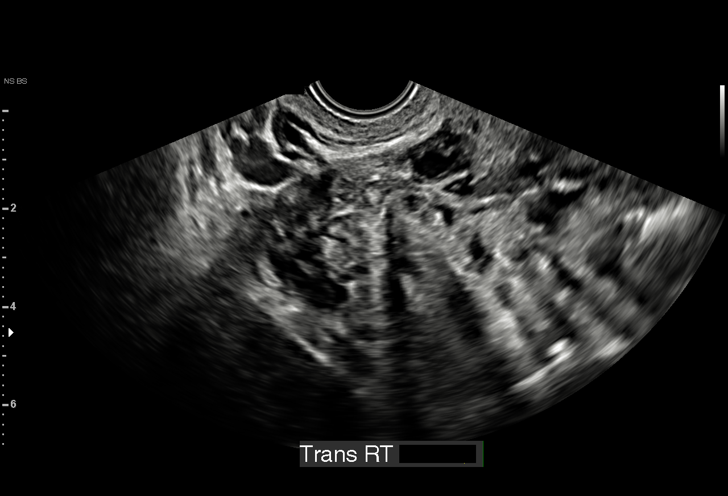
[im 44/48]
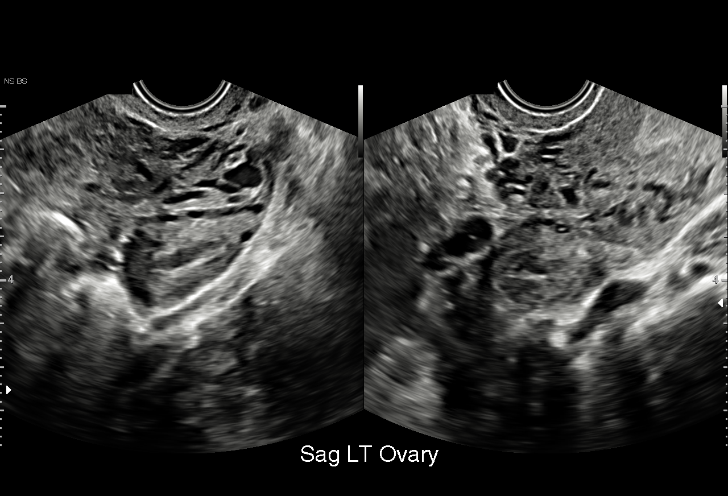
[im 48/48]
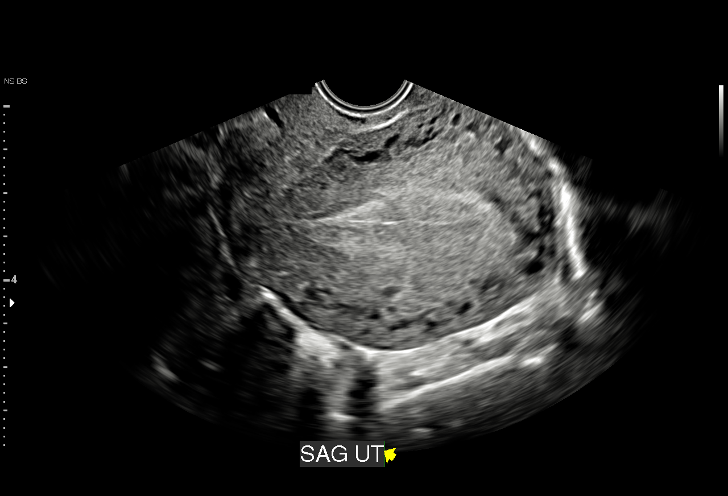

[15 of 28 positions shown; findings below may reference images not displayed]

FINDINGS: The uterus is retroflexed and appears unremarkable.

The endometrium measures 2 cm in thickness and appears unremarkable.
No intrauterine pregnancy identified.

The ovaries are unremarkable. There is a corpus luteum in the right
ovary.

No significant free fluid in the pelvis.
IMPRESSION: No intrauterine pregnancy identified and no suspicious adnexal
masses noted. Findings consistent with pregnancy of unknown location
and differential diagnosis includes: An early IUP, recent
spontaneous miscarriage, or an occult ectopic pregnancy. Clinical
correlation and follow-up with serial HCG levels and repeat
ultrasound in 7-11 days, or earlier if clinically indicated,
recommended.

## 2023-06-05 IMAGING — US US OB < 14 WEEKS - US OB TV
1 series · 15 of 26 positions shown · non-contrast
Comparison: None.

CLINICAL DATA: Vaginal bleeding, pain

EXAM:
OBSTETRIC <14 WK US AND TRANSVAGINAL OB US
TECHNIQUE: Both transabdominal and transvaginal ultrasound examinations were
performed for complete evaluation of the gestation as well as the
maternal uterus, adnexal regions, and pelvic cul-de-sac.
Transvaginal technique was performed to assess early pregnancy.

[Series 1: us ob < 14 weeks - us ob tv · 26 acquisitions, 15 frames shown]
[im 1/26]
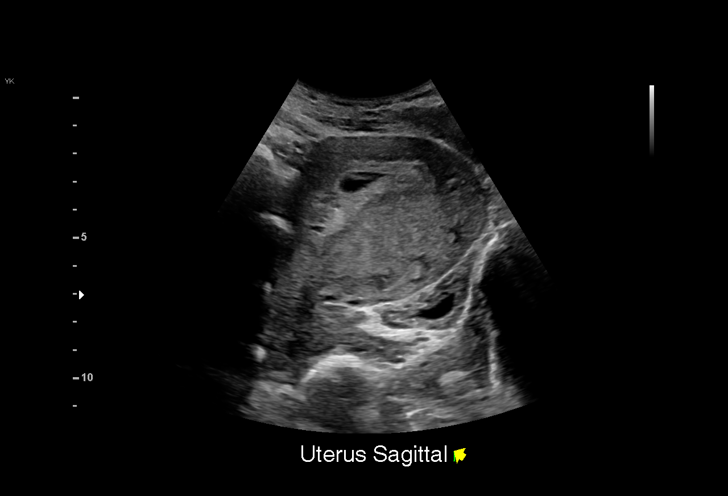
[im 3/26]
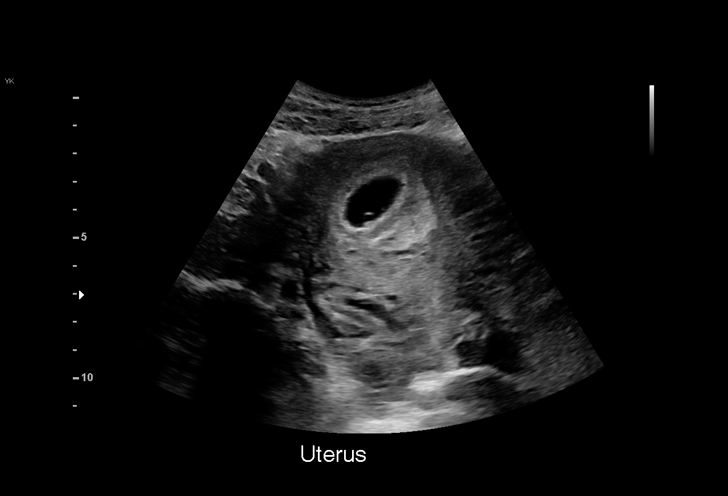
[im 5/26]
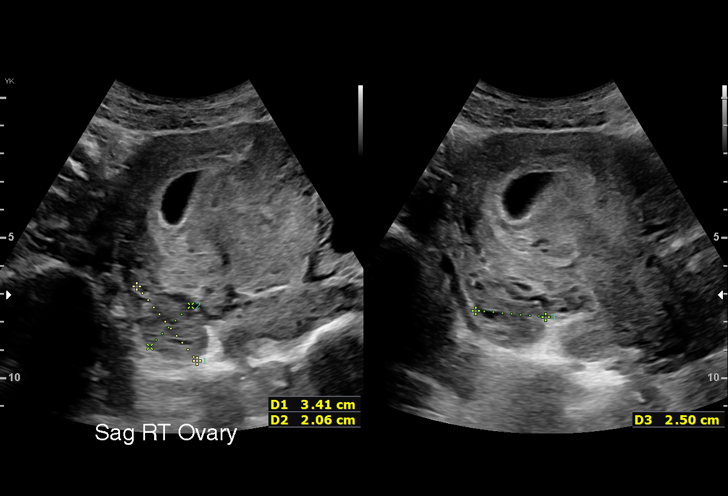
[im 7/26]
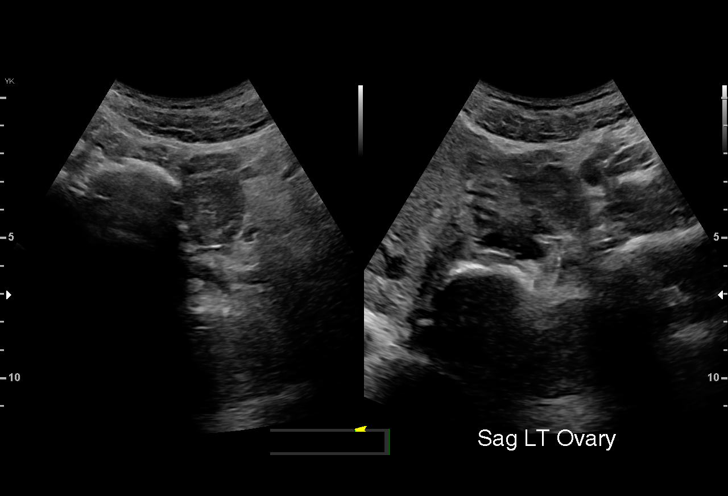
[im 8/26]
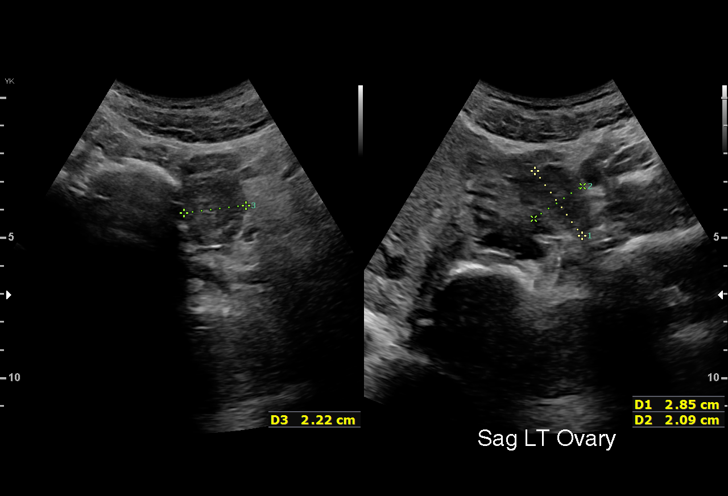
[im 10/26]
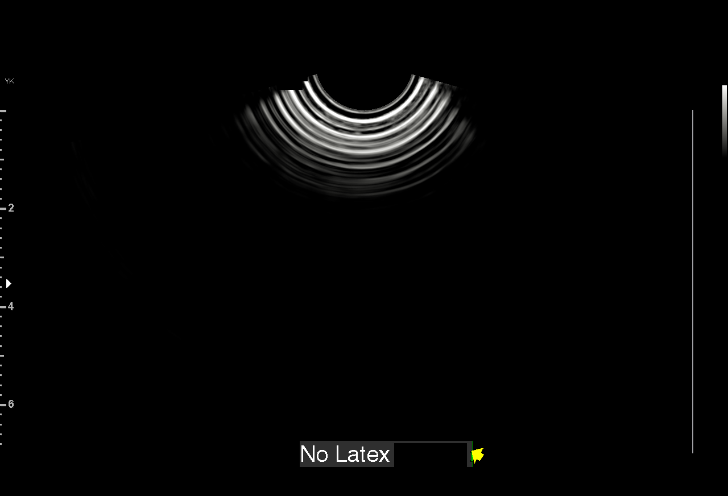
[im 12/26]
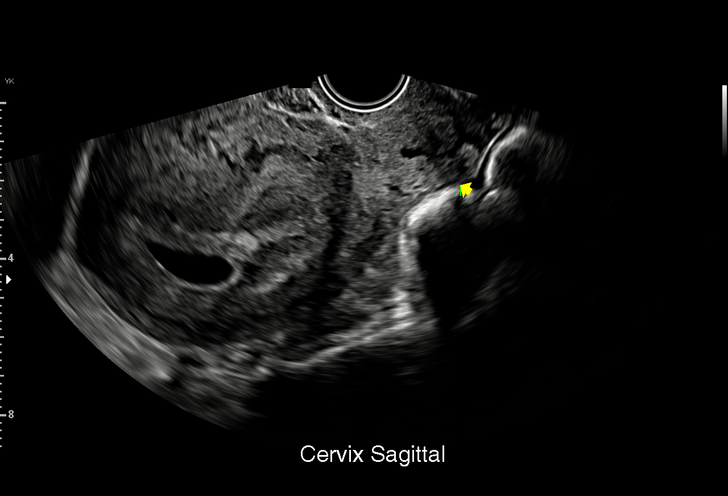
[im 14/26]
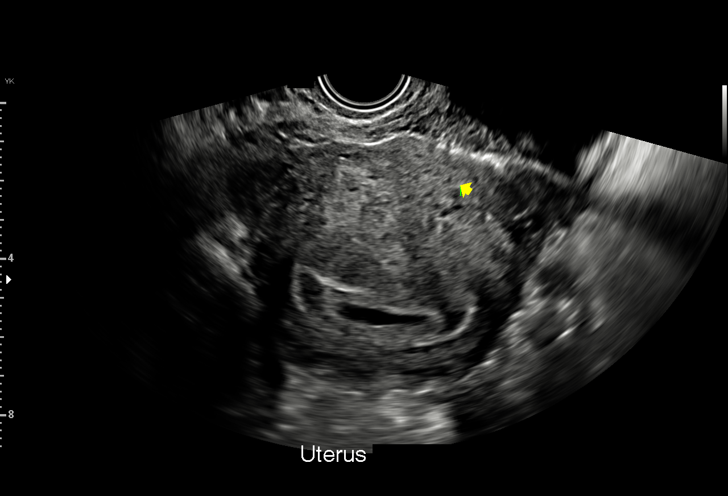
[im 15/26]
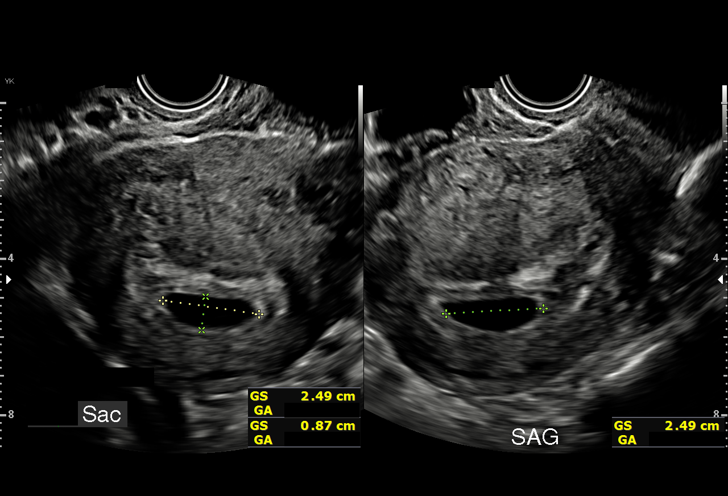
[im 17/26]
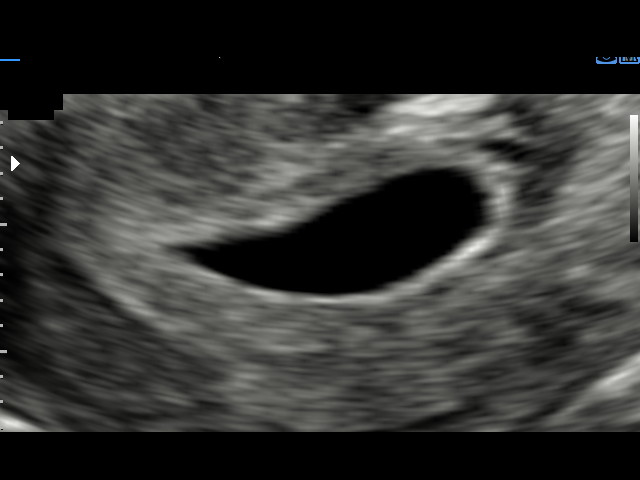
[im 19/26]
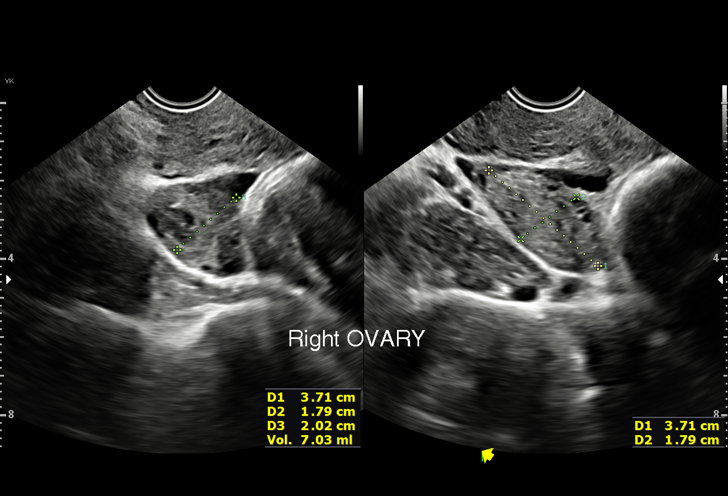
[im 20/26]
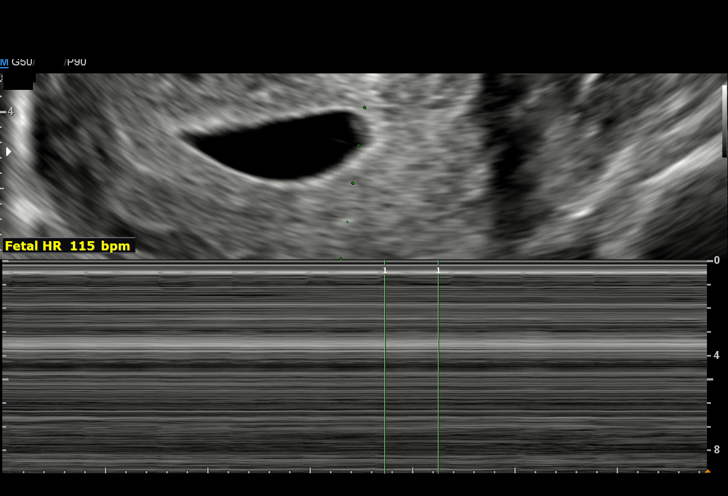
[im 22/26]
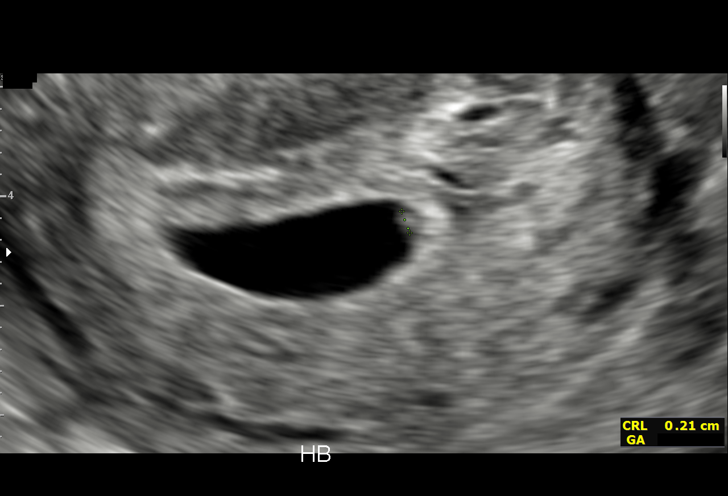
[im 24/26]
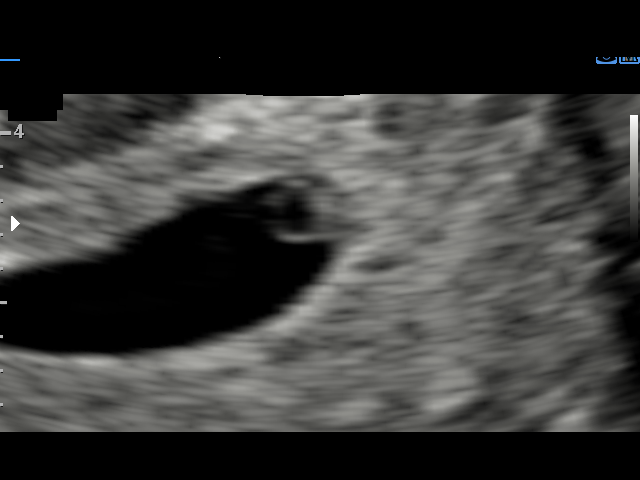
[im 26/26]
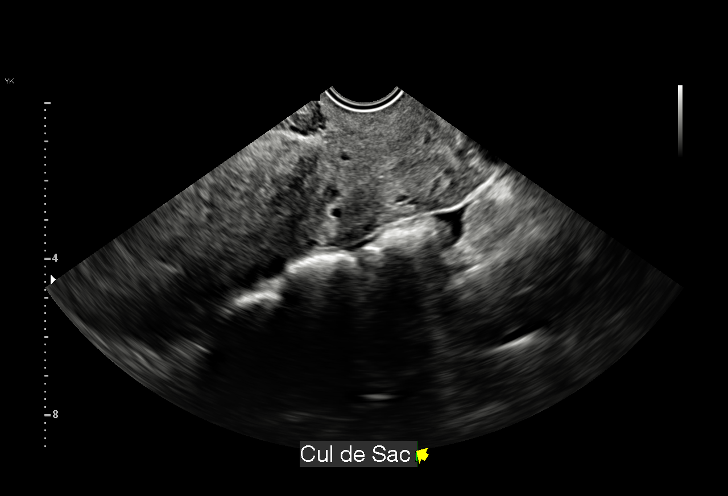

[15 of 26 positions shown; findings below may reference images not displayed]

FINDINGS: Intrauterine gestational sac: Single

Yolk sac:  Visualized.

Embryo:  Visualized.

Cardiac Activity: Visualized.

Heart Rate: 115 bpm

MSD: 19.5 mm   6 w   6 d

CRL:  2.0 mm   too early to estimate by crown-rump length

Subchorionic hemorrhage:  None visualized.

Maternal uterus/adnexae:
IMPRESSION: Early intrauterine gestation. Fetal pole is seen with fetal heart
rate 115 beats per minute. Fetal pole too small to accurately date
at this time. Approximate gestational age 6 weeks 6 days by mean sac
diameter. No acute complicating feature.
